# Patient Record
Sex: Male | Born: 1950 | Race: Black or African American | Hispanic: No | Marital: Married | Smoking: Current every day smoker
Health system: Southern US, Community
[De-identification: ages and names within clinical notes are randomized; demographics above are authoritative.]

## PROBLEM LIST (undated history)

## (undated) DIAGNOSIS — M109 Gout, unspecified: Secondary | ICD-10-CM

## (undated) DIAGNOSIS — I10 Essential (primary) hypertension: Secondary | ICD-10-CM

## (undated) DIAGNOSIS — E119 Type 2 diabetes mellitus without complications: Secondary | ICD-10-CM

## (undated) HISTORY — DX: Gout, unspecified: M10.9

## (undated) HISTORY — DX: Type 2 diabetes mellitus without complications: E11.9

## (undated) HISTORY — PX: OTHER SURGICAL HISTORY: SHX169

---

## 2008-04-05 HISTORY — PX: CAROTID STENT: SHX1301

## 2009-10-14 ENCOUNTER — Ambulatory Visit: Payer: Self-pay | Admitting: Urology

## 2009-10-27 ENCOUNTER — Ambulatory Visit: Payer: Self-pay | Admitting: Urology

## 2010-08-27 ENCOUNTER — Ambulatory Visit: Payer: Self-pay | Admitting: Gastroenterology

## 2010-09-02 ENCOUNTER — Ambulatory Visit: Payer: Self-pay | Admitting: Gastroenterology

## 2010-10-22 ENCOUNTER — Ambulatory Visit: Payer: Self-pay

## 2010-11-22 ENCOUNTER — Emergency Department: Payer: Self-pay | Admitting: *Deleted

## 2011-08-12 ENCOUNTER — Ambulatory Visit: Payer: Self-pay | Admitting: Internal Medicine

## 2011-10-12 ENCOUNTER — Ambulatory Visit: Payer: Self-pay | Admitting: Specialist

## 2012-03-07 ENCOUNTER — Ambulatory Visit: Payer: Self-pay | Admitting: Specialist

## 2012-03-15 ENCOUNTER — Ambulatory Visit: Payer: Self-pay | Admitting: Specialist

## 2014-02-04 ENCOUNTER — Ambulatory Visit: Payer: Self-pay

## 2014-03-27 DIAGNOSIS — M179 Osteoarthritis of knee, unspecified: Secondary | ICD-10-CM | POA: Insufficient documentation

## 2014-03-27 DIAGNOSIS — M1710 Unilateral primary osteoarthritis, unspecified knee: Secondary | ICD-10-CM | POA: Insufficient documentation

## 2014-04-30 ENCOUNTER — Ambulatory Visit: Payer: Self-pay | Admitting: Urgent Care

## 2014-04-30 LAB — CBC WITH DIFFERENTIAL/PLATELET
BASOS ABS: 0 10*3/uL (ref 0.0–0.1)
Basophil %: 0.4 %
EOS ABS: 0.1 10*3/uL (ref 0.0–0.7)
Eosinophil %: 1.4 %
HCT: 39.6 % — ABNORMAL LOW (ref 40.0–52.0)
HGB: 13 g/dL (ref 13.0–18.0)
LYMPHS PCT: 25.5 %
Lymphocyte #: 1.8 10*3/uL (ref 1.0–3.6)
MCH: 30.8 pg (ref 26.0–34.0)
MCHC: 32.8 g/dL (ref 32.0–36.0)
MCV: 94 fL (ref 80–100)
Monocyte #: 0.6 x10 3/mm (ref 0.2–1.0)
Monocyte %: 8 %
NEUTROS ABS: 4.5 10*3/uL (ref 1.4–6.5)
NEUTROS PCT: 64.7 %
PLATELETS: 127 10*3/uL — AB (ref 150–440)
RBC: 4.21 10*6/uL — ABNORMAL LOW (ref 4.40–5.90)
RDW: 13.1 % (ref 11.5–14.5)
WBC: 7 10*3/uL (ref 3.8–10.6)

## 2014-04-30 LAB — HEPATIC FUNCTION PANEL A (ARMC)
ALBUMIN: 3.6 g/dL (ref 3.4–5.0)
ALK PHOS: 76 U/L (ref 46–116)
Bilirubin, Direct: 0.1 mg/dL (ref 0.0–0.2)
Bilirubin,Total: 0.8 mg/dL (ref 0.2–1.0)
SGOT(AST): 31 U/L (ref 15–37)
SGPT (ALT): 41 U/L (ref 14–63)
TOTAL PROTEIN: 7.3 g/dL (ref 6.4–8.2)

## 2014-04-30 LAB — PROTIME-INR
INR: 1
PROTHROMBIN TIME: 13 s (ref 11.5–14.7)

## 2014-04-30 LAB — LIPASE, BLOOD: Lipase: 79 U/L (ref 73–393)

## 2014-05-01 LAB — CANCER ANTIGEN 19-9: CA 19-9: 31 U/mL (ref 0–35)

## 2014-05-07 ENCOUNTER — Ambulatory Visit: Payer: Self-pay | Admitting: Urgent Care

## 2014-06-11 DIAGNOSIS — K219 Gastro-esophageal reflux disease without esophagitis: Secondary | ICD-10-CM | POA: Diagnosis not present

## 2014-06-11 DIAGNOSIS — D126 Benign neoplasm of colon, unspecified: Secondary | ICD-10-CM | POA: Diagnosis not present

## 2014-06-11 DIAGNOSIS — K862 Cyst of pancreas: Secondary | ICD-10-CM | POA: Diagnosis not present

## 2014-06-17 DIAGNOSIS — M25531 Pain in right wrist: Secondary | ICD-10-CM | POA: Diagnosis not present

## 2014-07-23 ENCOUNTER — Ambulatory Visit
Admit: 2014-07-23 | Disposition: A | Discharge status: Home / Self Care | Payer: Self-pay | Attending: Gastroenterology | Admitting: Gastroenterology

## 2014-07-23 DIAGNOSIS — I10 Essential (primary) hypertension: Secondary | ICD-10-CM | POA: Diagnosis not present

## 2014-07-23 DIAGNOSIS — K635 Polyp of colon: Secondary | ICD-10-CM | POA: Diagnosis not present

## 2014-07-23 DIAGNOSIS — K641 Second degree hemorrhoids: Secondary | ICD-10-CM | POA: Diagnosis not present

## 2014-07-23 DIAGNOSIS — D123 Benign neoplasm of transverse colon: Secondary | ICD-10-CM | POA: Diagnosis not present

## 2014-07-23 DIAGNOSIS — F17210 Nicotine dependence, cigarettes, uncomplicated: Secondary | ICD-10-CM | POA: Diagnosis not present

## 2014-07-23 DIAGNOSIS — D125 Benign neoplasm of sigmoid colon: Secondary | ICD-10-CM | POA: Diagnosis not present

## 2014-07-23 DIAGNOSIS — K219 Gastro-esophageal reflux disease without esophagitis: Secondary | ICD-10-CM | POA: Diagnosis not present

## 2014-07-23 DIAGNOSIS — D120 Benign neoplasm of cecum: Secondary | ICD-10-CM | POA: Diagnosis not present

## 2014-07-23 DIAGNOSIS — E119 Type 2 diabetes mellitus without complications: Secondary | ICD-10-CM | POA: Diagnosis not present

## 2014-07-23 DIAGNOSIS — R12 Heartburn: Secondary | ICD-10-CM | POA: Diagnosis not present

## 2014-07-23 DIAGNOSIS — Z86010 Personal history of colonic polyps: Secondary | ICD-10-CM | POA: Diagnosis not present

## 2014-07-23 NOTE — Op Note (Signed)
PATIENT NAME:  Donald Scott, Donald Scott MR#:  [id number] DATE OF BIRTH:  05-Aug-1950  DATE OF PROCEDURE:  03/15/2012  PREOPERATIVE DIAGNOSES:  1. Severe impingement syndrome, right shoulder.  2. Severe right acromioclavicular arthritis. 3. Thinning of the rotator cuff. 4. Fraying of the glenoid labrum upper.   POSTOPERATIVE DIAGNOSES:  1. Severe impingement syndrome, right shoulder.  2. Severe right acromioclavicular arthritis. 3. Thinning of the rotator cuff. 4. Fraying of the glenoid labrum upper.   PROCEDURES: 1. Arthroscopic right distal clavicle excision.  2. Arthroscopic subacromial decompression and partial acromioplasty and extensive bursectomy.  3. Debridement of glenoid labrum   SURGEON: Earnestine Leys, M.D.   ANESTHESIA: General endotracheal.   COMPLICATIONS: None.   DRAINS: None.   ESTIMATED BLOOD LOSS: Minimal.   REPLACEMENTS: None.   OPERATIVE FINDINGS: The patient had very thick advanced bursitis in the subacromial bursa. He had very prominent anterior downsloping, anterior acromion, and very prominent spurs off the undersurface of the clavicle. There was thinning of the rotator cuff from the dorsal surface. From the volar surface, there was liftoff of about 2 to 3 mm of the cuff from the glenoid margin, but no fraying of the cuff at all, no retraction. The biceps tendon was intact. The glenohumeral joint was intact. The labrum was frayed in multiple places.   DESCRIPTION OF PROCEDURE: The patient was brought to the Operating Room where he underwent satisfactory general endotracheal anesthesia, in the supine position. The patient refused an interscalene block. He was turned in the left lateral decubitus position and padded appropriately on the beanbag. Arthroscopy was carried out from a posterior portal with accessory portals laterally and anteriorly. The above findings were encountered upon entering the subacromial joint, which was debrided through the lateral portal with a  motorized shaver. The cuff was seen to be thinned on the dorsal surface, but there was no obvious tear. The arthroscope was redirected into the glenohumeral joint and the above findings were noted. There was slight exposure of the tuberosity bone just lateral to the articular surface of 2 to 3 mm, but there did not appear to be any fraying or significant damage to the tendon otherwise.Long head of the biceps was intact. The spinal needle was passed through this area to visualize it and there was no obvious tear. The only area of weakness and thinning was way anterior of the biceps. I decided not to do any further treatment to the cuff itself as a result. With the arthroscope in the subacromial space, the soft tissue was removed from the undersurface of the acromion and the clavicle. Coracoacromial ligament was released. The bur was introduced from the posterior portal and anterior acromioplasty carried out along and from the anterior portal the bur was used to remove the distal 8 to 10 mm of the clavicle. Extensive bursectomy and soft tissue debridement was carried out. Once this was all complete, the space was widely opened and the cuff remained intact. Thorough wash-out was carried out. Instruments were removed and stab wounds were closed with 3-0 nylon suture. 0.5% Marcaine with epinephrine and morphine was placed in the joint, in the bursa. TENS pads and a dry sterile dressing were applied. A sling was applied. The patient was awakened and taken to recovery in good condition.  ____________________________ Park Breed, MD hem:slb D: 03/15/2012 14:06:58 ET T: 03/15/2012 14:36:52 ET JOB#: [id number]  cc: Park Breed, MD, <Dictator> Park Breed MD ELECTRONICALLY SIGNED 03/15/2012 16:39

## 2014-07-29 LAB — SURGICAL PATHOLOGY

## 2014-08-22 DIAGNOSIS — R531 Weakness: Secondary | ICD-10-CM | POA: Diagnosis not present

## 2014-08-22 DIAGNOSIS — M25511 Pain in right shoulder: Secondary | ICD-10-CM | POA: Diagnosis not present

## 2014-08-22 DIAGNOSIS — I10 Essential (primary) hypertension: Secondary | ICD-10-CM | POA: Diagnosis not present

## 2014-08-22 DIAGNOSIS — M79601 Pain in right arm: Secondary | ICD-10-CM | POA: Diagnosis not present

## 2014-08-22 DIAGNOSIS — E119 Type 2 diabetes mellitus without complications: Secondary | ICD-10-CM | POA: Diagnosis not present

## 2014-08-22 DIAGNOSIS — Z79891 Long term (current) use of opiate analgesic: Secondary | ICD-10-CM | POA: Diagnosis not present

## 2014-09-05 DIAGNOSIS — M79601 Pain in right arm: Secondary | ICD-10-CM | POA: Diagnosis not present

## 2014-09-08 ENCOUNTER — Encounter: Payer: Self-pay | Admitting: Urgent Care

## 2014-09-08 NOTE — Telephone Encounter (Signed)
A user error has taken place: encounter opened in error, closed for administrative reasons.

## 2014-09-12 DIAGNOSIS — I6529 Occlusion and stenosis of unspecified carotid artery: Secondary | ICD-10-CM | POA: Diagnosis not present

## 2014-09-23 DIAGNOSIS — Z6826 Body mass index (BMI) 26.0-26.9, adult: Secondary | ICD-10-CM | POA: Diagnosis not present

## 2014-09-23 DIAGNOSIS — F17200 Nicotine dependence, unspecified, uncomplicated: Secondary | ICD-10-CM | POA: Diagnosis not present

## 2014-09-23 DIAGNOSIS — Z820 Family history of epilepsy and other diseases of the nervous system: Secondary | ICD-10-CM | POA: Diagnosis not present

## 2014-09-23 DIAGNOSIS — K219 Gastro-esophageal reflux disease without esophagitis: Secondary | ICD-10-CM | POA: Diagnosis not present

## 2014-09-23 DIAGNOSIS — M25511 Pain in right shoulder: Secondary | ICD-10-CM | POA: Diagnosis not present

## 2014-09-23 DIAGNOSIS — E119 Type 2 diabetes mellitus without complications: Secondary | ICD-10-CM | POA: Diagnosis not present

## 2014-09-23 DIAGNOSIS — Z9889 Other specified postprocedural states: Secondary | ICD-10-CM | POA: Diagnosis not present

## 2014-09-23 DIAGNOSIS — M79601 Pain in right arm: Secondary | ICD-10-CM | POA: Diagnosis not present

## 2014-09-23 DIAGNOSIS — I10 Essential (primary) hypertension: Secondary | ICD-10-CM | POA: Diagnosis not present

## 2014-09-23 DIAGNOSIS — M25531 Pain in right wrist: Secondary | ICD-10-CM | POA: Diagnosis not present

## 2014-09-26 DIAGNOSIS — F17210 Nicotine dependence, cigarettes, uncomplicated: Secondary | ICD-10-CM | POA: Diagnosis not present

## 2014-09-26 DIAGNOSIS — M25511 Pain in right shoulder: Secondary | ICD-10-CM | POA: Diagnosis not present

## 2014-09-26 DIAGNOSIS — I6529 Occlusion and stenosis of unspecified carotid artery: Secondary | ICD-10-CM | POA: Diagnosis not present

## 2014-09-26 DIAGNOSIS — M79601 Pain in right arm: Secondary | ICD-10-CM | POA: Diagnosis not present

## 2014-09-26 DIAGNOSIS — I10 Essential (primary) hypertension: Secondary | ICD-10-CM | POA: Diagnosis not present

## 2014-10-21 DIAGNOSIS — M25511 Pain in right shoulder: Secondary | ICD-10-CM | POA: Diagnosis not present

## 2014-10-21 DIAGNOSIS — M5410 Radiculopathy, site unspecified: Secondary | ICD-10-CM | POA: Diagnosis not present

## 2014-10-21 DIAGNOSIS — G549 Nerve root and plexus disorder, unspecified: Secondary | ICD-10-CM | POA: Insufficient documentation

## 2014-12-30 DIAGNOSIS — M79601 Pain in right arm: Secondary | ICD-10-CM | POA: Diagnosis not present

## 2014-12-30 DIAGNOSIS — Z23 Encounter for immunization: Secondary | ICD-10-CM | POA: Diagnosis not present

## 2014-12-30 DIAGNOSIS — K861 Other chronic pancreatitis: Secondary | ICD-10-CM | POA: Diagnosis not present

## 2014-12-30 DIAGNOSIS — I10 Essential (primary) hypertension: Secondary | ICD-10-CM | POA: Diagnosis not present

## 2014-12-30 DIAGNOSIS — F17210 Nicotine dependence, cigarettes, uncomplicated: Secondary | ICD-10-CM | POA: Diagnosis not present

## 2014-12-30 DIAGNOSIS — E119 Type 2 diabetes mellitus without complications: Secondary | ICD-10-CM | POA: Diagnosis not present

## 2014-12-30 DIAGNOSIS — M25511 Pain in right shoulder: Secondary | ICD-10-CM | POA: Diagnosis not present

## 2015-02-13 DIAGNOSIS — E119 Type 2 diabetes mellitus without complications: Secondary | ICD-10-CM | POA: Diagnosis not present

## 2015-04-01 DIAGNOSIS — E119 Type 2 diabetes mellitus without complications: Secondary | ICD-10-CM | POA: Diagnosis not present

## 2015-04-01 DIAGNOSIS — I10 Essential (primary) hypertension: Secondary | ICD-10-CM | POA: Diagnosis not present

## 2015-04-01 DIAGNOSIS — K861 Other chronic pancreatitis: Secondary | ICD-10-CM | POA: Diagnosis not present

## 2015-04-01 DIAGNOSIS — Z0001 Encounter for general adult medical examination with abnormal findings: Secondary | ICD-10-CM | POA: Diagnosis not present

## 2015-04-01 DIAGNOSIS — J019 Acute sinusitis, unspecified: Secondary | ICD-10-CM | POA: Diagnosis not present

## 2015-04-01 DIAGNOSIS — F17210 Nicotine dependence, cigarettes, uncomplicated: Secondary | ICD-10-CM | POA: Diagnosis not present

## 2015-05-01 ENCOUNTER — Ambulatory Visit: Payer: Self-pay | Admitting: Gastroenterology

## 2015-05-21 ENCOUNTER — Emergency Department
Admission: EM | Admit: 2015-05-21 | Discharge: 2015-05-21 | Disposition: A | Discharge status: Home / Self Care | Payer: Commercial Managed Care - HMO | Attending: Emergency Medicine | Admitting: Emergency Medicine

## 2015-05-21 ENCOUNTER — Emergency Department: Payer: Commercial Managed Care - HMO

## 2015-05-21 ENCOUNTER — Encounter: Payer: Self-pay | Admitting: *Deleted

## 2015-05-21 DIAGNOSIS — E119 Type 2 diabetes mellitus without complications: Secondary | ICD-10-CM | POA: Diagnosis not present

## 2015-05-21 DIAGNOSIS — Z880 Allergy status to penicillin: Secondary | ICD-10-CM | POA: Insufficient documentation

## 2015-05-21 DIAGNOSIS — M79675 Pain in left toe(s): Secondary | ICD-10-CM | POA: Diagnosis not present

## 2015-05-21 DIAGNOSIS — M109 Gout, unspecified: Secondary | ICD-10-CM

## 2015-05-21 DIAGNOSIS — F17200 Nicotine dependence, unspecified, uncomplicated: Secondary | ICD-10-CM | POA: Insufficient documentation

## 2015-05-21 DIAGNOSIS — M10072 Idiopathic gout, left ankle and foot: Secondary | ICD-10-CM | POA: Insufficient documentation

## 2015-05-21 DIAGNOSIS — I10 Essential (primary) hypertension: Secondary | ICD-10-CM | POA: Diagnosis not present

## 2015-05-21 DIAGNOSIS — M79672 Pain in left foot: Secondary | ICD-10-CM | POA: Diagnosis present

## 2015-05-21 HISTORY — DX: Essential (primary) hypertension: I10

## 2015-05-21 MED ORDER — ETODOLAC 300 MG PO CAPS: 300.0000 mg | ORAL_CAPSULE | Freq: Three times a day (TID) | ORAL | Status: DC | PRN

## 2015-05-21 NOTE — ED Notes (Signed)
States left big toe pain, states he injured it several months ago and now feels like its dislocated, pt ambualtory to triage room

## 2015-05-21 NOTE — Discharge Instructions (Signed)

## 2015-05-21 NOTE — ED Provider Notes (Signed)
[facility] Emergency Department Provider Note  ____________________________________________  Time seen: Approximately 10:14 AM  I have reviewed the triage vital signs and the nursing notes.   HISTORY  Chief Complaint Foot Pain    HPI Donald Scott. is a 65 y.o. male, NAD, presents emergency Department with a few day history of pain in the left great toe. Notes some redness and swelling. Denies any recent injuries but is uncertain if he stubbed the toe some months ago. Has a history of gout but normally only presents in the right great toe. Denies any skin sores or open wounds. Has history of diabetes but states since he's lost a significant amount of weight he is no longer on medications. Denies numbness, weakness, tingling of the feet. Has not taken anything over-the-counter for his current symptoms.   Past Medical History  Diagnosis Date  . Hypertension     There are no active problems to display for this patient.   History reviewed. No pertinent past surgical history.  No current outpatient prescriptions on file.  Allergies Penicillins  History reviewed. No pertinent family history.  Social History Social History  Substance Use Topics  . Smoking status: Current Every Day Smoker  . Smokeless tobacco: None  . Alcohol Use: None     Review of Systems  Constitutional: No fever/chills, fatigue.  Eyes: No visual changes.  Cardiovascular: No chest pain. Respiratory: No cough. No shortness of breath. No wheezing.  Gastrointestinal: No abdominal pain.  No nausea, vomiting. Musculoskeletal: Positive for left great toe pain. Negative for back pain.  Skin: Positive for redness and swelling of left great toe. Negative for rash. Neurological: Negative for headaches, focal weakness or numbness. 10-point ROS otherwise negative.  ____________________________________________   PHYSICAL EXAM:  VITAL SIGNS: ED Triage Vitals  Enc Vitals Group     BP 05/21/15 0943 192/93 mmHg     Pulse Rate 05/21/15 0943 78     Resp 05/21/15 0943 18     Temp 05/21/15 0943 98 F (36.7 C)     Temp Source 05/21/15 0943 Oral     SpO2 05/21/15 0943 99 %     Weight 05/21/15 0943 160 lb (72.576 kg)     Height 05/21/15 0943 5\' 7"  (1.702 m)     Head Cir --      Peak Flow --      Pain Score 05/21/15 0944 8     Pain Loc --      Pain Edu? --      Excl. in [place]? --     Constitutional: Alert and oriented. Well appearing and in no acute distress. Eyes: Conjunctivae are normal.  Head: Atraumatic. Cardiovascular: Normal rate, regular rhythm. Normal S1 and S2.  Good peripheral circulation. Respiratory: Normal respiratory effort without tachypnea or retractions. Lungs CTAB. Musculoskeletal: Pain to palpation about the left right toe MTP. No lower extremity edema.  No joint effusions. Neurologic:  Normal speech and language. No gross focal neurologic deficits are appreciated.  Skin:  Skin about the left great toe MTP erythematous, swollen, warm. No skin lesions noted. No open wounds, oozing, weeping.  Psychiatric: Mood and affect are normal. Speech and behavior are normal. Patient exhibits appropriate insight and judgement.   ____________________________________________   LABS  None   ____________________________________________  EKG  None ____________________________________________  RADIOLOGY I have personally viewed and evaluated these images (plain radiographs) as part of my medical decision making, as well as reviewing the written report by the radiologist.  Dg Toe Great Left  05/21/2015  CLINICAL DATA:  Left great toe pain.  Stubbed toe 3-4 months ago. EXAM: LEFT GREAT TOE COMPARISON:  None. FINDINGS: There is no evidence of fracture or dislocation. There is mild osteoarthritis of the first MTP joint. Soft tissues are unremarkable. IMPRESSION: No acute osseous injury of the left great toe. Electronically Signed   By: Kathreen Devoid   On:  05/21/2015 10:41    ____________________________________________    PROCEDURES  Procedure(s) performed: None    Medications - No data to display   ____________________________________________   INITIAL IMPRESSION / ASSESSMENT AND PLAN / ED COURSE  Pertinent imaging results that were available during my care of the patient were reviewed by me and considered in my medical decision making (see chart for details).  Patient's diagnosis is consistent with acute gouty arthritis. Patient will be discharged home with prescriptions for etodolac 300 mg capsules take 1 capsule by mouth 3 times daily with meals as needed for inflammation and pain. Would've liked to have given colchicine and indomethacin but unfortunately those medications were not covered by his current insurance plan. Patient has lab orders to take to lab for draw for his PCP. Advise the patient to give a call to his primary care provider to add uric acid for recheck. Patient is to follow up with her memory care provider if symptoms persist past this treatment course. Patient is given ED precautions to return to the ED for any worsening or new symptoms.    ____________________________________________  FINAL CLINICAL IMPRESSION(S) / ED DIAGNOSES  Final diagnoses:  Acute gout of left foot, unspecified cause      NEW MEDICATIONS STARTED DURING THIS VISIT:  New Prescriptions   No medications on file         Braxton Feathers, PA-C 05/21/15 1132  Eula Listen, MD 05/21/15 1541

## 2015-05-22 ENCOUNTER — Encounter: Payer: Self-pay | Admitting: Gastroenterology

## 2015-05-22 ENCOUNTER — Ambulatory Visit (INDEPENDENT_AMBULATORY_CARE_PROVIDER_SITE_OTHER): Payer: Commercial Managed Care - HMO | Admitting: Gastroenterology

## 2015-05-22 ENCOUNTER — Other Ambulatory Visit: Payer: Self-pay

## 2015-05-22 VITALS — BP 162/95 | HR 66 | Temp 98.4°F | Ht 67.0 in | Wt 173.0 lb

## 2015-05-22 DIAGNOSIS — M109 Gout, unspecified: Secondary | ICD-10-CM

## 2015-05-22 DIAGNOSIS — I10 Essential (primary) hypertension: Secondary | ICD-10-CM | POA: Insufficient documentation

## 2015-05-22 DIAGNOSIS — E119 Type 2 diabetes mellitus without complications: Secondary | ICD-10-CM

## 2015-05-22 DIAGNOSIS — M1000 Idiopathic gout, unspecified site: Secondary | ICD-10-CM

## 2015-05-22 DIAGNOSIS — E1121 Type 2 diabetes mellitus with diabetic nephropathy: Secondary | ICD-10-CM | POA: Insufficient documentation

## 2015-05-22 DIAGNOSIS — K861 Other chronic pancreatitis: Secondary | ICD-10-CM

## 2015-05-22 HISTORY — DX: Type 2 diabetes mellitus without complications: E11.9

## 2015-05-22 HISTORY — DX: Gout, unspecified: M10.9

## 2015-05-22 NOTE — Progress Notes (Signed)
Primary Care Physician: Ronnell Freshwater, NP  Primary Gastroenterologist:  Dr. Lucilla Lame  Chief Complaint  Patient presents with  . Follow up pancreatitis    HPI: Donald Scott. is a 65 y.o. male here with a history of chronic pancreatitis. 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 The patient now reports that he drinks approximately a 12 pack of beer a week. He was taking Creon 3 times a day but states he was getting constipated so he went down to twice a day. Since then the patient states he has been having 4 formed stools a day without any diarrhea. The patient denies any abdominal pain.  Current Outpatient Prescriptions  Medication Sig Dispense Refill  . amLODipine (NORVASC) 5 MG tablet     . aspirin 81 MG tablet Take by mouth.    . bisoprolol-hydrochlorothiazide (ZIAC) 5-6.25 MG tablet TK 1 T PO QD  5  . carvedilol (COREG) 6.25 MG tablet Take by mouth.    . enalapril (VASOTEC) 20 MG tablet Take by mouth.    . etodolac (LODINE) 300 MG capsule Take 1 capsule (300 mg total) by mouth 3 (three) times daily with meals as needed for moderate pain. 21 capsule 0  . fluticasone (FLONASE) 50 MCG/ACT nasal spray SHAKE LQ AND U 1 TO 2 SPRAYS IEN D FOR RHINITIS  1  . gabapentin (NEURONTIN) 300 MG capsule Take by mouth.    . insulin NPH-regular Human (HUMULIN 70/30) (70-30) 100 UNIT/ML injection     . lipase/protease/amylase (CREON) 36000 UNITS CPEP capsule     . losartan (COZAAR) 25 MG tablet TK 1 T PO QHS FOR BP  4  . metFORMIN (GLUCOPHAGE) 500 MG tablet Take by mouth.    . nabumetone (RELAFEN) 500 MG tablet     . NIFEdipine (AFEDITAB CR) 30 MG 24 hr tablet Take by mouth.    Marland Kitchen NIFEdipine (PROCARDIA-XL/ADALAT-CC/NIFEDICAL-XL) 30 MG 24 hr tablet     . nortriptyline (PAMELOR) 25 MG capsule     . omeprazole (PRILOSEC) 40 MG capsule     . HYDROcodone-acetaminophen (VICODIN) 5-500 MG tablet Reported on 05/22/2015    .  oxyCODONE-acetaminophen (PERCOCET/ROXICET) 5-325 MG tablet Reported on 05/22/2015     No current facility-administered medications for this visit.    Allergies as of 05/22/2015 - Review Complete 05/22/2015  Allergen Reaction Noted  . Iodinated diagnostic agents Other (See Comments) 05/22/2015  . Other Hives, Other (See Comments), and Rash 05/22/2015  . Penicillin g Other (See Comments) 05/22/2015  . Penicillins  05/21/2015    ROS:  General: Negative for anorexia, weight loss, fever, chills, fatigue, weakness. ENT: Negative for hoarseness, difficulty swallowing , nasal congestion. CV: Negative for chest pain, angina, palpitations, dyspnea on exertion, peripheral edema.  Respiratory: Negative for dyspnea at rest, dyspnea on exertion, cough, sputum, wheezing.  GI: See history of present illness. GU:  Negative for dysuria, hematuria, urinary incontinence, urinary frequency, nocturnal urination.  Endo: Negative for unusual weight change.    Physical Examination:   BP 162/95 mmHg  Pulse 66  Temp(Src) 98.4 F (36.9 C) (Oral)  Ht 5\' 7"  (1.702 m)  Wt 173 lb (78.472 kg)  BMI 27.09 kg/m2  General: Well-nourished, well-developed in no acute distress.  Eyes: No icterus. Conjunctivae pink. Mouth: Oropharyngeal mucosa moist and pink , no lesions erythema or exudate. Lungs: Clear to auscultation bilaterally. Non-labored. Heart: Regular rate and rhythm, no murmurs rubs or gallops.  Abdomen: Bowel sounds  are normal, nontender, nondistended, no hepatosplenomegaly or masses, no abdominal bruits or hernia , no rebound or guarding.   Extremities: No lower extremity edema. No clubbing or deformities. Neuro: Alert and oriented x 3.  Grossly intact. Skin: Warm and dry, no jaundice.   Psych: Alert and cooperative, normal mood and affect.  Labs:    Imaging Studies: Dg Toe Great Left  05/21/2015  CLINICAL DATA:  Left great toe pain.  Stubbed toe 3-4 months ago. EXAM: LEFT GREAT TOE COMPARISON:   None. FINDINGS: There is no evidence of fracture or dislocation. There is mild osteoarthritis of the first MTP joint. Soft tissues are unremarkable. IMPRESSION: No acute osseous injury of the left great toe. Electronically Signed   By: Kathreen Devoid   On: 05/21/2015 10:41    Assessment and Plan:   Andres Kawczynski. is a 65 y.o. y/o male who has a history of chronic pancreatitis. The patient has been using his Creon as a laxatives so that when he gets constipated he takes a lower dose of his Creon. The patient has been given samples of Zepep at 40,000 units of lipase. He has been told to try this to see if this makes his frequent bowel movements less. He has also been told to decrease his alcohol intake. The patient has been told to take MiraLAX if he gets constipated instead of cutting down his pancreatic enzyme dose. The patient will contact us if he has any further problems or needs a refill of his medications.   Note: This dictation was prepared with Dragon dictation along with smaller phrase technology. Any transcriptional errors that result from this process are unintentional.

## 2015-07-29 DIAGNOSIS — Z1159 Encounter for screening for other viral diseases: Secondary | ICD-10-CM | POA: Diagnosis not present

## 2015-07-29 DIAGNOSIS — Z0001 Encounter for general adult medical examination with abnormal findings: Secondary | ICD-10-CM | POA: Diagnosis not present

## 2015-07-29 DIAGNOSIS — R7301 Impaired fasting glucose: Secondary | ICD-10-CM | POA: Diagnosis not present

## 2015-07-29 DIAGNOSIS — E782 Mixed hyperlipidemia: Secondary | ICD-10-CM | POA: Diagnosis not present

## 2015-07-29 DIAGNOSIS — I10 Essential (primary) hypertension: Secondary | ICD-10-CM | POA: Diagnosis not present

## 2015-07-31 DIAGNOSIS — E119 Type 2 diabetes mellitus without complications: Secondary | ICD-10-CM | POA: Diagnosis not present

## 2015-07-31 DIAGNOSIS — M79641 Pain in right hand: Secondary | ICD-10-CM | POA: Diagnosis not present

## 2015-07-31 DIAGNOSIS — F17210 Nicotine dependence, cigarettes, uncomplicated: Secondary | ICD-10-CM | POA: Diagnosis not present

## 2015-07-31 DIAGNOSIS — L03113 Cellulitis of right upper limb: Secondary | ICD-10-CM | POA: Diagnosis not present

## 2015-07-31 DIAGNOSIS — I10 Essential (primary) hypertension: Secondary | ICD-10-CM | POA: Diagnosis not present

## 2015-07-31 DIAGNOSIS — M12541 Traumatic arthropathy, right hand: Secondary | ICD-10-CM | POA: Diagnosis not present

## 2015-11-27 DIAGNOSIS — F17210 Nicotine dependence, cigarettes, uncomplicated: Secondary | ICD-10-CM | POA: Diagnosis not present

## 2015-11-27 DIAGNOSIS — E119 Type 2 diabetes mellitus without complications: Secondary | ICD-10-CM | POA: Diagnosis not present

## 2015-11-27 DIAGNOSIS — K861 Other chronic pancreatitis: Secondary | ICD-10-CM | POA: Diagnosis not present

## 2015-11-27 DIAGNOSIS — J393 Upper respiratory tract hypersensitivity reaction, site unspecified: Secondary | ICD-10-CM | POA: Diagnosis not present

## 2015-11-27 DIAGNOSIS — I10 Essential (primary) hypertension: Secondary | ICD-10-CM | POA: Diagnosis not present

## 2015-11-27 DIAGNOSIS — R079 Chest pain, unspecified: Secondary | ICD-10-CM | POA: Diagnosis not present

## 2016-01-31 IMAGING — MR MR ABDOMEN WO/W CM MRCP
11 of 19 series · 27 of 48 positions shown · IV contrast (multihance)
Comparison: 09/02/2010

CLINICAL DATA: Abdominal pain.  History of pancreatitis.

EXAM:
MRI ABDOMEN WITHOUT AND WITH CONTRAST (INCLUDING MRCP)
TECHNIQUE: Multiplanar multisequence MR imaging of the abdomen was performed
both before and after the administration of intravenous contrast.
Heavily T2-weighted images of the biliary and pancreatic ducts were
obtained, and three-dimensional MRCP images were rendered by post
processing.
CONTRAST:  15 cc of MultiHance.

[Series 6: cor thins · coronal · 4.0mm · 0.89mm/px · 1 of 11 slices shown]
[im 1/11]
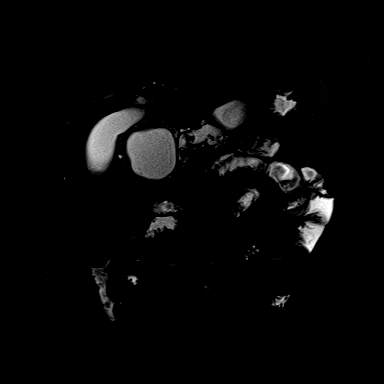

[Series 8: DWI · axial · 6.0mm · 1.98mm/px · z∈[-60,+149]mm · 3 of 90 slices shown]
[im 1/90]
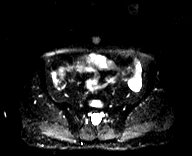
[im 45/90]
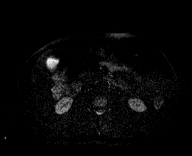
[im 90/90]
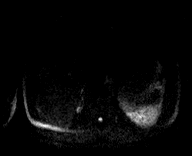

[Series 9: ax dwi_adc · axial · 6.0mm · 1.98mm/px · 1 of 30 slices shown]
[im 1/30]
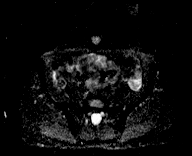

[Series 10: ax dual echo · axial · 8.0mm · 0.74mm/px · z∈[-42,+131]mm · 2 of 38 slices shown]
[im 1/38]
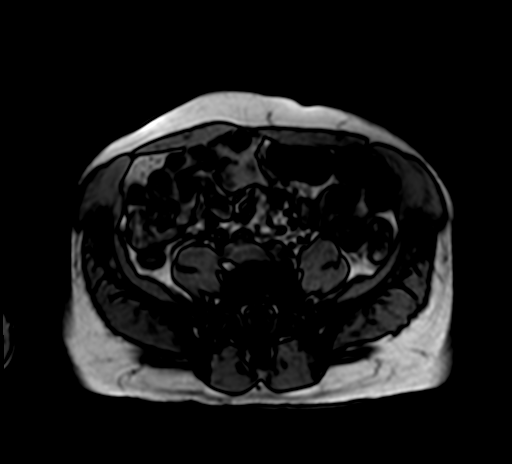
[im 38/38]
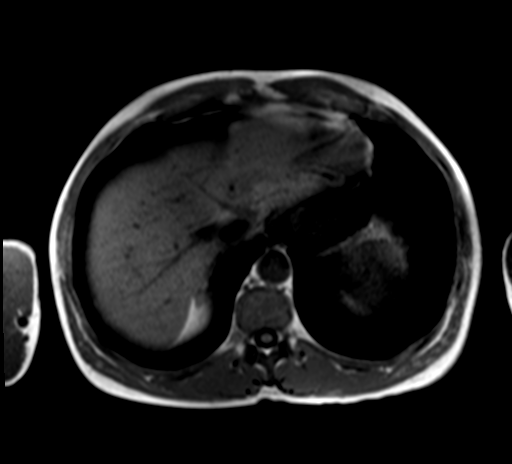

[Series 12: cor tru fisp · coronal · 4.0mm · 0.74mm/px · 2 of 40 slices shown]
[im 1/40]
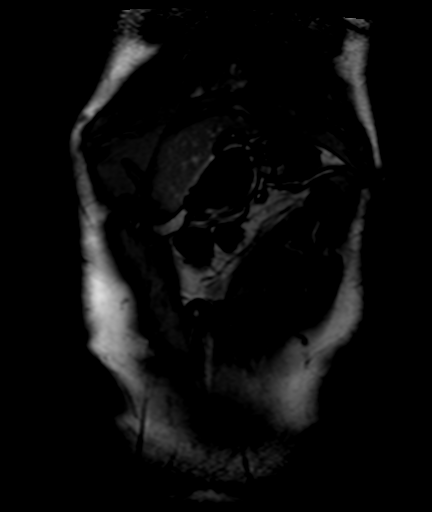
[im 40/40]
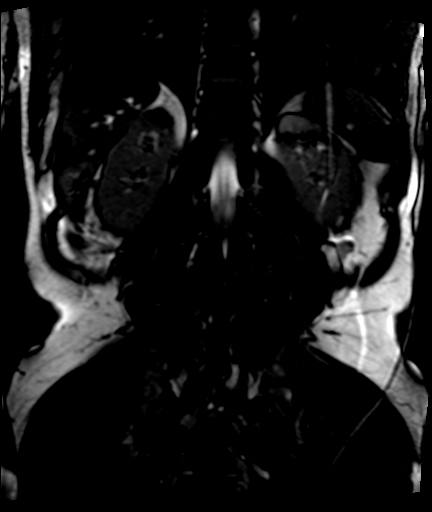

[Series 13: T2 · axial · 8.0mm · 0.74mm/px · 1 of 19 slices shown]
[im 1/19]
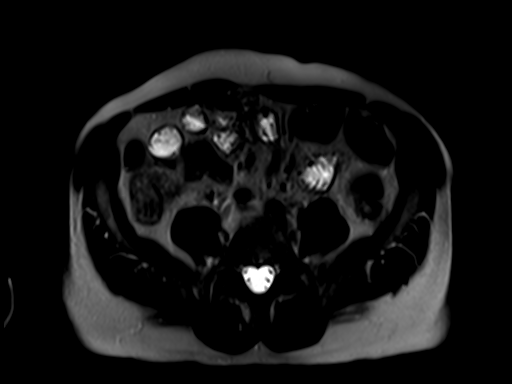

[Series 14: T1 dynamic fat-sat · axial · non-contrast · 2.5mm · 0.74mm/px · z∈[-34,+123]mm · 3 of 64 slices shown (1 of 2)]
[im 1/64]
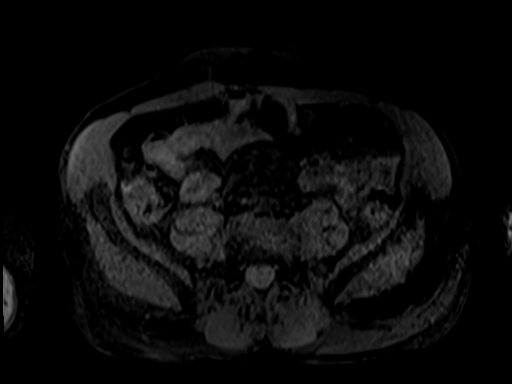
[im 32/64]
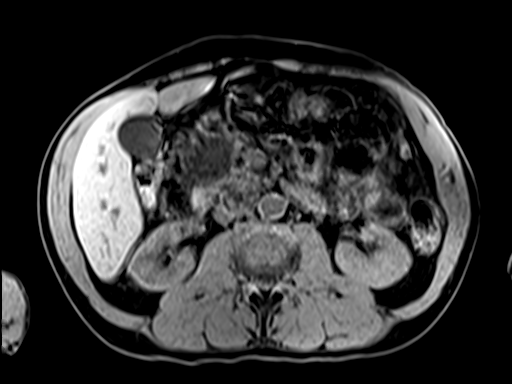
[im 64/64]
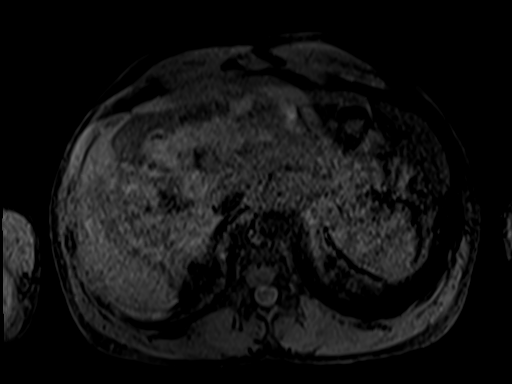

[Series 15: T1 dynamic fat-sat · axial · 2.5mm · 0.74mm/px · z∈[-44,+133]mm · 4 of 72 slices shown (2 of 2)]
[im 1/72]
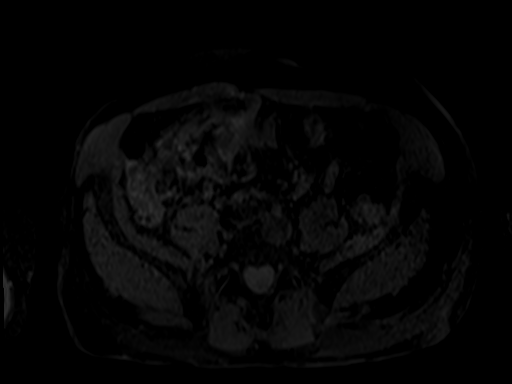
[im 24/72]
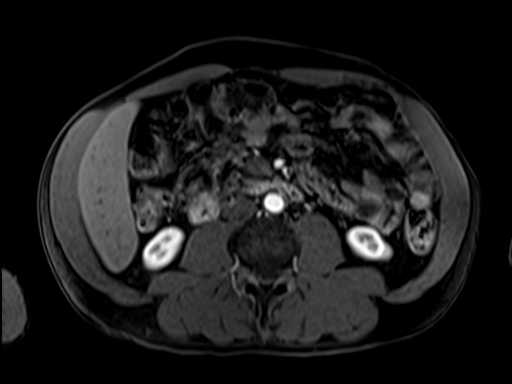
[im 48/72]
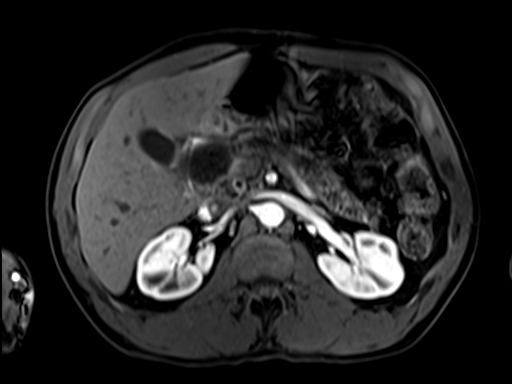
[im 72/72]
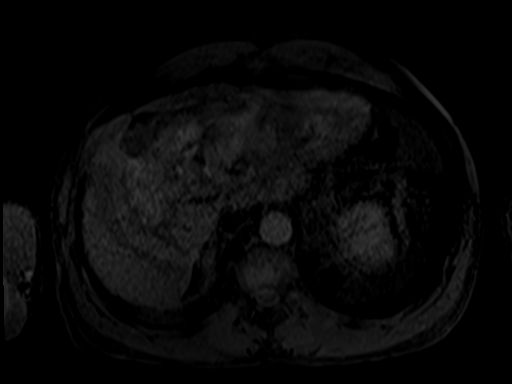

[Series 16: T1 dynamic fat-sat post-contrast · axial · 2.5mm · 0.74mm/px · z∈[-44,+133]mm · 4 of 72 slices shown (1 of 3)]
[im 1/72]
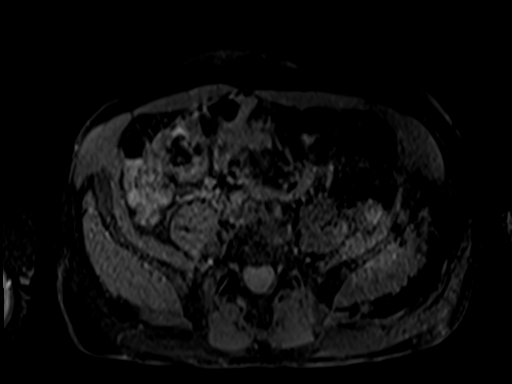
[im 24/72]
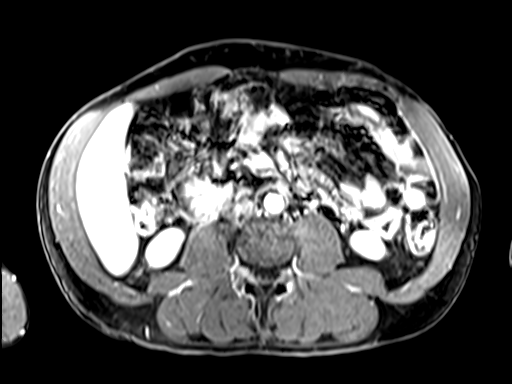
[im 48/72]
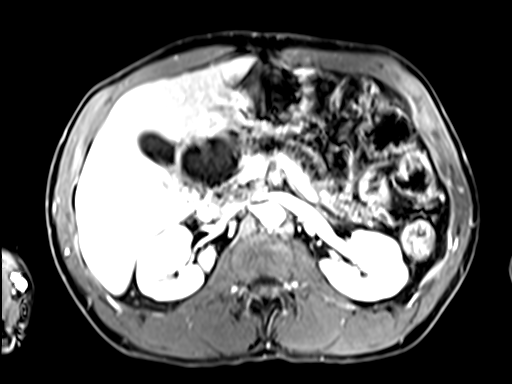
[im 72/72]
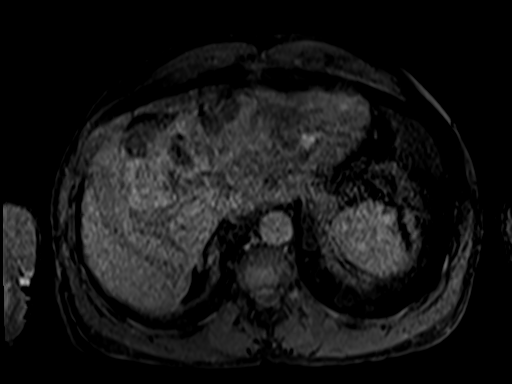

[Series 17: T1 dynamic fat-sat post-contrast · axial · 2.5mm · 0.74mm/px · z∈[-44,+133]mm · 4 of 72 slices shown (2 of 3)]
[im 1/72]
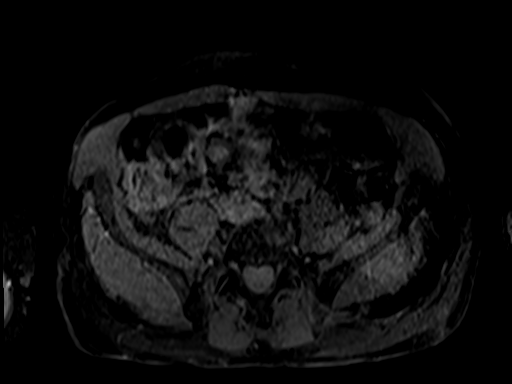
[im 24/72]
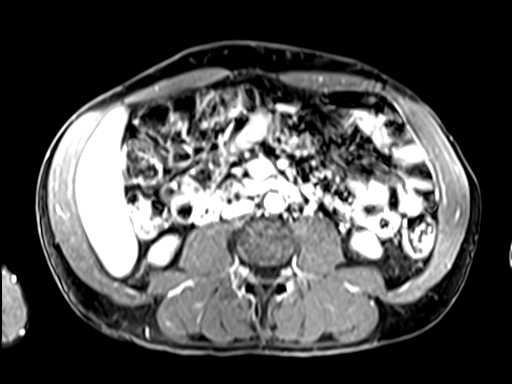
[im 48/72]
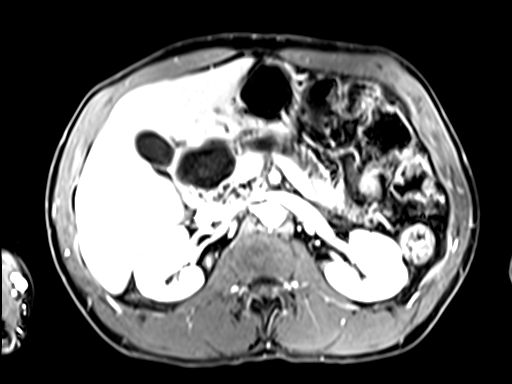
[im 72/72]
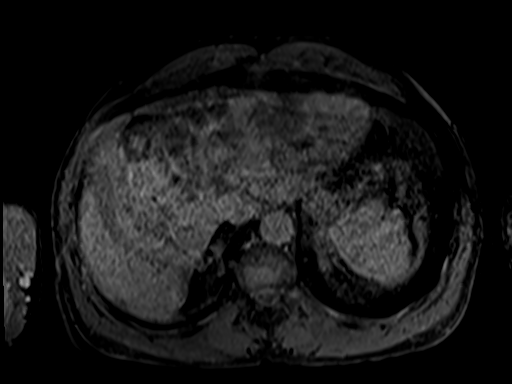

[Series 18: T1 dynamic fat-sat post-contrast · coronal · 2.5mm · 0.82mm/px · 2 of 72 slices shown (3 of 3)]
[im 1/72]
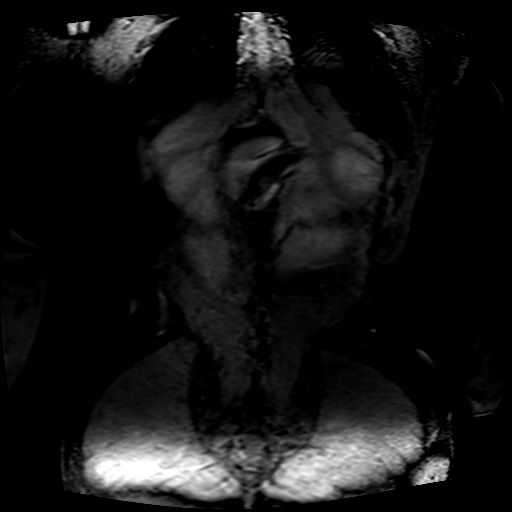
[im 24/72]
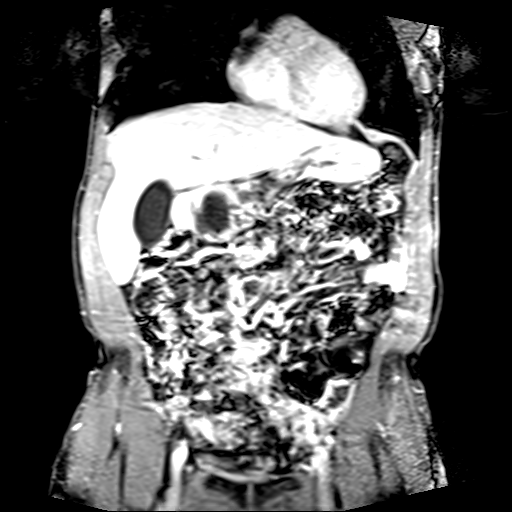

[27 of 48 positions shown; findings below may reference images not displayed]

FINDINGS: Lower chest: No pleural effusion identified. The lung bases are
clear.

Hepatobiliary: There are several T2 hyperintense structures within
both lobes of liver which likely represent benign hemangiomas. The
largest is in the left lobe measuring 6 mm. The gallbladder appears
normal. Mild intrahepatic biliary prominence. No common bile duct
dilatation identified.

Pancreas: Changes of chronic pancreatitis are identified. There is a
large pseudocyst within the head of pancreas measuring 4 x 4.8 cm.
Previously this measured 3 x 2 cm. The pancreatic duct is dilated
measuring up to 1.4 cm. Multiple areas of side branch duct ectasia
noted. Non enhancing filling defects within the pancreatic duct is
identified, image number 8/series 13. Likely calcifications.

Spleen: Normal appearance of the spleen.

Adrenals/Urinary Tract: The adrenal glands are normal. Several small
cysts noted in the left kidney.

Stomach/Bowel: The stomach and the visualized upper abdominal bowel
loops appear normal.

Vascular/Lymphatic: Normal caliber of the abdominal aorta. There is
no retroperitoneal adenopathy. No mesenteric adenopathy.

Other: No free fluid or fluid collections identified within the
abdomen or pelvis.

Musculoskeletal: No abnormal signal identified within the bone
marrow
IMPRESSION: 1. Advanced changes of chronic pancreatitis.
2. Large pseudocyst is identified within the head of pancreas. This
has increased in size when compared with exam from 09/02/2010.

## 2016-04-06 DIAGNOSIS — J393 Upper respiratory tract hypersensitivity reaction, site unspecified: Secondary | ICD-10-CM | POA: Diagnosis not present

## 2016-04-06 DIAGNOSIS — F17210 Nicotine dependence, cigarettes, uncomplicated: Secondary | ICD-10-CM | POA: Diagnosis not present

## 2016-04-06 DIAGNOSIS — E119 Type 2 diabetes mellitus without complications: Secondary | ICD-10-CM | POA: Diagnosis not present

## 2016-04-06 DIAGNOSIS — K861 Other chronic pancreatitis: Secondary | ICD-10-CM | POA: Diagnosis not present

## 2016-04-06 DIAGNOSIS — K219 Gastro-esophageal reflux disease without esophagitis: Secondary | ICD-10-CM | POA: Diagnosis not present

## 2016-04-06 DIAGNOSIS — I10 Essential (primary) hypertension: Secondary | ICD-10-CM | POA: Diagnosis not present

## 2016-04-06 DIAGNOSIS — Z0001 Encounter for general adult medical examination with abnormal findings: Secondary | ICD-10-CM | POA: Diagnosis not present

## 2016-05-06 ENCOUNTER — Other Ambulatory Visit: Payer: Self-pay | Admitting: Gastroenterology

## 2016-05-06 DIAGNOSIS — R197 Diarrhea, unspecified: Secondary | ICD-10-CM | POA: Diagnosis not present

## 2016-05-06 DIAGNOSIS — R1310 Dysphagia, unspecified: Secondary | ICD-10-CM | POA: Diagnosis not present

## 2016-05-06 DIAGNOSIS — K862 Cyst of pancreas: Secondary | ICD-10-CM | POA: Diagnosis not present

## 2016-05-06 DIAGNOSIS — K861 Other chronic pancreatitis: Secondary | ICD-10-CM | POA: Diagnosis not present

## 2016-05-06 DIAGNOSIS — K219 Gastro-esophageal reflux disease without esophagitis: Secondary | ICD-10-CM | POA: Diagnosis not present

## 2016-05-06 DIAGNOSIS — F1010 Alcohol abuse, uncomplicated: Secondary | ICD-10-CM | POA: Diagnosis not present

## 2016-05-07 ENCOUNTER — Other Ambulatory Visit: Payer: Self-pay | Admitting: Gastroenterology

## 2016-05-07 DIAGNOSIS — K862 Cyst of pancreas: Secondary | ICD-10-CM

## 2016-05-07 DIAGNOSIS — K861 Other chronic pancreatitis: Secondary | ICD-10-CM

## 2016-05-19 ENCOUNTER — Ambulatory Visit
Admission: RE | Admit: 2016-05-19 | Discharge: 2016-05-19 | Disposition: A | Discharge status: Home / Self Care | Payer: Medicare HMO | Source: Ambulatory Visit | Attending: Gastroenterology | Admitting: Gastroenterology

## 2016-05-19 ENCOUNTER — Other Ambulatory Visit: Payer: Self-pay | Admitting: Gastroenterology

## 2016-05-19 DIAGNOSIS — K862 Cyst of pancreas: Secondary | ICD-10-CM

## 2016-05-19 DIAGNOSIS — K831 Obstruction of bile duct: Secondary | ICD-10-CM | POA: Insufficient documentation

## 2016-05-19 DIAGNOSIS — K769 Liver disease, unspecified: Secondary | ICD-10-CM | POA: Insufficient documentation

## 2016-05-19 DIAGNOSIS — K861 Other chronic pancreatitis: Secondary | ICD-10-CM | POA: Diagnosis not present

## 2016-05-19 DIAGNOSIS — I2510 Atherosclerotic heart disease of native coronary artery without angina pectoris: Secondary | ICD-10-CM | POA: Insufficient documentation

## 2016-05-19 MED ORDER — GADOBENATE DIMEGLUMINE 529 MG/ML IV SOLN
16.0000 mL | Freq: Once | INTRAVENOUS | Status: AC | PRN
  Administered 2016-05-19: 16 mL via INTRAVENOUS

## 2016-06-14 DIAGNOSIS — K862 Cyst of pancreas: Secondary | ICD-10-CM | POA: Diagnosis not present

## 2016-06-14 DIAGNOSIS — K860 Alcohol-induced chronic pancreatitis: Secondary | ICD-10-CM | POA: Diagnosis not present

## 2016-06-14 DIAGNOSIS — E876 Hypokalemia: Secondary | ICD-10-CM | POA: Diagnosis not present

## 2016-06-14 DIAGNOSIS — R1310 Dysphagia, unspecified: Secondary | ICD-10-CM | POA: Diagnosis not present

## 2016-06-15 DIAGNOSIS — J069 Acute upper respiratory infection, unspecified: Secondary | ICD-10-CM | POA: Diagnosis not present

## 2016-06-15 DIAGNOSIS — F17210 Nicotine dependence, cigarettes, uncomplicated: Secondary | ICD-10-CM | POA: Diagnosis not present

## 2016-06-15 DIAGNOSIS — K861 Other chronic pancreatitis: Secondary | ICD-10-CM | POA: Diagnosis not present

## 2016-06-15 DIAGNOSIS — F1020 Alcohol dependence, uncomplicated: Secondary | ICD-10-CM | POA: Diagnosis not present

## 2016-06-15 DIAGNOSIS — R062 Wheezing: Secondary | ICD-10-CM | POA: Diagnosis not present

## 2016-06-15 DIAGNOSIS — I10 Essential (primary) hypertension: Secondary | ICD-10-CM | POA: Diagnosis not present

## 2016-08-09 DIAGNOSIS — M12562 Traumatic arthropathy, left knee: Secondary | ICD-10-CM | POA: Diagnosis not present

## 2016-08-09 DIAGNOSIS — I10 Essential (primary) hypertension: Secondary | ICD-10-CM | POA: Diagnosis not present

## 2016-08-09 DIAGNOSIS — E119 Type 2 diabetes mellitus without complications: Secondary | ICD-10-CM | POA: Diagnosis not present

## 2016-08-09 DIAGNOSIS — K861 Other chronic pancreatitis: Secondary | ICD-10-CM | POA: Diagnosis not present

## 2016-08-09 DIAGNOSIS — F17210 Nicotine dependence, cigarettes, uncomplicated: Secondary | ICD-10-CM | POA: Diagnosis not present

## 2016-08-09 DIAGNOSIS — I739 Peripheral vascular disease, unspecified: Secondary | ICD-10-CM | POA: Diagnosis not present

## 2016-08-18 DIAGNOSIS — I739 Peripheral vascular disease, unspecified: Secondary | ICD-10-CM | POA: Diagnosis not present

## 2016-12-09 DIAGNOSIS — E876 Hypokalemia: Secondary | ICD-10-CM | POA: Diagnosis not present

## 2016-12-09 DIAGNOSIS — I10 Essential (primary) hypertension: Secondary | ICD-10-CM | POA: Diagnosis not present

## 2016-12-09 DIAGNOSIS — K861 Other chronic pancreatitis: Secondary | ICD-10-CM | POA: Diagnosis not present

## 2016-12-09 DIAGNOSIS — K219 Gastro-esophageal reflux disease without esophagitis: Secondary | ICD-10-CM | POA: Diagnosis not present

## 2016-12-09 DIAGNOSIS — F17210 Nicotine dependence, cigarettes, uncomplicated: Secondary | ICD-10-CM | POA: Diagnosis not present

## 2016-12-09 DIAGNOSIS — E119 Type 2 diabetes mellitus without complications: Secondary | ICD-10-CM | POA: Diagnosis not present

## 2016-12-09 DIAGNOSIS — I739 Peripheral vascular disease, unspecified: Secondary | ICD-10-CM | POA: Diagnosis not present

## 2016-12-15 DIAGNOSIS — K860 Alcohol-induced chronic pancreatitis: Secondary | ICD-10-CM | POA: Diagnosis not present

## 2016-12-15 DIAGNOSIS — E876 Hypokalemia: Secondary | ICD-10-CM | POA: Diagnosis not present

## 2017-02-13 IMAGING — CR DG TOE GREAT 2+V*L*
3 series · 3 of 3 positions shown · non-contrast
Comparison: None.

CLINICAL DATA: Left great toe pain.  Stubbed toe 3-4 months ago.

EXAM:
LEFT GREAT TOE

[toe ap]
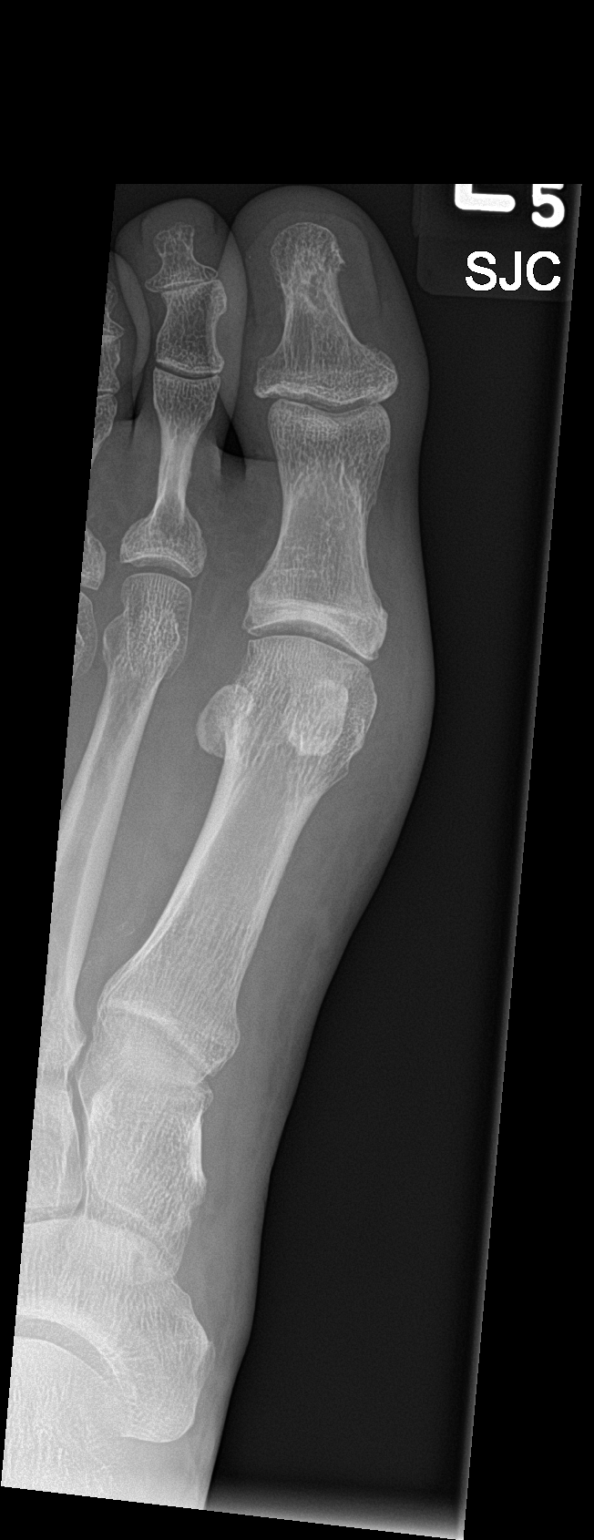

[toe obl]
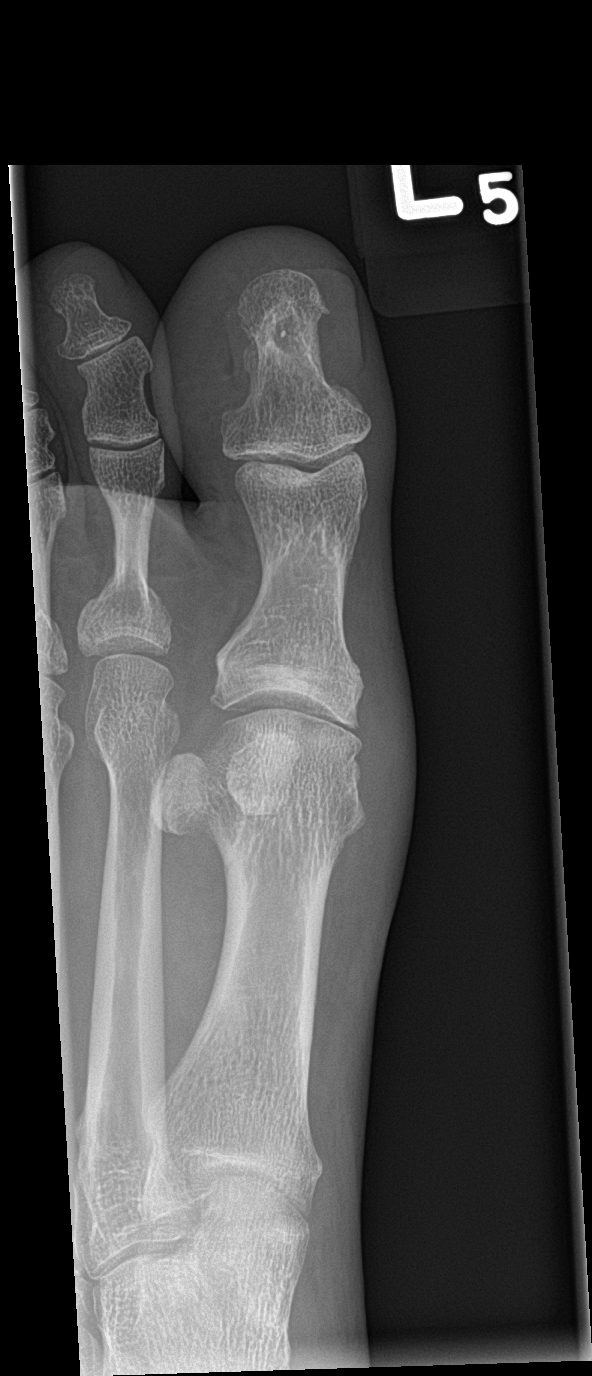

[toe lat]
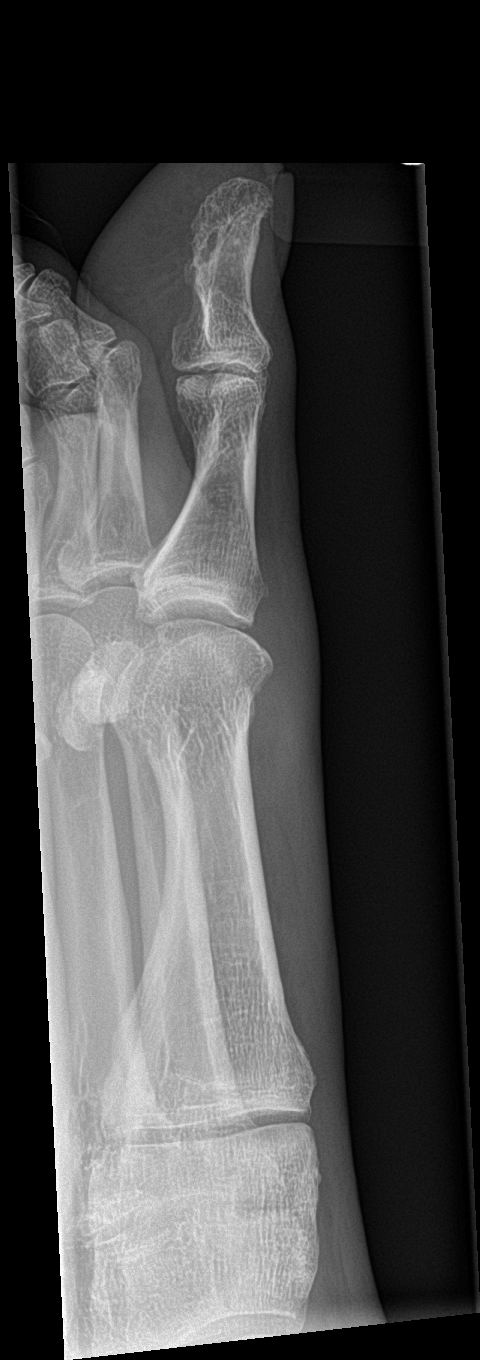

[3 of 3 positions shown; findings below may reference images not displayed]

FINDINGS: There is no evidence of fracture or dislocation. There is mild
osteoarthritis of the first MTP joint. Soft tissues are
unremarkable.
IMPRESSION: No acute osseous injury of the left great toe.

## 2017-04-20 DIAGNOSIS — F17210 Nicotine dependence, cigarettes, uncomplicated: Secondary | ICD-10-CM | POA: Insufficient documentation

## 2017-04-20 DIAGNOSIS — I739 Peripheral vascular disease, unspecified: Secondary | ICD-10-CM | POA: Insufficient documentation

## 2017-04-20 DIAGNOSIS — E876 Hypokalemia: Secondary | ICD-10-CM | POA: Insufficient documentation

## 2017-04-20 DIAGNOSIS — K861 Other chronic pancreatitis: Secondary | ICD-10-CM | POA: Insufficient documentation

## 2017-04-21 ENCOUNTER — Ambulatory Visit: Payer: 59 | Admitting: Nurse Practitioner

## 2017-04-21 ENCOUNTER — Encounter: Payer: Self-pay | Admitting: Nurse Practitioner

## 2017-04-21 VITALS — BP 175/98 | HR 66 | Resp 16 | Ht 67.0 in | Wt 151.0 lb

## 2017-04-21 DIAGNOSIS — M159 Polyosteoarthritis, unspecified: Secondary | ICD-10-CM

## 2017-04-21 DIAGNOSIS — Z23 Encounter for immunization: Secondary | ICD-10-CM

## 2017-04-21 DIAGNOSIS — M150 Primary generalized (osteo)arthritis: Secondary | ICD-10-CM

## 2017-04-21 DIAGNOSIS — R300 Dysuria: Secondary | ICD-10-CM | POA: Diagnosis not present

## 2017-04-21 DIAGNOSIS — F17210 Nicotine dependence, cigarettes, uncomplicated: Secondary | ICD-10-CM

## 2017-04-21 DIAGNOSIS — K861 Other chronic pancreatitis: Secondary | ICD-10-CM

## 2017-04-21 DIAGNOSIS — I10 Essential (primary) hypertension: Secondary | ICD-10-CM

## 2017-04-21 DIAGNOSIS — Z0001 Encounter for general adult medical examination with abnormal findings: Secondary | ICD-10-CM

## 2017-04-21 MED ORDER — OXYCODONE-ACETAMINOPHEN 5-325 MG PO TABS: 1.0000 | ORAL_TABLET | Freq: Three times a day (TID) | ORAL | 0 refills | Status: DC | PRN

## 2017-04-21 MED ORDER — NAPROXEN SODIUM ER 500 MG PO TB24: 500.0000 mg | ORAL_TABLET | Freq: Two times a day (BID) | ORAL | 3 refills | Status: DC

## 2017-04-21 NOTE — Addendum Note (Signed)
Addended by: Corlis Hove on: 04/21/2017 11:57 AM   Modules accepted: Orders

## 2017-04-21 NOTE — Addendum Note (Signed)
Addended by: Leretha Pol on: 04/21/2017 11:45 AM   Modules accepted: Orders

## 2017-04-21 NOTE — Progress Notes (Signed)
[facility] [place], [place] [address]  Internal MEDICINE  Office Visit Note  Patient Name: Donald Scott  [id number]  [id number]  Date of Service: 04/21/2017  Chief Complaint  Patient presents with  . Osteoarthritis    right foot. gout flare especially when traveling.     Other  This is a recurrent problem. The current episode started more than 1 month ago. The problem occurs intermittently. The problem has been waxing and waning. Associated symptoms include arthralgias and joint swelling. Pertinent negatives include no abdominal pain, chest pain, chills, congestion, coughing, fatigue, headaches, nausea, neck pain, numbness, rash, sore throat or vomiting. Exacerbated by: sitting for long period of time.  He has tried NSAIDs for the symptoms. The treatment provided mild relief.    Pt is here for health maintenance exam.    Current Medication: Outpatient Encounter Medications as of 04/21/2017  Medication Sig Note  . albuterol (PROAIR HFA) 108 (90 Base) MCG/ACT inhaler Inhale 1 puff into the lungs every 6 (six) hours as needed for wheezing or shortness of breath.   . bisoprolol-hydrochlorothiazide (ZIAC) 5-6.25 MG tablet TK 1 T PO QD 05/22/2015: Received from: External Pharmacy  . fluticasone (FLONASE) 50 MCG/ACT nasal spray SHAKE LQ AND U 1 TO 2 SPRAYS IEN D FOR RHINITIS 05/22/2015: Received from: External Pharmacy  . lipase/protease/amylase (CREON) 36000 UNITS CPEP capsule  05/22/2015: Received from: [facility]  . losartan (COZAAR) 25 MG tablet TK 1 T PO QHS FOR BP 05/22/2015: Received from: External Pharmacy  . naproxen (NAPRELAN) 500 MG 24 hr tablet Take 500 mg by mouth 2 (two) times daily.   Marland Kitchen NIFEdipine (AFEDITAB CR) 30 MG 24 hr tablet Take by mouth. 05/22/2015: Received from: [place]  . NIFEdipine (PROCARDIA-XL/ADALAT-CC/NIFEDICAL-XL) 30 MG 24 hr tablet  05/22/2015: Received from: [facility]  .  omeprazole (PRILOSEC) 40 MG capsule  05/22/2015: Received from: [facility]  . potassium chloride (KLOR-CON 10) 10 MEQ tablet Take 10 mEq by mouth daily.   Marland Kitchen amLODipine (NORVASC) 5 MG tablet  05/22/2015: Received from: [facility]  . aspirin 81 MG tablet Take by mouth. 05/22/2015: Received from: [place]  . carvedilol (COREG) 6.25 MG tablet Take by mouth. 05/22/2015: Received from: [place]  . enalapril (VASOTEC) 20 MG tablet Take by mouth. 05/22/2015: Received from: [place]  . etodolac (LODINE) 300 MG capsule Take 1 capsule (300 mg total) by mouth 3 (three) times daily with meals as needed for moderate pain. (Patient not taking: Reported on 04/21/2017)   . gabapentin (NEURONTIN) 300 MG capsule Take by mouth. 05/22/2015: Received from: [place]  . HYDROcodone-acetaminophen (VICODIN) 5-500 MG tablet Reported on 05/22/2015 05/22/2015: Received from: [facility]  . insulin NPH-regular Human (HUMULIN 70/30) (70-30) 100 UNIT/ML injection  05/22/2015: Received from: [facility]  . metFORMIN (GLUCOPHAGE) 500 MG tablet Take by mouth. 05/22/2015: Received from: [place]  . nabumetone (RELAFEN) 500 MG tablet  05/22/2015: Received from: [facility]  . nortriptyline (PAMELOR) 25 MG capsule  05/22/2015: Received from: [facility]  . oxyCODONE-acetaminophen (PERCOCET/ROXICET) 5-325 MG tablet Reported on 05/22/2015 05/22/2015: Received from: [place]   No facility-administered encounter medications on file as of 04/21/2017.     Surgical History: Past Surgical History:  Procedure Laterality Date  . CAROTID STENT  2010  . Vascular  bypass graft      Medical History: Past Medical History:  Diagnosis Date  . Diabetes mellitus ([place]) 05/22/2015  . Gout 05/22/2015  . Hypertension     Family  History: Family History  Problem Relation Age of Onset  . Arthritis Mother     Social History   Socioeconomic History  . Marital status: Married    Spouse name: Not on file  . Number of children: Not on file  . Years of education: Not on file  . Highest education level: Not on file  Social Needs  . Financial resource strain: Not on file  . Food insecurity - worry: Not on file  . Food insecurity - inability: Not on file  . Transportation needs - medical: Not on file  . Transportation needs - non-medical: Not on file  Occupational History  . Not on file  Tobacco Use  . Smoking status: Current Every Day Smoker  . Smokeless tobacco: Never Used  Substance and Sexual Activity  . Alcohol use: Yes    Alcohol/week: 0.0 oz  . Drug use: No  . Sexual activity: Not on file  Other Topics Concern  . Not on file  Social History Narrative  . Not on file      Review of Systems  Constitutional: Negative for chills, fatigue and unexpected weight change.  HENT: Negative for congestion, postnasal drip, rhinorrhea, sneezing and sore throat.   Eyes: Negative for discharge, redness and itching.  Respiratory: Negative for cough, chest tightness and shortness of breath.   Cardiovascular: Negative for chest pain and palpitations.       Elevated blood pressure.  Gastrointestinal: Positive for abdominal distention and diarrhea. Negative for abdominal pain, constipation, nausea and vomiting.       Continues to see GI for chronic pancreatitis.   Endocrine: Negative for cold intolerance, heat intolerance, polydipsia, polyphagia and polyuria.  Genitourinary: Negative for dysuria and frequency.  Musculoskeletal: Positive for arthralgias and joint swelling. Negative for back pain and neck pain.  Skin: Negative for rash.  Allergic/Immunologic: Positive for environmental allergies.  Neurological: Negative for tremors, numbness and headaches.  Psychiatric/Behavioral: Negative for behavioral problems  (Depression), sleep disturbance and suicidal ideas. The patient is not nervous/anxious.     Today's Vitals   04/21/17 1052  BP: (!) 175/98  Pulse: 66  Resp: 16  SpO2: 98%  Weight: 151 lb (68.5 kg)  Height: 5\' 7"  (1.702 m)    Physical Exam  Constitutional: He is oriented to person, place, and time. He appears well-developed and well-nourished. No distress.  HENT:  Head: Normocephalic and atraumatic.  Mouth/Throat: Oropharynx is clear and moist. No oropharyngeal exudate.  Eyes: Conjunctivae and EOM are normal. Pupils are equal, round, and reactive to light.  Neck: Normal range of motion. Neck supple. No JVD present. Carotid bruit is not present. No tracheal deviation present. No thyromegaly present.  Cardiovascular: Normal rate, regular rhythm and normal heart sounds. Exam reveals no gallop and no friction rub.  No murmur heard. Pulmonary/Chest: Effort normal and breath sounds normal. No respiratory distress. He has no wheezes. He has no rales. He exhibits no tenderness.  Abdominal: Soft. Bowel sounds are normal. There is tenderness.  Musculoskeletal: Normal range of motion.  Lymphadenopathy:    He has no cervical adenopathy.  Neurological: He is alert and oriented to person, place, and time. No cranial nerve deficit.  Skin: Skin is warm and dry. He is not diaphoretic.  Psychiatric: He has a normal mood and affect. His  behavior is normal. Judgment and thought content normal.   Assessment/Plan:  1. Encounter for general adult medical examination with abnormal findings Annual wellness visit today.   2. Primary osteoarthritis involving multiple joints - naproxen (NAPRELAN) 500 MG 24 hr tablet; Take 1 tablet (500 mg total) by mouth 2 (two) times daily.  Dispense: 60 tablet; Refill: 3 - oxyCODONE-acetaminophen (PERCOCET/ROXICET) 5-325 MG tablet; Take 1 tablet by mouth every 8 (eight) hours as needed for severe pain. Reported on 05/22/2015  Dispense: 15 tablet; Refill: 0  3. Flu  vaccine need - Flu Vaccine MDCK QUAD PF  4. Essential hypertension Generally stable. Continue to take bp medication as prescribed. Limit intake of salt in the diet increase water intake.   5. Other chronic pancreatitis ([place]) Encouraged patient to schedule follow up with GI to discuss medication changes.   6. Dysuria - Urinalysis, Routine w reflex microscopic  7. Nicotine dependence, cigarettes, uncomplicated Smoking cessation discussed.    He should return for follow up in 2 months and sooner if needed.   General Counseling: Thinh verbalizes understanding of the findings of todays visit and agrees with plan of treatment. I have discussed any further diagnostic evaluation that may be needed or ordered today. We also reviewed his medications today. he has been encouraged to call the office with any questions or concerns that should arise related to todays visit.  Hypertension Counseling:   The following hypertensive lifestyle modification were recommended and discussed:  1. Limiting alcohol intake to less than 1 oz/day of ethanol:(24 oz of beer or 8 oz of wine or 2 oz of 100-proof whiskey). 2. Take baby ASA 81 mg daily. 3. Importance of regular aerobic exercise and losing weight. 4. Reduce dietary saturated fat and cholesterol intake for overall cardiovascular health. 5. Maintaining adequate dietary potassium, calcium, and magnesium intake. 6. Regular monitoring of the blood pressure. 7. Reduce sodium intake to less than 100 mmol/day (less than 2.3 gm of sodium or less than 6 gm of sodium choride)   Reviewed risks and possible side effects associated with taking opiates and benzodiazepines. Combination of these could cause dizziness and drowsiness. Advised him not to drive or operate machinery when taking these medications, as he could put his life and the lives of others at risk. He voiced understanding.   This patient was seen by Leretha Pol, FNP- C in Collaboration with Dr Lavera Guise as a part of collaborative care agreement    Orders Placed This Encounter  Procedures  . Urinalysis, Routine w reflex microscopic    Time spent: 58 Minutes          Dr Lavera Guise Internal medicine

## 2017-04-22 LAB — URINALYSIS, ROUTINE W REFLEX MICROSCOPIC
Bilirubin, UA: NEGATIVE
Glucose, UA: NEGATIVE
Ketones, UA: NEGATIVE
LEUKOCYTES UA: NEGATIVE
Nitrite, UA: NEGATIVE
PH UA: 5 (ref 5.0–7.5)
Protein, UA: NEGATIVE
RBC, UA: NEGATIVE
Specific Gravity, UA: <=1.005 — AB (ref 1.005–1.030)
Urobilinogen, Ur: 0.2 mg/dL (ref 0.2–1.0)

## 2017-06-20 ENCOUNTER — Ambulatory Visit (INDEPENDENT_AMBULATORY_CARE_PROVIDER_SITE_OTHER): Payer: 59 | Admitting: Nurse Practitioner

## 2017-06-20 ENCOUNTER — Encounter: Payer: Self-pay | Admitting: Nurse Practitioner

## 2017-06-20 VITALS — BP 200/110 | Resp 16 | Ht 67.0 in | Wt 151.0 lb

## 2017-06-20 DIAGNOSIS — I10 Essential (primary) hypertension: Secondary | ICD-10-CM

## 2017-06-20 DIAGNOSIS — K219 Gastro-esophageal reflux disease without esophagitis: Secondary | ICD-10-CM | POA: Diagnosis not present

## 2017-06-20 DIAGNOSIS — M150 Primary generalized (osteo)arthritis: Secondary | ICD-10-CM | POA: Diagnosis not present

## 2017-06-20 DIAGNOSIS — M159 Polyosteoarthritis, unspecified: Secondary | ICD-10-CM

## 2017-06-20 MED ORDER — OMEPRAZOLE 40 MG PO CPDR: 40.0000 mg | DELAYED_RELEASE_CAPSULE | Freq: Every day | ORAL | 3 refills | Status: DC

## 2017-06-20 MED ORDER — TELMISARTAN 40 MG PO TABS: 40.0000 mg | ORAL_TABLET | Freq: Every day | ORAL | 3 refills | Status: DC

## 2017-06-20 MED ORDER — SULFAMETHOXAZOLE-TRIMETHOPRIM 800-160 MG PO TABS: 1.0000 | ORAL_TABLET | Freq: Two times a day (BID) | ORAL | 0 refills | Status: DC

## 2017-06-20 MED ORDER — NAPROXEN SODIUM ER 500 MG PO TB24
500.0000 mg | ORAL_TABLET | Freq: Two times a day (BID) | ORAL | 3 refills | Status: DC
Start: 2017-06-20 — End: 2017-12-19

## 2017-06-20 MED ORDER — AMLODIPINE BESYLATE 5 MG PO TABS: 5.0000 mg | ORAL_TABLET | Freq: Every day | ORAL | 3 refills | Status: DC

## 2017-06-20 NOTE — Progress Notes (Signed)
[facility] [place], [place] [address]  Internal MEDICINE  Office Visit Note  Patient Name: Donald Scott  [id number]  [id number]  Date of Service: 06/21/2017  Chief Complaint  Patient presents with  . Hypertension    The patient is here for routine follow up. Blood pressure very elevated today. Has not been taking losartan 50mg  daily. Since this was recalled, he was tol by pharmacy to stop taking until he saw his provider. Continues to take amlodipine and bisoprolol/HCTZ daily,. The patient is c/o congestion, wheezing,and cough over the past several days. Using flonase and albuterol inhaler as needed, but congestion is persistent.     Pt is here for routine follow up.    Current Medication: Outpatient Encounter Medications as of 06/20/2017  Medication Sig Note  . albuterol (PROAIR HFA) 108 (90 Base) MCG/ACT inhaler Inhale 1 puff into the lungs every 6 (six) hours as needed for wheezing or shortness of breath.   Marland Kitchen amLODipine (NORVASC) 5 MG tablet Take 1 tablet (5 mg total) by mouth daily.   Marland Kitchen aspirin 81 MG tablet Take by mouth. 05/22/2015: Received from: [place]  . bisoprolol-hydrochlorothiazide (ZIAC) 5-6.25 MG tablet TK 1 T PO QD 05/22/2015: Received from: External Pharmacy  . etodolac (LODINE) 300 MG capsule Take 1 capsule (300 mg total) by mouth 3 (three) times daily with meals as needed for moderate pain. (Patient not taking: Reported on 04/21/2017)   . fluticasone (FLONASE) 50 MCG/ACT nasal spray SHAKE LQ AND U 1 TO 2 SPRAYS IEN D FOR RHINITIS 05/22/2015: Received from: External Pharmacy  . lipase/protease/amylase (CREON) 36000 UNITS CPEP capsule  05/22/2015: Received from: [facility]  . naproxen (NAPRELAN) 500 MG 24 hr tablet Take 1 tablet (500 mg total) by mouth 2 (two) times daily.   Marland Kitchen NIFEdipine (AFEDITAB CR) 30 MG 24 hr tablet Take by mouth. 05/22/2015: Received from: [place]  .  NIFEdipine (PROCARDIA-XL/ADALAT-CC/NIFEDICAL-XL) 30 MG 24 hr tablet  05/22/2015: Received from: [facility]  . omeprazole (PRILOSEC) 40 MG capsule Take 1 capsule (40 mg total) by mouth daily.   Marland Kitchen oxyCODONE-acetaminophen (PERCOCET/ROXICET) 5-325 MG tablet Take 1 tablet by mouth every 8 (eight) hours as needed for severe pain. Reported on 05/22/2015   . potassium chloride (KLOR-CON 10) 10 MEQ tablet Take 10 mEq by mouth daily.   Marland Kitchen sulfamethoxazole-trimethoprim (BACTRIM DS,SEPTRA DS) 800-160 MG tablet Take 1 tablet by mouth 2 (two) times daily.   Marland Kitchen telmisartan (MICARDIS) 40 MG tablet Take 1 tablet (40 mg total) by mouth daily.   . [DISCONTINUED] amLODipine (NORVASC) 5 MG tablet  05/22/2015: Received from: [facility]  . [DISCONTINUED] losartan (COZAAR) 25 MG tablet TK 1 T PO QHS FOR BP 05/22/2015: Received from: External Pharmacy  . [DISCONTINUED] naproxen (NAPRELAN) 500 MG 24 hr tablet Take 1 tablet (500 mg total) by mouth 2 (two) times daily.   . [DISCONTINUED] omeprazole (PRILOSEC) 40 MG capsule  05/22/2015: Received from: [place]   No facility-administered encounter medications on file as of 06/20/2017.     Surgical History: Past Surgical History:  Procedure Laterality Date  . CAROTID STENT  2010  . Vascular bypass graft      Medical History: Past Medical History:  Diagnosis Date  . Diabetes mellitus ([place]) 05/22/2015  . Gout 05/22/2015  . Hypertension     Family History: Family History  Problem Relation Age of Onset  . Arthritis Mother  Social History   Socioeconomic History  . Marital status: Married    Spouse name: Not on file  . Number of children: Not on file  . Years of education: Not on file  . Highest education level: Not on file  Social Needs  . Financial resource strain: Not on file  . Food insecurity - worry: Not on file  . Food insecurity - inability: Not on file  . Transportation needs - medical: Not on  file  . Transportation needs - non-medical: Not on file  Occupational History  . Not on file  Tobacco Use  . Smoking status: Current Every Day Smoker  . Smokeless tobacco: Never Used  Substance and Sexual Activity  . Alcohol use: Yes    Alcohol/week: 0.0 oz  . Drug use: No  . Sexual activity: Not on file  Other Topics Concern  . Not on file  Social History Narrative  . Not on file      Review of Systems  Constitutional: Positive for fatigue. Negative for chills and unexpected weight change.  HENT: Positive for congestion, sinus pressure, sinus pain and sore throat. Negative for postnasal drip, rhinorrhea and sneezing.   Eyes: Negative.  Negative for discharge, redness and itching.  Respiratory: Positive for cough and wheezing. Negative for chest tightness and shortness of breath.   Cardiovascular: Negative for chest pain and palpitations.       Elevated blood pressure. Has not been taking losartan since national recall. After looking at med list together, also not taking norvasc 5mg  daily.  Gastrointestinal: Positive for abdominal distention and diarrhea. Negative for abdominal pain, constipation, nausea and vomiting.       Continues to see GI for chronic pancreatitis.   Endocrine: Negative for cold intolerance, heat intolerance, polydipsia, polyphagia and polyuria.  Genitourinary: Negative for dysuria and frequency.  Musculoskeletal: Positive for arthralgias and joint swelling. Negative for back pain and neck pain.  Skin: Negative for rash.  Allergic/Immunologic: Positive for environmental allergies.  Neurological: Negative for tremors, numbness and headaches.  Psychiatric/Behavioral: Negative for behavioral problems (Depression), sleep disturbance and suicidal ideas. The patient is not nervous/anxious.     Today's Vitals   06/20/17 1045  BP: (!) 200/110  Resp: 16  SpO2: 98%  Weight: 151 lb (68.5 kg)  Height: 5\' 7"  (1.702 m)  PF: 72 L/min    Physical Exam   Constitutional: He is oriented to person, place, and time. He appears well-developed and well-nourished. No distress.  HENT:  Head: Normocephalic and atraumatic.  Nose: Rhinorrhea present. Right sinus exhibits frontal sinus tenderness. Left sinus exhibits frontal sinus tenderness.  Mouth/Throat: Oropharynx is clear and moist. No oropharyngeal exudate.  Eyes: Conjunctivae and EOM are normal. Pupils are equal, round, and reactive to light.  Neck: Normal range of motion. Neck supple. No JVD present. Carotid bruit is not present. No tracheal deviation present. No thyromegaly present.  Cardiovascular: Normal rate, regular rhythm and normal heart sounds. Exam reveals no gallop and no friction rub.  No murmur heard. Pulmonary/Chest: Effort normal and breath sounds normal. No respiratory distress. He has no wheezes. He has no rales. He exhibits no tenderness.  Congested, non-productive cough noted.   Abdominal: Soft. Bowel sounds are normal. There is no tenderness.  Musculoskeletal: Normal range of motion.  Lymphadenopathy:    He has cervical adenopathy.  Neurological: He is alert and oriented to person, place, and time. No cranial nerve deficit.  Skin: Skin is warm and dry. He is not diaphoretic.  Psychiatric:  He has a normal mood and affect. His behavior is normal. Judgment and thought content normal.  Nursing note and vitals reviewed.  Assessment/Plan:  1. Essential hypertension Change losartan to Micardis 40mg  daily. Restart amlodipine 5mg  daily. Continue bisoprolol/HCTZ 5/3.25mg  daily. Avoid excess salt in the diet and increase intake of water. Reassess in 3 to 4 weeks. - amLODipine (NORVASC) 5 MG tablet; Take 1 tablet (5 mg total) by mouth daily.  Dispense: 30 tablet; Refill: 3 - telmisartan (MICARDIS) 40 MG tablet; Take 1 tablet (40 mg total) by mouth daily.  Dispense: 30 tablet; Refill: 3  2. Primary osteoarthritis involving multiple joints Take naproxen 500mg  bid prn pain/inflammation.   - naproxen (NAPRELAN) 500 MG 24 hr tablet; Take 1 tablet (500 mg total) by mouth 2 (two) times daily.  Dispense: 60 tablet; Refill: 3  3. Gastroesophageal reflux disease without esophagitis Continue omeprazole daily.  - omeprazole (PRILOSEC) 40 MG capsule; Take 1 capsule (40 mg total) by mouth daily.  Dispense: 30 capsule; Refill: 3  General Counseling: Drake verbalizes understanding of the findings of todays visit and agrees with plan of treatment. I have discussed any further diagnostic evaluation that may be needed or ordered today. We also reviewed his medications today. he has been encouraged to call the office with any questions or concerns that should arise related to todays visit.   Hypertension Counseling:   The following hypertensive lifestyle modification were recommended and discussed:  1. Limiting alcohol intake to less than 1 oz/day of ethanol:(24 oz of beer or 8 oz of wine or 2 oz of 100-proof whiskey). 2. Take baby ASA 81 mg daily. 3. Importance of regular aerobic exercise and losing weight. 4. Reduce dietary saturated fat and cholesterol intake for overall cardiovascular health. 5. Maintaining adequate dietary potassium, calcium, and magnesium intake. 6. Regular monitoring of the blood pressure. 7. Reduce sodium intake to less than 100 mmol/day (less than 2.3 gm of sodium or less than 6 gm of sodium choride)   This patient was seen by Leretha Pol, FNP- C in Collaboration with Dr Lavera Guise as a part of collaborative care agreement    Meds ordered this encounter  Medications  . amLODipine (NORVASC) 5 MG tablet    Sig: Take 1 tablet (5 mg total) by mouth daily.    Dispense:  30 tablet    Refill:  3    Order Specific Question:   Supervising Provider    Answer:   Lavera Guise [[id number]]  . telmisartan (MICARDIS) 40 MG tablet    Sig: Take 1 tablet (40 mg total) by mouth daily.    Dispense:  30 tablet    Refill:  3    D/c losartan due to national recall. Add  telmisartan 40mg  daily.    Order Specific Question:   Supervising Provider    Answer:   Lavera Guise [[id number]]  . sulfamethoxazole-trimethoprim (BACTRIM DS,SEPTRA DS) 800-160 MG tablet    Sig: Take 1 tablet by mouth 2 (two) times daily.    Dispense:  20 tablet    Refill:  0    Order Specific Question:   Supervising Provider    Answer:   Lavera Guise [[id number]]  . naproxen (NAPRELAN) 500 MG 24 hr tablet    Sig: Take 1 tablet (500 mg total) by mouth 2 (two) times daily.    Dispense:  60 tablet    Refill:  3    Order Specific Question:   Supervising Provider  Answer:   Lavera Guise [place]  . omeprazole (PRILOSEC) 40 MG capsule    Sig: Take 1 capsule (40 mg total) by mouth daily.    Dispense:  30 capsule    Refill:  3    Order Specific Question:   Supervising Provider    Answer:   Lavera Guise [[id number]]    Time spent: 60 Minutes     Dr Lavera Guise Internal medicine

## 2017-07-19 ENCOUNTER — Ambulatory Visit: Payer: Self-pay | Admitting: Nurse Practitioner

## 2017-11-21 ENCOUNTER — Other Ambulatory Visit: Payer: Self-pay

## 2017-11-21 DIAGNOSIS — I10 Essential (primary) hypertension: Secondary | ICD-10-CM

## 2017-11-21 MED ORDER — TELMISARTAN 40 MG PO TABS: 40.0000 mg | ORAL_TABLET | Freq: Every day | ORAL | 3 refills | Status: DC

## 2017-11-21 MED ORDER — AMLODIPINE BESYLATE 5 MG PO TABS: 5.0000 mg | ORAL_TABLET | Freq: Every day | ORAL | 3 refills | Status: DC

## 2017-12-06 ENCOUNTER — Other Ambulatory Visit: Payer: Self-pay

## 2017-12-06 MED ORDER — BISOPROLOL-HYDROCHLOROTHIAZIDE 5-6.25 MG PO TABS: ORAL_TABLET | ORAL | 0 refills | Status: DC

## 2017-12-14 ENCOUNTER — Ambulatory Visit: Payer: Self-pay | Admitting: Adult Health

## 2017-12-19 ENCOUNTER — Encounter: Payer: Self-pay | Admitting: Nurse Practitioner

## 2017-12-19 ENCOUNTER — Ambulatory Visit (INDEPENDENT_AMBULATORY_CARE_PROVIDER_SITE_OTHER): Payer: 59 | Admitting: Nurse Practitioner

## 2017-12-19 VITALS — BP 130/84 | HR 82 | Temp 98.3°F | Resp 16 | Ht 67.0 in | Wt 158.2 lb

## 2017-12-19 DIAGNOSIS — K861 Other chronic pancreatitis: Secondary | ICD-10-CM

## 2017-12-19 DIAGNOSIS — J0141 Acute recurrent pansinusitis: Secondary | ICD-10-CM | POA: Insufficient documentation

## 2017-12-19 DIAGNOSIS — K219 Gastro-esophageal reflux disease without esophagitis: Secondary | ICD-10-CM | POA: Diagnosis not present

## 2017-12-19 DIAGNOSIS — Z0001 Encounter for general adult medical examination with abnormal findings: Secondary | ICD-10-CM | POA: Insufficient documentation

## 2017-12-19 DIAGNOSIS — I10 Essential (primary) hypertension: Secondary | ICD-10-CM

## 2017-12-19 DIAGNOSIS — K029 Dental caries, unspecified: Secondary | ICD-10-CM | POA: Diagnosis not present

## 2017-12-19 DIAGNOSIS — M150 Primary generalized (osteo)arthritis: Secondary | ICD-10-CM | POA: Diagnosis not present

## 2017-12-19 DIAGNOSIS — M159 Polyosteoarthritis, unspecified: Secondary | ICD-10-CM

## 2017-12-19 MED ORDER — NAPROXEN SODIUM ER 500 MG PO TB24: 500.0000 mg | ORAL_TABLET | Freq: Two times a day (BID) | ORAL | 1 refills | Status: DC

## 2017-12-19 MED ORDER — SULFAMETHOXAZOLE-TRIMETHOPRIM 800-160 MG PO TABS: 1.0000 | ORAL_TABLET | Freq: Two times a day (BID) | ORAL | 0 refills | Status: DC

## 2017-12-19 NOTE — Progress Notes (Signed)
[facility] [place], [place] [address]  Internal MEDICINE  Office Visit Note  Patient Name: Donald Scott  [id number]  [id number]  Date of Service: 12/19/2017   Pt is here for routine health maintenance examination  Chief Complaint  Patient presents with  . Sinusitis  . Annual Exam  . Hypertension     Sinusitis  This is a new problem. The current episode started in the past 7 days. The problem is unchanged. There has been no fever. The fever has been present for less than 1 day. His pain is at a severity of 0/10. Associated symptoms include congestion, sinus pressure, sneezing and a sore throat. Pertinent negatives include no chills, coughing, headaches, neck pain or shortness of breath. Past treatments include spray decongestants.  Hypertension  This is a chronic problem. The current episode started more than 1 year ago. The problem has been gradually improving since onset. The problem is resistant. Pertinent negatives include no chest pain, headaches, neck pain, palpitations, peripheral edema or shortness of breath. Agents associated with hypertension include NSAIDs. Past treatments include angiotensin blockers, beta blockers and calcium channel blockers. The current treatment provides moderate improvement. Compliance problems include diet.      Current Medication: Outpatient Encounter Medications as of 12/19/2017  Medication Sig Note  . albuterol (PROAIR HFA) 108 (90 Base) MCG/ACT inhaler Inhale 1 puff into the lungs every 6 (six) hours as needed for wheezing or shortness of breath.   Marland Kitchen amLODipine (NORVASC) 5 MG tablet Take 1 tablet (5 mg total) by mouth daily.   . bisoprolol-hydrochlorothiazide (ZIAC) 5-6.25 MG tablet TK 1 T PO QD   . fluticasone (FLONASE) 50 MCG/ACT nasal spray SHAKE LQ AND U 1 TO 2 SPRAYS IEN D FOR RHINITIS 05/22/2015: Received from: External Pharmacy  . naproxen (NAPRELAN) 500 MG 24 hr tablet Take 1 tablet (500 mg total) by mouth 2  (two) times daily.   Marland Kitchen omeprazole (PRILOSEC) 40 MG capsule Take 1 capsule (40 mg total) by mouth daily.   . potassium chloride (KLOR-CON 10) 10 MEQ tablet Take 10 mEq by mouth daily.   Marland Kitchen telmisartan (MICARDIS) 40 MG tablet Take 1 tablet (40 mg total) by mouth daily.   . [DISCONTINUED] naproxen (NAPRELAN) 500 MG 24 hr tablet Take 1 tablet (500 mg total) by mouth 2 (two) times daily.   Marland Kitchen aspirin 81 MG tablet Take by mouth. 05/22/2015: Received from: [place]  . etodolac (LODINE) 300 MG capsule Take 1 capsule (300 mg total) by mouth 3 (three) times daily with meals as needed for moderate pain. (Patient not taking: Reported on 04/21/2017)   . lipase/protease/amylase (CREON) 36000 UNITS CPEP capsule  05/22/2015: Received from: [facility]  . NIFEdipine (AFEDITAB CR) 30 MG 24 hr tablet Take by mouth. 05/22/2015: Received from: [place]  . NIFEdipine (PROCARDIA-XL/ADALAT-CC/NIFEDICAL-XL) 30 MG 24 hr tablet  05/22/2015: Received from: [facility]  . oxyCODONE-acetaminophen (PERCOCET/ROXICET) 5-325 MG tablet Take 1 tablet by mouth every 8 (eight) hours as needed for severe pain. Reported on 05/22/2015   . sulfamethoxazole-trimethoprim (BACTRIM DS,SEPTRA DS) 800-160 MG tablet Take 1 tablet by mouth 2 (two) times daily.   . [DISCONTINUED] sulfamethoxazole-trimethoprim (BACTRIM DS,SEPTRA DS) 800-160 MG tablet Take 1 tablet by mouth 2 (two) times daily. (Patient not taking: Reported on 12/19/2017)    No facility-administered encounter medications on file as of 12/19/2017.     Surgical History: Past Surgical History:  Procedure Laterality Date  .  CAROTID STENT  2010  . Vascular bypass graft      Medical History: Past Medical History:  Diagnosis Date  . Diabetes mellitus ([place]) 05/22/2015  . Gout 05/22/2015  . Hypertension     Family History: Family History  Problem Relation Age of Onset  . Arthritis Mother       Review of  Systems  Constitutional: Negative for activity change, chills, fatigue and unexpected weight change.  HENT: Positive for congestion, sinus pressure, sinus pain, sneezing and sore throat. Negative for postnasal drip and rhinorrhea.   Eyes: Negative.  Negative for discharge, redness and itching.  Respiratory: Negative for cough, chest tightness, shortness of breath and wheezing.   Cardiovascular: Negative for chest pain and palpitations.       Blood pressure improved today.  Gastrointestinal: Negative for abdominal distention, abdominal pain, constipation, diarrhea, nausea and vomiting.       Frequent loose bowel movements, not diarrhea. Has not noted a difference with increased dose of creon.   Endocrine: Negative for cold intolerance, heat intolerance, polydipsia, polyphagia and polyuria.  Genitourinary: Negative for dysuria and frequency.  Musculoskeletal: Positive for arthralgias, back pain and joint swelling. Negative for neck pain.  Skin: Negative for rash.  Allergic/Immunologic: Positive for environmental allergies.  Neurological: Negative for dizziness, tremors, numbness and headaches.  Hematological: Negative for adenopathy.  Psychiatric/Behavioral: Negative for behavioral problems (Depression), decreased concentration, dysphoric mood, sleep disturbance and suicidal ideas. The patient is not nervous/anxious.      Today's Vitals   12/19/17 0833  BP: 130/84  Pulse: 82  Resp: 16  Temp: 98.3 F (36.8 C)  SpO2: 98%  Weight: 158 lb 3.2 oz (71.8 kg)  Height: 5\' 7"  (1.702 m)    Physical Exam  Constitutional: He is oriented to person, place, and time. He appears well-developed and well-nourished. No distress.  HENT:  Head: Normocephalic and atraumatic.  Nose: Rhinorrhea present. Right sinus exhibits no maxillary sinus tenderness and no frontal sinus tenderness. Left sinus exhibits no maxillary sinus tenderness and no frontal sinus tenderness.  Mouth/Throat: Oropharynx is clear and  moist. No oropharyngeal exudate.  Eyes: Pupils are equal, round, and reactive to light. Conjunctivae and EOM are normal.  Multiple dental caries present.   Neck: Normal range of motion. Neck supple. No JVD present. Carotid bruit is not present. No tracheal deviation present. No thyromegaly present.  Cardiovascular: Normal rate, regular rhythm, normal heart sounds and intact distal pulses. Exam reveals no gallop and no friction rub.  No murmur heard. Pulmonary/Chest: Effort normal and breath sounds normal. No respiratory distress. He has no wheezes. He has no rales. He exhibits no tenderness.  Congested, non-productive cough noted.   Abdominal: Soft. Bowel sounds are normal. There is no tenderness.  Musculoskeletal: Normal range of motion.  Arthritic changes present in multiple joints, especially the joints of hands and fingers.  Not point tenderness currently present.   Lymphadenopathy:    He has no cervical adenopathy.  Neurological: He is alert and oriented to person, place, and time. No cranial nerve deficit.  Skin: Skin is warm and dry. Capillary refill takes less than 2 seconds. He is not diaphoretic.  Psychiatric: He has a normal mood and affect. His behavior is normal. Judgment and thought content normal.  Nursing note and vitals reviewed.  MMSE - Mini Mental State Exam 12/19/2017  Orientation to time 5  Orientation to Place 5  Registration 3  Attention/ Calculation 5  Recall 3  Language- name 2 objects 2  Language- repeat 1  Language- follow 3 step command 3  Language- read & follow direction 1  Write a sentence 1  Copy design 1  Copy design-comments 1  Total score 30    Functional Status Survey: Is the patient deaf or have difficulty hearing?: No Does the patient have difficulty seeing, even when wearing glasses/contacts?: No Does the patient have difficulty concentrating, remembering, or making decisions?: No Does the patient have difficulty walking or climbing stairs?:  No Does the patient have difficulty dressing or bathing?: No Does the patient have difficulty doing errands alone such as visiting a doctor's office or shopping?: No   Depression screen [facility] 2/9 12/19/2017 04/21/2017  Decreased Interest 0 0  Down, Depressed, Hopeless 0 0  PHQ - 2 Score 0 0    Fall Risk  12/19/2017 04/21/2017  Falls in the past year? No No    Assessment/Plan:  1. Encounter for general adult medical examination with abnormal findings Annual health maintenance exam today.  - UA/M w/rflx Culture, Routine  2. Acute recurrent pansinusitis Will do round bactrim DS bid for 21 days due to chronicity of sinusitis. Use nasal spray and OTC medication to alleviate symptoms.  - sulfamethoxazole-trimethoprim (BACTRIM DS,SEPTRA DS) 800-160 MG tablet; Take 1 tablet by mouth 2 (two) times daily.  Dispense: 42 tablet; Refill: 0  3. Dental caries Bactrim BID for 21 days to help with dental infection. Patient strongly advised to make appointment with dentist for further evaluation and treatment.   4. Essential hypertension Doing well. Continue bp medication as prescribed.   5. Primary osteoarthritis involving multiple joints Add back naproxen 500mg  twice daily as needed.  - naproxen (NAPRELAN) 500 MG 24 hr tablet; Take 1 tablet (500 mg total) by mouth 2 (two) times daily.  Dispense: 180 tablet; Refill: 1  6. Other chronic pancreatitis ([place]) Continue creon as prescribed and GI appointments as scheduled.   7. Gastroesophageal reflux disease without esophagitis Continue omeprazole as prescribed.   General Counseling: Trevin verbalizes understanding of the findings of todays visit and agrees with plan of treatment. I have discussed any further diagnostic evaluation that may be needed or ordered today. We also reviewed his medications today. he has been encouraged to call the office with any questions or concerns that should arise related to todays visit.    Counseling:  This patient  was seen by Leretha Pol FNP Collaboration with Dr Lavera Guise as a part of collaborative care agreement  Orders Placed This Encounter  Procedures  . UA/M w/rflx Culture, Routine    Meds ordered this encounter  Medications  . sulfamethoxazole-trimethoprim (BACTRIM DS,SEPTRA DS) 800-160 MG tablet    Sig: Take 1 tablet by mouth 2 (two) times daily.    Dispense:  42 tablet    Refill:  0    Order Specific Question:   Supervising Provider    Answer:   Lavera Guise [[id number]]  . naproxen (NAPRELAN) 500 MG 24 hr tablet    Sig: Take 1 tablet (500 mg total) by mouth 2 (two) times daily.    Dispense:  180 tablet    Refill:  1    This is for 90 day prescription    Order Specific Question:   Supervising Provider    Answer:   Lavera Guise [[id number]]    Time spent: [place], MD  Internal Medicine

## 2017-12-20 LAB — UA/M W/RFLX CULTURE, ROUTINE
BILIRUBIN UA: NEGATIVE
GLUCOSE, UA: NEGATIVE
Ketones, UA: NEGATIVE
LEUKOCYTES UA: NEGATIVE
Nitrite, UA: NEGATIVE
PH UA: 5.5 (ref 5.0–7.5)
PROTEIN UA: NEGATIVE
RBC, UA: NEGATIVE
Specific Gravity, UA: 1.009 (ref 1.005–1.030)
Urobilinogen, Ur: 0.2 mg/dL (ref 0.2–1.0)

## 2017-12-20 LAB — MICROSCOPIC EXAMINATION
BACTERIA UA: NONE SEEN
CASTS: NONE SEEN /LPF
Epithelial Cells (non renal): NONE SEEN /hpf (ref 0–10)
RBC, UA: NONE SEEN /hpf (ref 0–2)

## 2018-01-02 ENCOUNTER — Ambulatory Visit: Payer: Self-pay

## 2018-01-02 DIAGNOSIS — E119 Type 2 diabetes mellitus without complications: Secondary | ICD-10-CM | POA: Diagnosis not present

## 2018-01-16 ENCOUNTER — Ambulatory Visit (INDEPENDENT_AMBULATORY_CARE_PROVIDER_SITE_OTHER): Payer: Medicare HMO

## 2018-01-16 DIAGNOSIS — Z23 Encounter for immunization: Secondary | ICD-10-CM | POA: Diagnosis not present

## 2018-02-20 ENCOUNTER — Other Ambulatory Visit: Payer: Self-pay

## 2018-02-20 DIAGNOSIS — I10 Essential (primary) hypertension: Secondary | ICD-10-CM

## 2018-02-20 MED ORDER — AMLODIPINE BESYLATE 5 MG PO TABS: 5.0000 mg | ORAL_TABLET | Freq: Every day | ORAL | 3 refills | Status: DC

## 2018-02-20 MED ORDER — POTASSIUM CHLORIDE ER 10 MEQ PO TBCR: 10.0000 meq | EXTENDED_RELEASE_TABLET | Freq: Every day | ORAL | 5 refills | Status: DC

## 2018-02-20 MED ORDER — TELMISARTAN 40 MG PO TABS: 40.0000 mg | ORAL_TABLET | Freq: Every day | ORAL | 3 refills | Status: DC

## 2018-03-06 ENCOUNTER — Other Ambulatory Visit: Payer: Self-pay | Admitting: Nurse Practitioner

## 2018-03-06 MED ORDER — BISOPROLOL-HYDROCHLOROTHIAZIDE 5-6.25 MG PO TABS: ORAL_TABLET | ORAL | 1 refills | Status: DC

## 2018-03-17 ENCOUNTER — Other Ambulatory Visit: Payer: Self-pay

## 2018-03-17 DIAGNOSIS — K219 Gastro-esophageal reflux disease without esophagitis: Secondary | ICD-10-CM

## 2018-03-17 MED ORDER — OMEPRAZOLE 40 MG PO CPDR: 40.0000 mg | DELAYED_RELEASE_CAPSULE | Freq: Every day | ORAL | 3 refills | Status: DC

## 2018-03-27 ENCOUNTER — Other Ambulatory Visit: Payer: Self-pay

## 2018-03-27 DIAGNOSIS — I10 Essential (primary) hypertension: Secondary | ICD-10-CM

## 2018-03-27 MED ORDER — AMLODIPINE BESYLATE 5 MG PO TABS: 5.0000 mg | ORAL_TABLET | Freq: Every day | ORAL | 3 refills | Status: DC

## 2018-03-27 MED ORDER — TELMISARTAN 40 MG PO TABS: 40.0000 mg | ORAL_TABLET | Freq: Every day | ORAL | 3 refills | Status: DC

## 2018-04-28 ENCOUNTER — Other Ambulatory Visit: Payer: Self-pay | Admitting: Nurse Practitioner

## 2018-04-28 DIAGNOSIS — M159 Polyosteoarthritis, unspecified: Secondary | ICD-10-CM

## 2018-04-28 DIAGNOSIS — M150 Primary generalized (osteo)arthritis: Principal | ICD-10-CM

## 2018-04-28 MED ORDER — NAPROXEN SODIUM ER 500 MG PO TB24
500.0000 mg | ORAL_TABLET | Freq: Two times a day (BID) | ORAL | 1 refills | Status: DC
Start: 2018-04-28 — End: 2018-05-02

## 2018-05-02 ENCOUNTER — Other Ambulatory Visit: Payer: Self-pay

## 2018-05-02 DIAGNOSIS — M150 Primary generalized (osteo)arthritis: Principal | ICD-10-CM

## 2018-05-02 DIAGNOSIS — M159 Polyosteoarthritis, unspecified: Secondary | ICD-10-CM

## 2018-05-02 MED ORDER — NAPROXEN SODIUM ER 500 MG PO TB24: 500.0000 mg | ORAL_TABLET | Freq: Two times a day (BID) | ORAL | 1 refills | Status: DC

## 2018-06-19 ENCOUNTER — Encounter: Payer: Self-pay | Admitting: Nurse Practitioner

## 2018-06-19 ENCOUNTER — Ambulatory Visit (INDEPENDENT_AMBULATORY_CARE_PROVIDER_SITE_OTHER): Payer: Medicare HMO | Admitting: Nurse Practitioner

## 2018-06-19 ENCOUNTER — Other Ambulatory Visit: Payer: Self-pay

## 2018-06-19 VITALS — BP 168/80 | HR 63 | Resp 16 | Ht 67.0 in | Wt 156.8 lb

## 2018-06-19 DIAGNOSIS — E119 Type 2 diabetes mellitus without complications: Secondary | ICD-10-CM | POA: Diagnosis not present

## 2018-06-19 DIAGNOSIS — K219 Gastro-esophageal reflux disease without esophagitis: Secondary | ICD-10-CM | POA: Diagnosis not present

## 2018-06-19 DIAGNOSIS — K861 Other chronic pancreatitis: Secondary | ICD-10-CM

## 2018-06-19 DIAGNOSIS — I10 Essential (primary) hypertension: Secondary | ICD-10-CM

## 2018-06-19 DIAGNOSIS — K029 Dental caries, unspecified: Secondary | ICD-10-CM

## 2018-06-19 MED ORDER — SULFAMETHOXAZOLE-TRIMETHOPRIM 800-160 MG PO TABS: 1.0000 | ORAL_TABLET | Freq: Two times a day (BID) | ORAL | 0 refills | Status: DC

## 2018-06-19 MED ORDER — CHLORHEXIDINE GLUCONATE 0.12 % MT SOLN: OROMUCOSAL | 0 refills | Status: DC

## 2018-06-19 NOTE — Progress Notes (Signed)
Pt blood pressure elevated, informed provider. Stated it is always elevated when he comes in and he was upset this morning.

## 2018-06-19 NOTE — Progress Notes (Addendum)
[facility] [place], [place] [address]  Internal MEDICINE  Office Visit Note  Patient Name: Donald Scott  [id number]  [id number]  Date of Service: 06/19/2018  Chief Complaint  Patient presents with  . Medical Management of Chronic Issues    82month follow up    The patient states that he really needs to find a dentist. Has several teeth in back, right side of his mouth which may need to be pulled. They have chronic infection and swelling. Cause right side of his face to be swollen and painful.  The patient's blood pressure is elevated today. States that he has not taken his blood pressure medications yet today. Generally, his blood pressure is well controlled.       Current Medication: Outpatient Encounter Medications as of 06/19/2018  Medication Sig Note  . amLODipine (NORVASC) 5 MG tablet Take 1 tablet (5 mg total) by mouth daily.   . bisoprolol-hydrochlorothiazide (ZIAC) 5-6.25 MG tablet TK 1 T PO QD   . omeprazole (PRILOSEC) 40 MG capsule Take 1 capsule (40 mg total) by mouth daily.   Marland Kitchen telmisartan (MICARDIS) 40 MG tablet Take 1 tablet (40 mg total) by mouth daily.   Marland Kitchen albuterol (PROAIR HFA) 108 (90 Base) MCG/ACT inhaler Inhale 1 puff into the lungs every 6 (six) hours as needed for wheezing or shortness of breath.   Marland Kitchen aspirin 81 MG tablet Take by mouth. 05/22/2015: Received from: [place]  . chlorhexidine (PERIDEX) 0.12 % solution Swish and spit 15 mls twice daily after brushing.   . etodolac (LODINE) 300 MG capsule Take 1 capsule (300 mg total) by mouth 3 (three) times daily with meals as needed for moderate pain. (Patient not taking: Reported on 04/21/2017)   . fluticasone (FLONASE) 50 MCG/ACT nasal spray SHAKE LQ AND U 1 TO 2 SPRAYS IEN D FOR RHINITIS 05/22/2015: Received from: External Pharmacy  . lipase/protease/amylase (CREON) 36000 UNITS CPEP capsule  05/22/2015: Received from: [facility]  . naproxen  (NAPRELAN) 500 MG 24 hr tablet Take 1 tablet (500 mg total) by mouth 2 (two) times daily.   Marland Kitchen NIFEdipine (AFEDITAB CR) 30 MG 24 hr tablet Take by mouth. 05/22/2015: Received from: [place]  . NIFEdipine (PROCARDIA-XL/ADALAT-CC/NIFEDICAL-XL) 30 MG 24 hr tablet  05/22/2015: Received from: [facility]  . oxyCODONE-acetaminophen (PERCOCET/ROXICET) 5-325 MG tablet Take 1 tablet by mouth every 8 (eight) hours as needed for severe pain. Reported on 05/22/2015   . potassium chloride (KLOR-CON 10) 10 MEQ tablet Take 1 tablet (10 mEq total) by mouth daily.   Marland Kitchen sulfamethoxazole-trimethoprim (BACTRIM DS,SEPTRA DS) 800-160 MG tablet Take 1 tablet by mouth 2 (two) times daily.   . [DISCONTINUED] sulfamethoxazole-trimethoprim (BACTRIM DS,SEPTRA DS) 800-160 MG tablet Take 1 tablet by mouth 2 (two) times daily. (Patient not taking: Reported on 06/19/2018)    No facility-administered encounter medications on file as of 06/19/2018.     Surgical History: Past Surgical History:  Procedure Laterality Date  . CAROTID STENT  2010  . Vascular bypass graft      Medical History: Past Medical History:  Diagnosis Date  . Diabetes mellitus ([place]) 05/22/2015  . Gout 05/22/2015  . Hypertension     Family History: Family History  Problem Relation Age of Onset  . Arthritis Mother     Social History   Socioeconomic History  . Marital status: Married    Spouse name: Not on file  . Number of children:  Not on file  . Years of education: Not on file  . Highest education level: Not on file  Occupational History  . Not on file  Social Needs  . Financial resource strain: Not on file  . Food insecurity:    Worry: Not on file    Inability: Not on file  . Transportation needs:    Medical: Not on file    Non-medical: Not on file  Tobacco Use  . Smoking status: Current Every Day Smoker    Packs/day: 1.00    Types: Cigarettes  . Smokeless tobacco: Never Used  Substance and  Sexual Activity  . Alcohol use: Yes    Alcohol/week: 0.0 standard drinks  . Drug use: No  . Sexual activity: Not on file  Lifestyle  . Physical activity:    Days per week: Not on file    Minutes per session: Not on file  . Stress: Not on file  Relationships  . Social connections:    Talks on phone: Not on file    Gets together: Not on file    Attends religious service: Not on file    Active member of club or organization: Not on file    Attends meetings of clubs or organizations: Not on file    Relationship status: Not on file  . Intimate partner violence:    Fear of current or ex partner: Not on file    Emotionally abused: Not on file    Physically abused: Not on file    Forced sexual activity: Not on file  Other Topics Concern  . Not on file  Social History Narrative  . Not on file      Review of Systems  Constitutional: Negative for activity change, chills, fatigue and unexpected weight change.  HENT: Positive for congestion, dental problem and postnasal drip. Negative for rhinorrhea, sinus pressure, sinus pain, sneezing and sore throat.   Respiratory: Negative for cough, chest tightness, shortness of breath and wheezing. Stridor: adine.   Cardiovascular: Negative for chest pain and palpitations.       Blood pressure elevated today.   Gastrointestinal: Negative for abdominal distention, abdominal pain, constipation, diarrhea, nausea and vomiting.       Frequent loose bowel movements, not diarrhea. Has not noted a difference with increased dose of creon.   Endocrine: Negative for cold intolerance, heat intolerance, polydipsia and polyuria.  Musculoskeletal: Negative for neck pain.  Skin: Negative for rash.  Allergic/Immunologic: Positive for environmental allergies.  Neurological: Negative for dizziness, tremors, numbness and headaches.  Hematological: Positive for adenopathy.  Psychiatric/Behavioral: Negative for behavioral problems (Depression), decreased  concentration, dysphoric mood, sleep disturbance and suicidal ideas. The patient is not nervous/anxious.    Today's Vitals   06/19/18 0854 06/19/18 0915  BP: (!) 179/92 (!) 168/80  Pulse: 63   Resp: 16   SpO2: 93%   Weight: 156 lb 12.8 oz (71.1 kg)   Height: 5\' 7"  (1.702 m)    Body mass index is 24.56 kg/m. Physical Exam Vitals signs and nursing note reviewed.  Constitutional:      General: He is not in acute distress.    Appearance: Normal appearance. He is well-developed. He is not diaphoretic.  HENT:     Head: Normocephalic and atraumatic.     Nose: Rhinorrhea present.     Right Sinus: No maxillary sinus tenderness or frontal sinus tenderness.     Left Sinus: No maxillary sinus tenderness or frontal sinus tenderness.     Mouth/Throat:  Dentition: Dental tenderness, gingival swelling, dental caries and gum lesions present.     Pharynx: No oropharyngeal exudate.  Eyes:     Conjunctiva/sclera: Conjunctivae normal.     Pupils: Pupils are equal, round, and reactive to light.     Comments: Multiple dental caries present.   Neck:     Musculoskeletal: Normal range of motion and neck supple.     Thyroid: No thyromegaly.     Vascular: No carotid bruit or JVD.     Trachea: No tracheal deviation.  Cardiovascular:     Rate and Rhythm: Normal rate and regular rhythm.     Heart sounds: Normal heart sounds. No murmur. No friction rub. No gallop.   Pulmonary:     Effort: Pulmonary effort is normal. No respiratory distress.     Breath sounds: Normal breath sounds. No wheezing or rales.  Chest:     Chest wall: No tenderness.  Abdominal:     General: Bowel sounds are normal.     Palpations: Abdomen is soft.     Tenderness: There is no abdominal tenderness.  Musculoskeletal: Normal range of motion.     Comments: Arthritic changes present in multiple joints, especially the joints of hands and fingers.  Not point tenderness currently present.   Lymphadenopathy:     Cervical: Cervical  adenopathy present.  Skin:    General: Skin is warm and dry.     Capillary Refill: Capillary refill takes less than 2 seconds.  Neurological:     Mental Status: He is alert and oriented to person, place, and time.     Cranial Nerves: No cranial nerve deficit.  Psychiatric:        Behavior: Behavior normal.        Thought Content: Thought content normal.        Judgment: Judgment normal.    Assessment/Plan: 1. Dental caries Add bactrim DS bid for 21 days. Chlorhexidine mouth wash shouldbe used twice daily after brushing. Recommendations for dental care to be provided  - Ambulatory referral to Dentistry - sulfamethoxazole-trimethoprim (BACTRIM DS,SEPTRA DS) 800-160 MG tablet; Take 1 tablet by mouth 2 (two) times daily.  Dispense: 42 tablet; Refill: 0 - chlorhexidine (PERIDEX) 0.12 % solution; Swish and spit 15 mls twice daily after brushing.  Dispense: 120 mL; Refill: 0  2. Essential hypertension Elevated today, states he has not taken blood pressure medication yet today.  Generally well controlled. Continue bp medication as prescribed  3. Gastroesophageal reflux disease without esophagitis Continue omeprazole as prescribed   4. Other chronic pancreatitis ([place]) Continue regular visits with GI as scheduled.   General Counseling: Tillmon verbalizes understanding of the findings of todays visit and agrees with plan of treatment. I have discussed any further diagnostic evaluation that may be needed or ordered today. We also reviewed his medications today. he has been encouraged to call the office with any questions or concerns that should arise related to todays visit.  Hypertension Counseling:   The following hypertensive lifestyle modification were recommended and discussed:  1. Limiting alcohol intake to less than 1 oz/day of ethanol:(24 oz of beer or 8 oz of wine or 2 oz of 100-proof whiskey). 2. Take baby ASA 81 mg daily. 3. Importance of regular aerobic exercise and losing  weight. 4. Reduce dietary saturated fat and cholesterol intake for overall cardiovascular health. 5. Maintaining adequate dietary potassium, calcium, and magnesium intake. 6. Regular monitoring of the blood pressure. 7. Reduce sodium intake to less than 100 mmol/day (less than  2.3 gm of sodium or less than 6 gm of sodium choride)   This patient was seen by [place] with Dr Lavera Guise as a part of collaborative care agreement  Orders Placed This Encounter  Procedures  . Ambulatory referral to Dentistry    Meds ordered this encounter  Medications  . sulfamethoxazole-trimethoprim (BACTRIM DS,SEPTRA DS) 800-160 MG tablet    Sig: Take 1 tablet by mouth 2 (two) times daily.    Dispense:  42 tablet    Refill:  0    Order Specific Question:   Supervising Provider    Answer:   Lavera Guise [[id number]]  . chlorhexidine (PERIDEX) 0.12 % solution    Sig: Swish and spit 15 mls twice daily after brushing.    Dispense:  120 mL    Refill:  0    Order Specific Question:   Supervising Provider    Answer:   Lavera Guise [[id number]]    Time spent: 54 Minutes      Dr Lavera Guise Internal medicine

## 2018-06-22 ENCOUNTER — Other Ambulatory Visit: Payer: Self-pay | Admitting: Nurse Practitioner

## 2018-06-22 DIAGNOSIS — K219 Gastro-esophageal reflux disease without esophagitis: Secondary | ICD-10-CM

## 2018-06-22 MED ORDER — OMEPRAZOLE 40 MG PO CPDR: 40.0000 mg | DELAYED_RELEASE_CAPSULE | Freq: Every day | ORAL | 3 refills | Status: DC

## 2018-07-26 ENCOUNTER — Encounter: Payer: Self-pay | Admitting: Nurse Practitioner

## 2018-12-26 ENCOUNTER — Ambulatory Visit: Payer: Medicare HMO | Admitting: Nurse Practitioner

## 2019-03-12 DIAGNOSIS — E119 Type 2 diabetes mellitus without complications: Secondary | ICD-10-CM | POA: Diagnosis not present

## 2019-05-04 ENCOUNTER — Telehealth: Payer: Self-pay

## 2019-05-04 NOTE — Telephone Encounter (Signed)
Patient did not have vm set up was unable to confirm appointment for 05-08-19 ov.

## 2019-05-08 ENCOUNTER — Other Ambulatory Visit: Payer: Self-pay

## 2019-05-08 ENCOUNTER — Ambulatory Visit (INDEPENDENT_AMBULATORY_CARE_PROVIDER_SITE_OTHER): Payer: Medicare HMO | Admitting: Nurse Practitioner

## 2019-05-08 ENCOUNTER — Encounter: Payer: Self-pay | Admitting: Nurse Practitioner

## 2019-05-08 VITALS — BP 150/90 | HR 68 | Temp 97.9°F | Resp 16 | Ht 67.0 in | Wt 152.0 lb

## 2019-05-08 DIAGNOSIS — K219 Gastro-esophageal reflux disease without esophagitis: Secondary | ICD-10-CM

## 2019-05-08 DIAGNOSIS — I10 Essential (primary) hypertension: Secondary | ICD-10-CM | POA: Diagnosis not present

## 2019-05-08 DIAGNOSIS — F411 Generalized anxiety disorder: Secondary | ICD-10-CM | POA: Diagnosis not present

## 2019-05-08 DIAGNOSIS — E119 Type 2 diabetes mellitus without complications: Secondary | ICD-10-CM | POA: Diagnosis not present

## 2019-05-08 DIAGNOSIS — F17210 Nicotine dependence, cigarettes, uncomplicated: Secondary | ICD-10-CM

## 2019-05-08 DIAGNOSIS — K861 Other chronic pancreatitis: Secondary | ICD-10-CM | POA: Diagnosis not present

## 2019-05-08 DIAGNOSIS — Z0001 Encounter for general adult medical examination with abnormal findings: Secondary | ICD-10-CM | POA: Diagnosis not present

## 2019-05-08 MED ORDER — OMEPRAZOLE 40 MG PO CPDR: 40.0000 mg | DELAYED_RELEASE_CAPSULE | Freq: Every day | ORAL | 3 refills | Status: DC

## 2019-05-08 MED ORDER — ALPRAZOLAM 0.5 MG PO TABS
0.5000 mg | ORAL_TABLET | Freq: Two times a day (BID) | ORAL | 1 refills | Status: DC | PRN
Start: 2019-05-08 — End: 2019-12-18

## 2019-05-08 NOTE — Progress Notes (Signed)
[facility] [place], [place] [address]  Internal MEDICINE  Office Visit Note  Patient Name: Donald Scott  [id number]  [id number]  Date of Service: 05/09/2019  Chief Complaint  Patient presents with  . Annual Exam  . Hypertension  . Diabetes     The patient is here for health maintenance exam. He is having increased family related stress. He and his wife recently took custody of two year old grandson. States that his wife is really not helpful with taking care of the young boy. States that she "hollers" often and makes things very frustrating. He states that he has started drinking again because of if. He states that every night he is starting to drink one more drink than the day before. He would like to try something that is better for him than alcohol, but something that will help him to relax so he does not get frustrated and lose his temper.   Pt is here for routine health maintenance examination  Current Medication: Outpatient Encounter Medications as of 05/08/2019  Medication Sig Note  . albuterol (PROAIR HFA) 108 (90 Base) MCG/ACT inhaler Inhale 1 puff into the lungs every 6 (six) hours as needed for wheezing or shortness of breath.   Marland Kitchen amLODipine (NORVASC) 5 MG tablet Take 1 tablet (5 mg total) by mouth daily.   Marland Kitchen aspirin 81 MG tablet Take by mouth. 05/22/2015: Received from: [facility]  . bisoprolol-hydrochlorothiazide (ZIAC) 5-6.25 MG tablet TK 1 T PO QD   . chlorhexidine (PERIDEX) 0.12 % solution Swish and spit 15 mls twice daily after brushing.   . fluticasone (FLONASE) 50 MCG/ACT nasal spray SHAKE LQ AND U 1 TO 2 SPRAYS IEN D FOR RHINITIS 05/22/2015: Received from: External Pharmacy  . lipase/protease/amylase (CREON) 36000 UNITS CPEP capsule  05/22/2015: Received from: [facility]  . naproxen (NAPRELAN) 500 MG 24 hr tablet Take 1 tablet (500 mg total) by mouth 2 (two) times daily.   Marland Kitchen omeprazole (PRILOSEC) 40  MG capsule Take 1 capsule (40 mg total) by mouth daily.   Marland Kitchen telmisartan (MICARDIS) 40 MG tablet Take 1 tablet (40 mg total) by mouth daily.   . [DISCONTINUED] etodolac (LODINE) 300 MG capsule Take 1 capsule (300 mg total) by mouth 3 (three) times daily with meals as needed for moderate pain.   . [DISCONTINUED] omeprazole (PRILOSEC) 40 MG capsule Take 1 capsule (40 mg total) by mouth daily.   Marland Kitchen ALPRAZolam (XANAX) 0.5 MG tablet Take 1 tablet (0.5 mg total) by mouth 2 (two) times daily as needed for anxiety.   . potassium chloride (KLOR-CON 10) 10 MEQ tablet Take 1 tablet (10 mEq total) by mouth daily. (Patient not taking: Reported on 05/08/2019)   . [DISCONTINUED] NIFEdipine (AFEDITAB CR) 30 MG 24 hr tablet Take by mouth. 05/22/2015: Received from: [facility]  . [DISCONTINUED] NIFEdipine (PROCARDIA-XL/ADALAT-CC/NIFEDICAL-XL) 30 MG 24 hr tablet  05/22/2015: Received from: [facility]  . [DISCONTINUED] oxyCODONE-acetaminophen (PERCOCET/ROXICET) 5-325 MG tablet Take 1 tablet by mouth every 8 (eight) hours as needed for severe pain. Reported on 05/22/2015 (Patient not taking: Reported on 05/08/2019)   . [DISCONTINUED] sulfamethoxazole-trimethoprim (BACTRIM DS,SEPTRA DS) 800-160 MG tablet Take 1 tablet by mouth 2 (two) times daily. (Patient not taking: Reported on 05/08/2019)    No facility-administered encounter medications on file as of 05/08/2019.    Surgical History: Past Surgical History:  Procedure Laterality Date  . CAROTID STENT  2010  .  Vascular bypass graft      Medical History: Past Medical History:  Diagnosis Date  . Diabetes mellitus ([place]) 05/22/2015  . Gout 05/22/2015  . Hypertension     Family History: Family History  Problem Relation Age of Onset  . Arthritis Mother       Review of Systems  Constitutional: Negative for activity change, chills, fatigue and unexpected weight change.  HENT: Negative for congestion, postnasal drip, rhinorrhea,  sneezing and sore throat.   Respiratory: Negative for cough, chest tightness, shortness of breath and wheezing.   Cardiovascular: Negative for chest pain and palpitations.  Gastrointestinal: Negative for abdominal pain, constipation, diarrhea, nausea and vomiting.  Endocrine: Negative for cold intolerance, heat intolerance, polydipsia and polyuria.  Genitourinary: Negative for dysuria, frequency, hematuria and urgency.  Musculoskeletal: Negative for arthralgias, back pain, joint swelling and neck pain.  Skin: Negative for rash.  Allergic/Immunologic: Negative for environmental allergies.  Neurological: Negative for dizziness, tremors, numbness and headaches.  Hematological: Negative for adenopathy. Does not bruise/bleed easily.  Psychiatric/Behavioral: Positive for dysphoric mood. Negative for behavioral problems (Depression), sleep disturbance and suicidal ideas. The patient is nervous/anxious.        Patient reports increased irritability and worry, especially in the evenings. He repots increased alcohol use in the evenings.      Today's Vitals   05/08/19 1406  BP: (!) 150/90  Pulse: 68  Resp: 16  Temp: 97.9 F (36.6 C)  SpO2: 94%  Weight: 152 lb (68.9 kg)  Height: 5\' 7"  (1.702 m)   Body mass index is 23.81 kg/m.   Physical Exam Vitals and nursing note reviewed.  Constitutional:      General: He is not in acute distress.    Appearance: Normal appearance. He is well-developed. He is not diaphoretic.  HENT:     Head: Normocephalic and atraumatic.     Nose: Nose normal.     Mouth/Throat:     Pharynx: No oropharyngeal exudate.  Eyes:     Pupils: Pupils are equal, round, and reactive to light.  Neck:     Thyroid: No thyromegaly.     Vascular: No JVD.     Trachea: No tracheal deviation.  Cardiovascular:     Rate and Rhythm: Normal rate and regular rhythm.     Pulses: Normal pulses.     Heart sounds: Normal heart sounds. No murmur. No friction rub. No gallop.    Pulmonary:     Effort: Pulmonary effort is normal. No respiratory distress.     Breath sounds: Normal breath sounds. No wheezing or rales.  Chest:     Chest wall: No tenderness.  Abdominal:     General: Bowel sounds are normal.     Palpations: Abdomen is soft.     Tenderness: There is no abdominal tenderness.  Musculoskeletal:        General: Normal range of motion.     Cervical back: Normal range of motion and neck supple.  Lymphadenopathy:     Cervical: No cervical adenopathy.  Skin:    General: Skin is warm and dry.  Neurological:     General: No focal deficit present.     Mental Status: He is alert and oriented to person, place, and time.     Cranial Nerves: No cranial nerve deficit.  Psychiatric:        Attention and Perception: Attention and perception normal.        Mood and Affect: Affect normal. Mood is anxious.  Speech: Speech normal.        Behavior: Behavior normal. Behavior is cooperative.        Thought Content: Thought content normal.        Cognition and Memory: Cognition and memory normal.        Judgment: Judgment normal.    Depression screen [facility] 2/9 05/08/2019 12/19/2017 04/21/2017  Decreased Interest 0 0 0  Down, Depressed, Hopeless 0 0 0  PHQ - 2 Score 0 0 0    Functional Status Survey: Is the patient deaf or have difficulty hearing?: No Does the patient have difficulty seeing, even when wearing glasses/contacts?: No Does the patient have difficulty concentrating, remembering, or making decisions?: No Does the patient have difficulty walking or climbing stairs?: No Does the patient have difficulty dressing or bathing?: No Does the patient have difficulty doing errands alone such as visiting a doctor's office or shopping?: No  MMSE - Black Exam 05/08/2019 12/19/2017  Orientation to time 5 5  Orientation to Place 5 5  Registration 3 3  Attention/ Calculation 5 5  Recall 3 3  Language- name 2 objects 2 2  Language- repeat 1 1   Language- follow 3 step command 3 3  Language- read & follow direction 1 1  Write a sentence 1 1  Copy design 1 1  Copy design-comments - 1  Total score 30 30    Fall Risk  05/08/2019 12/19/2017 04/21/2017  Falls in the past year? 0 No No    Assessment/Plan: 1. Encounter for general adult medical examination with abnormal findings Annual health maintenance exam today. Lab slip given to check routine fasting labs.   2. Gastroesophageal reflux disease without esophagitis Continue omeprazole 40mg  daily when needed  - omeprazole (PRILOSEC) 40 MG capsule; Take 1 capsule (40 mg total) by mouth daily.  Dispense: 30 capsule; Refill: 3  3. Essential hypertension Better today. Continue medication as prescribed   4. Type 2 diabetes mellitus without complication, without long-term current use of insulin (HCC) Check HgbA1c with routine labs.   5. Other chronic pancreatitis ([place]) Continue creon as prescribed. Visits with Gi as scheduled. Discussed risks to pancreas if continues regular alcohol use.   6. Generalized anxiety disorder Start alprazolam 0.5mg . may take up to twice daily if needed for anxiety and worry. Advised he not drink alcohol of any sort while taking this medication. Also advised him against driving or working while taking this medication. He voiced understanding and agreement.  - ALPRAZolam (XANAX) 0.5 MG tablet; Take 1 tablet (0.5 mg total) by mouth 2 (two) times daily as needed for anxiety.  Dispense: 60 tablet; Refill: 1  7. Nicotine dependence, cigarettes, uncomplicated Smoking cessation discussed. Patient is not ready to quit smoking at this time.   General Counseling: Johneric verbalizes understanding of the findings of todays visit and agrees with plan of treatment. I have discussed any further diagnostic evaluation that may be needed or ordered today. We also reviewed his medications today. he has been encouraged to call the office with any questions or concerns that  should arise related to todays visit.    Counseling:  Reviewed risks and possible side effects associated with taking opiates, benzodiazepines and other CNS depressants. Combination of these could cause dizziness and drowsiness. Advised patient not to drive or operate machinery when taking these medications, as patient's and other's life can be at risk and will have consequences. Patient verbalized understanding in this matter. Dependence and abuse for these drugs will  be monitored closely. A Controlled substance policy and procedure is on file which allows [facility] to order a urine drug screen test at any visit. Patient understands and agrees with the plan  This patient was seen by Leretha Pol FNP Collaboration with Dr Lavera Guise as a part of collaborative care agreement  Meds ordered this encounter  Medications  . omeprazole (PRILOSEC) 40 MG capsule    Sig: Take 1 capsule (40 mg total) by mouth daily.    Dispense:  30 capsule    Refill:  3    Order Specific Question:   Supervising Provider    Answer:   Lavera Guise [id number]  . ALPRAZolam (XANAX) 0.5 MG tablet    Sig: Take 1 tablet (0.5 mg total) by mouth 2 (two) times daily as needed for anxiety.    Dispense:  60 tablet    Refill:  1    Order Specific Question:   Supervising Provider    Answer:   Lavera Guise [id number]    Total time spent: 108 Minutes  Time spent includes review of chart, medications, test results, and follow up plan with the patient.     Lavera Guise, MD  Internal Medicine

## 2019-06-15 ENCOUNTER — Telehealth: Payer: Self-pay

## 2019-06-15 NOTE — Telephone Encounter (Signed)
NO VM UNABLE TO CONFIRM 06-19-19 OV.

## 2019-06-19 ENCOUNTER — Ambulatory Visit: Payer: Medicare HMO | Admitting: Nurse Practitioner

## 2019-07-05 ENCOUNTER — Telehealth: Payer: Self-pay

## 2019-07-05 NOTE — Telephone Encounter (Signed)
No vm set up unable to confirm and screen for 07-10-19 ov.

## 2019-07-10 ENCOUNTER — Ambulatory Visit: Payer: Medicare HMO | Admitting: Nurse Practitioner

## 2019-09-04 ENCOUNTER — Other Ambulatory Visit: Payer: Self-pay

## 2019-09-04 DIAGNOSIS — K219 Gastro-esophageal reflux disease without esophagitis: Secondary | ICD-10-CM

## 2019-09-04 MED ORDER — OMEPRAZOLE 40 MG PO CPDR: 40.0000 mg | DELAYED_RELEASE_CAPSULE | Freq: Every day | ORAL | 3 refills | Status: DC

## 2019-09-24 ENCOUNTER — Other Ambulatory Visit: Payer: Self-pay

## 2019-09-24 DIAGNOSIS — K219 Gastro-esophageal reflux disease without esophagitis: Secondary | ICD-10-CM

## 2019-09-24 MED ORDER — OMEPRAZOLE 40 MG PO CPDR: 40.0000 mg | DELAYED_RELEASE_CAPSULE | Freq: Every day | ORAL | 0 refills | Status: DC

## 2019-09-26 ENCOUNTER — Other Ambulatory Visit: Payer: Self-pay

## 2019-09-26 DIAGNOSIS — K219 Gastro-esophageal reflux disease without esophagitis: Secondary | ICD-10-CM

## 2019-09-26 DIAGNOSIS — I10 Essential (primary) hypertension: Secondary | ICD-10-CM

## 2019-09-26 MED ORDER — OMEPRAZOLE 40 MG PO CPDR: 40.0000 mg | DELAYED_RELEASE_CAPSULE | Freq: Every day | ORAL | 0 refills | Status: DC

## 2019-09-26 MED ORDER — AMLODIPINE BESYLATE 5 MG PO TABS: 5.0000 mg | ORAL_TABLET | Freq: Every day | ORAL | 3 refills | Status: DC

## 2019-10-02 ENCOUNTER — Other Ambulatory Visit: Payer: Self-pay

## 2019-10-02 DIAGNOSIS — I10 Essential (primary) hypertension: Secondary | ICD-10-CM

## 2019-10-02 MED ORDER — AMLODIPINE BESYLATE 5 MG PO TABS: 5.0000 mg | ORAL_TABLET | Freq: Every day | ORAL | 0 refills | Status: DC

## 2019-11-07 ENCOUNTER — Other Ambulatory Visit: Payer: Self-pay

## 2019-11-07 DIAGNOSIS — K219 Gastro-esophageal reflux disease without esophagitis: Secondary | ICD-10-CM

## 2019-11-07 DIAGNOSIS — I10 Essential (primary) hypertension: Secondary | ICD-10-CM

## 2019-11-07 MED ORDER — OMEPRAZOLE 40 MG PO CPDR: 40.0000 mg | DELAYED_RELEASE_CAPSULE | Freq: Every day | ORAL | 0 refills | Status: DC

## 2019-11-07 MED ORDER — AMLODIPINE BESYLATE 5 MG PO TABS: 5.0000 mg | ORAL_TABLET | Freq: Every day | ORAL | 0 refills | Status: DC

## 2019-11-27 ENCOUNTER — Encounter: Payer: Self-pay | Admitting: Nurse Practitioner

## 2019-11-27 ENCOUNTER — Other Ambulatory Visit: Payer: Self-pay

## 2019-11-27 ENCOUNTER — Ambulatory Visit (INDEPENDENT_AMBULATORY_CARE_PROVIDER_SITE_OTHER): Payer: Medicare HMO | Admitting: Nurse Practitioner

## 2019-11-27 VITALS — BP 190/88 | HR 66 | Temp 97.7°F | Resp 16 | Ht 67.0 in | Wt 154.6 lb

## 2019-11-27 DIAGNOSIS — F17210 Nicotine dependence, cigarettes, uncomplicated: Secondary | ICD-10-CM

## 2019-11-27 DIAGNOSIS — E119 Type 2 diabetes mellitus without complications: Secondary | ICD-10-CM | POA: Diagnosis not present

## 2019-11-27 DIAGNOSIS — K861 Other chronic pancreatitis: Secondary | ICD-10-CM | POA: Diagnosis not present

## 2019-11-27 DIAGNOSIS — I10 Essential (primary) hypertension: Secondary | ICD-10-CM | POA: Diagnosis not present

## 2019-11-27 LAB — POCT GLYCOSYLATED HEMOGLOBIN (HGB A1C): Hemoglobin A1C: 5.9 % — AB (ref 4.0–5.6)

## 2019-11-27 MED ORDER — BISOPROLOL-HYDROCHLOROTHIAZIDE 5-6.25 MG PO TABS: ORAL_TABLET | ORAL | 1 refills | Status: DC

## 2019-11-27 MED ORDER — TRUE METRIX LEVEL 1 LOW VI SOLN: 3 refills | Status: DC

## 2019-11-27 MED ORDER — AMLODIPINE BESYLATE 5 MG PO TABS: 5.0000 mg | ORAL_TABLET | Freq: Every day | ORAL | 1 refills | Status: DC

## 2019-11-27 MED ORDER — ALCOHOL SWABS PADS: MEDICATED_PAD | 3 refills | Status: DC

## 2019-11-27 MED ORDER — TRUEPLUS LANCETS 30G MISC: 3 refills | Status: AC

## 2019-11-27 MED ORDER — TRUEPLUS LANCETS 30G MISC: 3 refills | Status: DC

## 2019-11-27 MED ORDER — AMLODIPINE BESYLATE 5 MG PO TABS: 10.0000 mg | ORAL_TABLET | Freq: Every day | ORAL | 1 refills | Status: DC

## 2019-11-27 NOTE — Progress Notes (Signed)
[facility] [place], [place] [address]  Internal MEDICINE  Office Visit Note  Patient Name: Donald Scott  [id number]  [id number]  Date of Service: 12/12/2019  Chief Complaint  Patient presents with  . Follow-up    refill request bisoprolol  . Diabetes  . Hypertension  . Quality Metric Gaps    HepC, PNA, colonoscopy    The patient is here for routine follow up visit. His blood pressure is very elevated today as he has been off medications. States that he ran out and was unable to get new prescriptions. States that he is feeling well. Continues to have increased family related stress. He does have history of diabetes, but this has been controlled with diet alone for some time. He is overdue to have routine, fasting labs done. He is also due for screening colonoscopy but does not wish to have this done right now.       Current Medication: Outpatient Encounter Medications as of 11/27/2019  Medication Sig Note  . albuterol (PROAIR HFA) 108 (90 Base) MCG/ACT inhaler Inhale 1 puff into the lungs every 6 (six) hours as needed for wheezing or shortness of breath.   . Alcohol Swabs PADS Use with cleaning area to test blood sugar twice a day   . amLODipine (NORVASC) 5 MG tablet Take 2 tablets (10 mg total) by mouth daily.   . Blood Glucose Calibration (TRUE METRIX LEVEL 1) Low SOLN Use as directed to check blood sugar twice a day TRUE METRIX LEVEL 1 CTRL SOLN   . fluticasone (FLONASE) 50 MCG/ACT nasal spray SHAKE LQ AND U 1 TO 2 SPRAYS IEN D FOR RHINITIS 05/22/2015: Received from: External Pharmacy  . lipase/protease/amylase (CREON) 36000 UNITS CPEP capsule  05/22/2015: Received from: [facility]  . potassium chloride (KLOR-CON 10) 10 MEQ tablet Take 1 tablet (10 mEq total) by mouth daily.   . TRUEplus Lancets 30G MISC Use to test blood sugar twice a day   . [DISCONTINUED] amLODipine (NORVASC) 5 MG tablet Take 1 tablet (5 mg total) by mouth daily.    . [DISCONTINUED] amLODipine (NORVASC) 5 MG tablet Take 1 tablet (5 mg total) by mouth daily.   . [DISCONTINUED] omeprazole (PRILOSEC) 40 MG capsule Take 1 capsule (40 mg total) by mouth daily.   Marland Kitchen ALPRAZolam (XANAX) 0.5 MG tablet Take 1 tablet (0.5 mg total) by mouth 2 (two) times daily as needed for anxiety. (Patient not taking: Reported on 11/27/2019)   . aspirin 81 MG tablet Take by mouth. (Patient not taking: Reported on 11/27/2019) 05/22/2015: Received from: [facility]  . bisoprolol-hydrochlorothiazide (ZIAC) 5-6.25 MG tablet TK 1 T PO QD   . chlorhexidine (PERIDEX) 0.12 % solution Swish and spit 15 mls twice daily after brushing. (Patient not taking: Reported on 11/27/2019)   . naproxen (NAPRELAN) 500 MG 24 hr tablet Take 1 tablet (500 mg total) by mouth 2 (two) times daily. (Patient not taking: Reported on 11/27/2019)   . telmisartan (MICARDIS) 40 MG tablet Take 1 tablet (40 mg total) by mouth daily. (Patient not taking: Reported on 11/27/2019)   . [DISCONTINUED] bisoprolol-hydrochlorothiazide (ZIAC) 5-6.25 MG tablet TK 1 T PO QD (Patient not taking: Reported on 11/27/2019)   . [DISCONTINUED] bisoprolol-hydrochlorothiazide (ZIAC) 5-6.25 MG tablet TK 1 T PO QD    No facility-administered encounter medications on file as of 11/27/2019.    Surgical History: Past Surgical History:  Procedure Laterality Date  . CAROTID STENT  2010  .  Vascular bypass graft      Medical History: Past Medical History:  Diagnosis Date  . Diabetes mellitus ([place]) 05/22/2015  . Gout 05/22/2015  . Hypertension     Family History: Family History  Problem Relation Age of Onset  . Arthritis Mother     Social History   Socioeconomic History  . Marital status: Married    Spouse name: Not on file  . Number of children: Not on file  . Years of education: Not on file  . Highest education level: Not on file  Occupational History  . Not on file  Tobacco Use  . Smoking status: Current Every  Day Smoker    Packs/day: 1.00    Types: Cigarettes  . Smokeless tobacco: Never Used  Substance and Sexual Activity  . Alcohol use: Yes    Alcohol/week: 18.0 standard drinks    Types: 18 Cans of beer per week  . Drug use: No  . Sexual activity: Not on file  Other Topics Concern  . Not on file  Social History Narrative  . Not on file   Social Determinants of Health   Financial Resource Strain:   . Difficulty of Paying Living Expenses: Not on file  Food Insecurity:   . Worried About Charity fundraiser in the Last Year: Not on file  . Ran Out of Food in the Last Year: Not on file  Transportation Needs:   . Lack of Transportation (Medical): Not on file  . Lack of Transportation (Non-Medical): Not on file  Physical Activity:   . Days of Exercise per Week: Not on file  . Minutes of Exercise per Session: Not on file  Stress:   . Feeling of Stress : Not on file  Social Connections:   . Frequency of Communication with Friends and Family: Not on file  . Frequency of Social Gatherings with Friends and Family: Not on file  . Attends Religious Services: Not on file  . Active Member of Clubs or Organizations: Not on file  . Attends Archivist Meetings: Not on file  . Marital Status: Not on file  Intimate Partner Violence:   . Fear of Current or Ex-Partner: Not on file  . Emotionally Abused: Not on file  . Physically Abused: Not on file  . Sexually Abused: Not on file      Review of Systems  Constitutional: Negative for activity change, chills, fatigue and unexpected weight change.  HENT: Negative for congestion, postnasal drip, rhinorrhea, sneezing and sore throat.   Respiratory: Negative for cough, chest tightness, shortness of breath and wheezing.   Cardiovascular: Negative for chest pain and palpitations.       Blood pressure very elevated today.   Gastrointestinal: Negative for abdominal pain, constipation, diarrhea, nausea and vomiting.  Endocrine: Negative for  cold intolerance, heat intolerance, polydipsia and polyuria.       Blood sugar well managed without medications.   Musculoskeletal: Negative for arthralgias, back pain, joint swelling and neck pain.  Skin: Negative for rash.  Allergic/Immunologic: Negative for environmental allergies.  Neurological: Negative for dizziness, tremors, numbness and headaches.  Hematological: Negative for adenopathy. Does not bruise/bleed easily.  Psychiatric/Behavioral: Positive for dysphoric mood. Negative for behavioral problems (Depression), sleep disturbance and suicidal ideas. The patient is nervous/anxious.        Patient reports increased irritability and worry, especially in the evenings. He repots increased alcohol use in the evenings.     Today's Vitals   11/27/19 1554  BP: Marland Kitchen)  190/88  Pulse: 66  Resp: 16  Temp: 97.7 F (36.5 C)  SpO2: 98%  Weight: 154 lb 9.6 oz (70.1 kg)  Height: 5\' 7"  (1.702 m)   Body mass index is 24.21 kg/m.  Physical Exam Vitals and nursing note reviewed.  Constitutional:      General: He is not in acute distress.    Appearance: Normal appearance. He is well-developed. He is not diaphoretic.  HENT:     Head: Normocephalic and atraumatic.     Nose: Nose normal.     Mouth/Throat:     Pharynx: No oropharyngeal exudate.  Eyes:     Pupils: Pupils are equal, round, and reactive to light.  Neck:     Thyroid: No thyromegaly.     Vascular: No carotid bruit or JVD.     Trachea: No tracheal deviation.  Cardiovascular:     Rate and Rhythm: Normal rate and regular rhythm.     Pulses: Normal pulses.     Heart sounds: Normal heart sounds. No murmur heard.  No friction rub. No gallop.   Pulmonary:     Effort: Pulmonary effort is normal. No respiratory distress.     Breath sounds: Normal breath sounds. No wheezing or rales.  Chest:     Chest wall: No tenderness.  Abdominal:     General: Bowel sounds are normal.     Palpations: Abdomen is soft.     Tenderness: There is  no abdominal tenderness.  Musculoskeletal:        General: Normal range of motion.     Cervical back: Normal range of motion and neck supple.  Lymphadenopathy:     Cervical: No cervical adenopathy.  Skin:    General: Skin is warm and dry.  Neurological:     General: No focal deficit present.     Mental Status: He is alert and oriented to person, place, and time.     Cranial Nerves: No cranial nerve deficit.  Psychiatric:        Attention and Perception: Attention and perception normal.        Mood and Affect: Affect normal. Mood is anxious.        Speech: Speech normal.        Behavior: Behavior normal. Behavior is cooperative.        Thought Content: Thought content normal.        Cognition and Memory: Cognition and memory normal.        Judgment: Judgment normal.     Comments: Mood improved from most recent visit .    Assessment/Plan: 1. Diet-controlled type 2 diabetes mellitus (HCC) - POCT HgB A1C 5.9 today. coitnue to control with diet and exercise. Will monitor closely.   2. Essential hypertension bp very elevted as he has been out of medications for some time. Restart amlodipine 10mg  and Ziac 5/3.25mg  tablets dally. Limit salt and increase water in the diet. Discussed smoking cessation as a way to help lower blood pressure as well.  - bisoprolol-hydrochlorothiazide (ZIAC) 5-6.25 MG tablet; TK 1 T PO QD  Dispense: 90 tablet; Refill: 1 - amLODipine (NORVASC) 5 MG tablet; Take 2 tablets (10 mg total) by mouth daily.  Dispense: 180 tablet; Refill: 1  3. Other chronic pancreatitis ([place]) conitnue creon as previously prescribed   4. Nicotine dependence, cigarettes, uncomplicated S,pking cessation discussed. Patient not currently ready to quit.   General Counseling: Mad[place] verbalizes understanding of the findings of todays visit and agrees with plan of treatment. I have  discussed any further diagnostic evaluation that may be needed or ordered today. We also reviewed his  medications today. he has been encouraged to call the office with any questions or concerns that should arise related to todays visit.  Hypertension Counseling:   The following hypertensive lifestyle modification were recommended and discussed:  1. Limiting alcohol intake to less than 1 oz/day of ethanol:(24 oz of beer or 8 oz of wine or 2 oz of 100-proof whiskey). 2. Take baby ASA 81 mg daily. 3. Importance of regular aerobic exercise and losing weight. 4. Reduce dietary saturated fat and cholesterol intake for overall cardiovascular health. 5. Maintaining adequate dietary potassium, calcium, and magnesium intake. 6. Regular monitoring of the blood pressure. 7. Reduce sodium intake to less than 100 mmol/day (less than 2.3 gm of sodium or less than 6 gm of sodium choride)   This patient was seen by [place] with Dr Lavera Guise as a part of collaborative care agreement  Orders Placed This Encounter  Procedures  . POCT HgB A1C    Meds ordered this encounter  Medications  . DISCONTD: bisoprolol-hydrochlorothiazide (ZIAC) 5-6.25 MG tablet    Sig: TK 1 T PO QD    Dispense:  30 tablet    Refill:  1    Order Specific Question:   Supervising Provider    Answer:   Lavera Guise [[id number]]  . DISCONTD: amLODipine (NORVASC) 5 MG tablet    Sig: Take 1 tablet (5 mg total) by mouth daily.    Dispense:  90 tablet    Refill:  1    Patient needs to be seen for further refills.    Order Specific Question:   Supervising Provider    Answer:   Lavera Guise [[id number]]  . bisoprolol-hydrochlorothiazide (ZIAC) 5-6.25 MG tablet    Sig: TK 1 T PO QD    Dispense:  90 tablet    Refill:  1    Order Specific Question:   Supervising Provider    Answer:   Lavera Guise [[id number]]  . amLODipine (NORVASC) 5 MG tablet    Sig: Take 2 tablets (10 mg total) by mouth daily.    Dispense:  180 tablet    Refill:  1    Please increase to two tablets daily    Order Specific Question:   Supervising  Provider    Answer:   Lavera Guise [[id number]]    Total time spent: 30 Minutes Time spent includes review of chart, medications, test results, and follow up plan with the patient.      Dr Lavera Guise Internal medicine

## 2019-11-28 ENCOUNTER — Other Ambulatory Visit: Payer: Self-pay

## 2019-11-28 DIAGNOSIS — I10 Essential (primary) hypertension: Secondary | ICD-10-CM

## 2019-12-03 ENCOUNTER — Other Ambulatory Visit: Payer: Self-pay

## 2019-12-03 DIAGNOSIS — K219 Gastro-esophageal reflux disease without esophagitis: Secondary | ICD-10-CM

## 2019-12-03 MED ORDER — OMEPRAZOLE 40 MG PO CPDR: 40.0000 mg | DELAYED_RELEASE_CAPSULE | Freq: Every day | ORAL | 0 refills | Status: DC

## 2019-12-13 ENCOUNTER — Telehealth: Payer: Self-pay

## 2019-12-13 NOTE — Telephone Encounter (Signed)
Confirmed and screened for 12-18-19 ov. 

## 2019-12-17 ENCOUNTER — Other Ambulatory Visit: Payer: Self-pay | Admitting: Nurse Practitioner

## 2019-12-17 DIAGNOSIS — Z125 Encounter for screening for malignant neoplasm of prostate: Secondary | ICD-10-CM | POA: Diagnosis not present

## 2019-12-17 DIAGNOSIS — I10 Essential (primary) hypertension: Secondary | ICD-10-CM | POA: Diagnosis not present

## 2019-12-17 DIAGNOSIS — Z0001 Encounter for general adult medical examination with abnormal findings: Secondary | ICD-10-CM | POA: Diagnosis not present

## 2019-12-17 DIAGNOSIS — E782 Mixed hyperlipidemia: Secondary | ICD-10-CM | POA: Diagnosis not present

## 2019-12-18 ENCOUNTER — Encounter: Payer: Self-pay | Admitting: Hospice and Palliative Medicine

## 2019-12-18 ENCOUNTER — Other Ambulatory Visit: Payer: Self-pay

## 2019-12-18 ENCOUNTER — Ambulatory Visit (INDEPENDENT_AMBULATORY_CARE_PROVIDER_SITE_OTHER): Payer: Medicare HMO | Admitting: Hospice and Palliative Medicine

## 2019-12-18 VITALS — BP 172/84 | HR 58 | Temp 98.4°F | Resp 16 | Ht 67.0 in | Wt 156.8 lb

## 2019-12-18 DIAGNOSIS — F17210 Nicotine dependence, cigarettes, uncomplicated: Secondary | ICD-10-CM

## 2019-12-18 DIAGNOSIS — K625 Hemorrhage of anus and rectum: Secondary | ICD-10-CM

## 2019-12-18 DIAGNOSIS — I10 Essential (primary) hypertension: Secondary | ICD-10-CM

## 2019-12-18 DIAGNOSIS — Z91199 Patient's noncompliance with other medical treatment and regimen due to unspecified reason: Secondary | ICD-10-CM

## 2019-12-18 DIAGNOSIS — Z9119 Patient's noncompliance with other medical treatment and regimen: Secondary | ICD-10-CM

## 2019-12-18 DIAGNOSIS — Z23 Encounter for immunization: Secondary | ICD-10-CM

## 2019-12-18 LAB — COMPREHENSIVE METABOLIC PANEL
ALT: 18 IU/L (ref 0–44)
AST: 18 IU/L (ref 0–40)
Albumin/Globulin Ratio: 1.4 (ref 1.2–2.2)
Albumin: 4.4 g/dL (ref 3.7–4.7)
Alkaline Phosphatase: 94 IU/L (ref 44–121)
BUN/Creatinine Ratio: 15 (ref 10–24)
BUN: 16 mg/dL (ref 8–27)
Bilirubin Total: 0.6 mg/dL (ref 0.0–1.2)
CO2: 25 mmol/L (ref 20–29)
Calcium: 9 mg/dL (ref 8.6–10.2)
Chloride: 100 mmol/L (ref 96–106)
Creatinine, Ser: 1.1 mg/dL (ref 0.76–1.27)
GFR calc Af Amer: 78 mL/min/{1.73_m2} (ref >59)
GFR calc non Af Amer: 67 mL/min/{1.73_m2} (ref >59)
Globulin, Total: 3.2 g/dL (ref 1.5–4.5)
Glucose: 143 mg/dL — ABNORMAL HIGH (ref 65–99)
Potassium: 3.9 mmol/L (ref 3.5–5.2)
Sodium: 136 mmol/L (ref 134–144)
Total Protein: 7.6 g/dL (ref 6.0–8.5)

## 2019-12-18 LAB — LIPID PANEL WITH LDL/HDL RATIO
Cholesterol, Total: 152 mg/dL (ref 100–199)
HDL: 63 mg/dL (ref >39)
LDL Chol Calc (NIH): 72 mg/dL (ref 0–99)
LDL/HDL Ratio: 1.1 ratio (ref 0.0–3.6)
Triglycerides: 89 mg/dL (ref 0–149)
VLDL Cholesterol Cal: 17 mg/dL (ref 5–40)

## 2019-12-18 LAB — CBC
Hematocrit: 43.7 % (ref 37.5–51.0)
Hemoglobin: 13.8 g/dL (ref 13.0–17.7)
MCH: 29.6 pg (ref 26.6–33.0)
MCHC: 31.6 g/dL (ref 31.5–35.7)
MCV: 94 fL (ref 79–97)
Platelets: 159 10*3/uL (ref 150–450)
RBC: 4.66 x10E6/uL (ref 4.14–5.80)
RDW: 11.4 % — ABNORMAL LOW (ref 11.6–15.4)
WBC: 7.9 10*3/uL (ref 3.4–10.8)

## 2019-12-18 LAB — T4, FREE: Free T4: 1.01 ng/dL (ref 0.82–1.77)

## 2019-12-18 LAB — PSA: Prostate Specific Ag, Serum: 1 ng/mL (ref 0.0–4.0)

## 2019-12-18 LAB — TSH: TSH: 1.9 u[IU]/mL (ref 0.450–4.500)

## 2019-12-18 LAB — HEPATITIS C ANTIBODY: Hep C Virus Ab: <0.1 s/co ratio (ref 0.0–0.9)

## 2019-12-18 MED ORDER — LOSARTAN POTASSIUM-HCTZ 50-12.5 MG PO TABS: 1.0000 | ORAL_TABLET | Freq: Every day | ORAL | 3 refills | Status: DC

## 2019-12-18 NOTE — Progress Notes (Signed)
[facility] [place], [place] [address]  Internal MEDICINE  Office Visit Note  Patient Name: Donald Scott  [id number]  [id number]  Date of Service: 12/26/2019  Chief Complaint  Patient presents with  . Follow-up    checking on bp meds  . Diabetes  . Hypertension    HPI Patient is here for routine follow-up We have been working on lowering his blood pressure as he is planning to have his remaining teeth pulled and to be fit for dentures, his dentist told him his blood pressure will need to be better controlled before undergoing procedure He mentions to me that he had some rectal bleeding last month that lasted about 4-5 days, he did not seek emergency care, bleeding resolved spontaneously Continues to smokes about a pack a day Quit drinking about two months ago, was drinking about a shot of liquor a night previously He says he is reluctant to have procedures done or going to see many different doctors  Current Medication: Outpatient Encounter Medications as of 12/18/2019  Medication Sig Note  . albuterol (PROAIR HFA) 108 (90 Base) MCG/ACT inhaler Inhale 1 puff into the lungs every 6 (six) hours as needed for wheezing or shortness of breath.   . Alcohol Swabs PADS Use with cleaning area to test blood sugar twice a day   . amLODipine (NORVASC) 5 MG tablet Take 2 tablets (10 mg total) by mouth daily.   . Blood Glucose Calibration (TRUE METRIX LEVEL 1) Low SOLN Use as directed to check blood sugar twice a day TRUE METRIX LEVEL 1 CTRL SOLN   . fluticasone (FLONASE) 50 MCG/ACT nasal spray SHAKE LQ AND U 1 TO 2 SPRAYS IEN D FOR RHINITIS 05/22/2015: Received from: External Pharmacy  . omeprazole (PRILOSEC) 40 MG capsule Take 1 capsule (40 mg total) by mouth daily.   . TRUEplus Lancets 30G MISC Use to test blood sugar twice a day   . [DISCONTINUED] ALPRAZolam (XANAX) 0.5 MG tablet Take 1 tablet (0.5 mg total) by mouth 2 (two) times daily as needed for anxiety.    . [DISCONTINUED] aspirin 81 MG tablet Take by mouth. 05/22/2015: Received from: [facility]  . [DISCONTINUED] bisoprolol-hydrochlorothiazide (ZIAC) 5-6.25 MG tablet TK 1 T PO QD   . [DISCONTINUED] chlorhexidine (PERIDEX) 0.12 % solution Swish and spit 15 mls twice daily after brushing.   . [DISCONTINUED] lipase/protease/amylase (CREON) 36000 UNITS CPEP capsule  05/22/2015: Received from: [facility]  . [DISCONTINUED] naproxen (NAPRELAN) 500 MG 24 hr tablet Take 1 tablet (500 mg total) by mouth 2 (two) times daily.   . [DISCONTINUED] potassium chloride (KLOR-CON 10) 10 MEQ tablet Take 1 tablet (10 mEq total) by mouth daily.   . [DISCONTINUED] telmisartan (MICARDIS) 40 MG tablet Take 1 tablet (40 mg total) by mouth daily.   Marland Kitchen losartan-hydrochlorothiazide (HYZAAR) 50-12.5 MG tablet Take 1 tablet by mouth daily.    No facility-administered encounter medications on file as of 12/18/2019.    Surgical History: Past Surgical History:  Procedure Laterality Date  . CAROTID STENT  2010  . Vascular bypass graft      Medical History: Past Medical History:  Diagnosis Date  . Diabetes mellitus ([place]) 05/22/2015  . Gout 05/22/2015  . Hypertension     Family History: Family History  Problem Relation Age of Onset  . Arthritis Mother     Social History   Socioeconomic History  . Marital status: Married    Spouse name: Not on  file  . Number of children: Not on file  . Years of education: Not on file  . Highest education level: Not on file  Occupational History  . Not on file  Tobacco Use  . Smoking status: Current Every Day Smoker    Packs/day: 1.00    Types: Cigarettes  . Smokeless tobacco: Never Used  Substance and Sexual Activity  . Alcohol use: Yes    Alcohol/week: 18.0 standard drinks    Types: 18 Cans of beer per week  . Drug use: No  . Sexual activity: Not on file  Other Topics Concern  . Not on file  Social History Narrative  . Not on file    Social Determinants of Health   Financial Resource Strain:   . Difficulty of Paying Living Expenses: Not on file  Food Insecurity:   . Worried About Charity fundraiser in the Last Year: Not on file  . Ran Out of Food in the Last Year: Not on file  Transportation Needs:   . Lack of Transportation (Medical): Not on file  . Lack of Transportation (Non-Medical): Not on file  Physical Activity:   . Days of Exercise per Week: Not on file  . Minutes of Exercise per Session: Not on file  Stress:   . Feeling of Stress : Not on file  Social Connections:   . Frequency of Communication with Friends and Family: Not on file  . Frequency of Social Gatherings with Friends and Family: Not on file  . Attends Religious Services: Not on file  . Active Member of Clubs or Organizations: Not on file  . Attends Archivist Meetings: Not on file  . Marital Status: Not on file  Intimate Partner Violence:   . Fear of Current or Ex-Partner: Not on file  . Emotionally Abused: Not on file  . Physically Abused: Not on file  . Sexually Abused: Not on file   Review of Systems  Constitutional: Negative for chills, fatigue and unexpected weight change.  HENT: Negative for congestion, postnasal drip, rhinorrhea, sneezing and sore throat.   Eyes: Negative for photophobia and visual disturbance.  Respiratory: Negative for cough, chest tightness and shortness of breath.   Cardiovascular: Negative for chest pain, palpitations and leg swelling.  Gastrointestinal: Negative for abdominal pain, constipation, diarrhea, nausea and vomiting.  Genitourinary: Negative for dysuria and frequency.  Musculoskeletal: Negative for arthralgias, back pain, joint swelling and neck pain.  Skin: Negative for rash.  Neurological: Negative for dizziness, tremors, facial asymmetry, weakness, light-headedness, numbness and headaches.  Hematological: Negative for adenopathy. Does not bruise/bleed easily.   Psychiatric/Behavioral: Negative for behavioral problems (Depression), sleep disturbance and suicidal ideas. The patient is not nervous/anxious.    Vital Signs: BP (!) 172/84   Pulse (!) 58   Temp 98.4 F (36.9 C)   Resp 16   Ht 5\' 7"  (1.702 m)   Wt 156 lb 12.8 oz (71.1 kg)   SpO2 100%   BMI 24.56 kg/m   Physical Exam Vitals reviewed.  Constitutional:      Appearance: Normal appearance. He is normal weight.  HENT:     Mouth/Throat:     Mouth: Mucous membranes are moist.     Pharynx: Oropharynx is clear.  Cardiovascular:     Rate and Rhythm: Normal rate and regular rhythm.     Pulses: Normal pulses.     Heart sounds: Normal heart sounds.  Pulmonary:     Effort: Pulmonary effort is normal.  Breath sounds: Normal breath sounds.  Abdominal:     General: Abdomen is flat.     Palpations: Abdomen is soft.  Musculoskeletal:        General: Normal range of motion.     Cervical back: Normal range of motion.  Skin:    General: Skin is warm.  Neurological:     General: No focal deficit present.     Mental Status: He is alert and oriented to person, place, and time. Mental status is at baseline.  Psychiatric:        Mood and Affect: Mood normal.        Behavior: Behavior normal.        Thought Content: Thought content normal.    Assessment/Plan: 1. Uncontrolled hypertension Discontinue bisoprolol-HCTZ due to low HR today. Will start losartan-HCTZ to further control BP management. Advised that we will need to follow-up in 1 month to assess response to therapy and that further medication may need to be added to reach BP control in order to have his dental procedure. Will also review echo results to assess for underlying cardiac disease. - losartan-hydrochlorothiazide (HYZAAR) 50-12.5 MG tablet; Take 1 tablet by mouth daily.  Dispense: 90 tablet; Refill: 3 - ECHOCARDIOGRAM COMPLETE; Future  2. Rectal bleeding Strongly advised to seek emergency care if he experiences a repeat  occurrence of rectal bleeding. Will refer to gastroenterology for evaluation due to his report of 1 week of rectal bleeding about 1 month prior. Reviewed his recent lab work, blood counts stable. - Ambulatory referral to Gastroenterology  3. Cigarette nicotine dependence without complication Will obtain baseline PFT, will need to consider CXR or low dose CT scan at next follow-up. - Pulmonary function test; Future Smoking cessation counseling: 1. Pt acknowledges the risks of long term smoking, she will try to quite smoking. 2. Options for different medications including nicotine products, chewing gum, patch etc, Wellbutrin and Chantix is discussed 3. Goal and date of compete cessation is discussed 4. Total time spent in smoking cessation is 15 min.  4. Non-adherence to medical therapy Due to his history of non-adherence to past therapy and cancellation of visits, discussed in depth the importance of him to adhere to all medications and advised strongly not to cancel his referral appointment to GI.  5. Flu vaccine need - Flu Vaccine MDCK QUAD PF  General Counseling: Tycen verbalizes understanding of the findings of todays visit and agrees with plan of treatment. I have discussed any further diagnostic evaluation that may be needed or ordered today. We also reviewed his medications today. he has been encouraged to call the office with any questions or concerns that should arise related to todays visit.    Orders Placed This Encounter  Procedures  . Flu Vaccine MDCK QUAD PF  . Basic metabolic panel  . Ambulatory referral to Gastroenterology  . ECHOCARDIOGRAM COMPLETE  . Pulmonary function test    Meds ordered this encounter  Medications  . losartan-hydrochlorothiazide (HYZAAR) 50-12.5 MG tablet    Sig: Take 1 tablet by mouth daily.    Dispense:  90 tablet    Refill:  3    Time spent:35 Minutes Time spent includes review of chart, medications, test results and follow-up plan with  the patient.  This patient was seen by Theodoro Grist AGNP-C in Collaboration with Dr Lavera Guise as a part of collaborative care agreement     Tanna Furry. Myia Bergh AGNP-C Internal medicine

## 2019-12-31 ENCOUNTER — Telehealth: Payer: Self-pay

## 2019-12-31 NOTE — Telephone Encounter (Signed)
Confirmed and screened for ov on 9/29 

## 2020-01-02 ENCOUNTER — Other Ambulatory Visit: Payer: Self-pay

## 2020-01-02 ENCOUNTER — Ambulatory Visit (INDEPENDENT_AMBULATORY_CARE_PROVIDER_SITE_OTHER): Payer: Medicare HMO | Admitting: Internal Medicine

## 2020-01-02 DIAGNOSIS — R0602 Shortness of breath: Secondary | ICD-10-CM

## 2020-01-02 LAB — PULMONARY FUNCTION TEST

## 2020-01-10 NOTE — Procedures (Signed)
[id number] [facility] [place], [address]  DATE OF SERVICE: January 02, 2020  Complete Pulmonary Function Testing Interpretation:  FINDINGS:  The forced vital capacity is mildly reduced.  FEV1-normal.  Postbronchodilator no significant improvement in FEV1.  FEV1 FVC ratio is normal.  Total lung capacity is moderately decreased.  Residual volume is decreased.  Residual until lung capacity ratio is decreased.  FRC is decreased.  DLCO is mildly decreased.  IMPRESSION:  This pulmonary function study would be consistent with moderate restrictive lung disease.  There was a mild reduction in DLCO which normalized corrected for alveolar volume  Allyne Gee, MD [facility] Pulmonary Critical Care Medicine Sleep Medicine

## 2020-01-22 DIAGNOSIS — E1136 Type 2 diabetes mellitus with diabetic cataract: Secondary | ICD-10-CM | POA: Diagnosis not present

## 2020-01-22 DIAGNOSIS — E119 Type 2 diabetes mellitus without complications: Secondary | ICD-10-CM | POA: Diagnosis not present

## 2020-01-25 ENCOUNTER — Other Ambulatory Visit: Payer: Self-pay

## 2020-01-25 ENCOUNTER — Ambulatory Visit: Payer: Medicare HMO

## 2020-01-25 DIAGNOSIS — I10 Essential (primary) hypertension: Secondary | ICD-10-CM

## 2020-01-25 DIAGNOSIS — R0602 Shortness of breath: Secondary | ICD-10-CM | POA: Diagnosis not present

## 2020-01-29 ENCOUNTER — Other Ambulatory Visit: Payer: Self-pay

## 2020-01-29 ENCOUNTER — Encounter: Payer: Self-pay | Admitting: Hospice and Palliative Medicine

## 2020-01-29 ENCOUNTER — Ambulatory Visit (INDEPENDENT_AMBULATORY_CARE_PROVIDER_SITE_OTHER): Payer: Medicare HMO | Admitting: Hospice and Palliative Medicine

## 2020-01-29 DIAGNOSIS — R300 Dysuria: Secondary | ICD-10-CM

## 2020-01-29 DIAGNOSIS — F17210 Nicotine dependence, cigarettes, uncomplicated: Secondary | ICD-10-CM

## 2020-01-29 DIAGNOSIS — Z0001 Encounter for general adult medical examination with abnormal findings: Secondary | ICD-10-CM | POA: Diagnosis not present

## 2020-01-29 DIAGNOSIS — K861 Other chronic pancreatitis: Secondary | ICD-10-CM | POA: Diagnosis not present

## 2020-01-29 DIAGNOSIS — I10 Essential (primary) hypertension: Secondary | ICD-10-CM | POA: Diagnosis not present

## 2020-01-29 DIAGNOSIS — E119 Type 2 diabetes mellitus without complications: Secondary | ICD-10-CM

## 2020-01-29 NOTE — Progress Notes (Signed)
[facility] [place], [place] [address]  Internal MEDICINE  Office Visit Note  Patient Name: Donald Scott  [id number]  [id number]  Date of Service: 01/29/2020  Chief Complaint  Patient presents with  . Medicare Wellness  . Hypertension  . Diabetes  . controlled substance policy    reviewed     HPI Pt is here for routine health maintenance examination Had eye exam two weeks ago for diabetic exam--changed glasses prescription and was told he has the beginning stages of cataracts Reviewed his PFT and echo results PFT-moderate restrictive lung disease--denies coughing, shortness of breath or wheezing, continues to "smoke" about 5-6 cigarettes per day, says he has never inhaled a cigarette Echo-normal LVEF, mild LVH, diastolic dysfunction, trace TR and MR--denies lower extremity edema, chest pain or palpitations  Routinely checks his glucose levels at home--averages 130-150's Continues to eat well, no issues with elimination No issues with sleeping--has had to adjust his sleep schedule him and his wife have recently become foster 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, he is woken up a few times during the night to care for the child  History of chronic pancreatitis--has significantly cut back on his alcohol intake, drinks 1-2 beers only a few nights per week, has been off all previously prescribed medications from his GI for may years, has not had a follow-up with GI since 2017--he is not interested in having any further follow-up Denies any further rectal bleeding since last visit He is also not interested in having a colonoscopy again  Reviewed recent labs--normal, slightly elevated glucose  Current Medication: Outpatient Encounter Medications as of 01/29/2020  Medication Sig Note  . albuterol (PROAIR HFA) 108 (90 Base) MCG/ACT inhaler Inhale 1 puff into the lungs every 6 (six) hours as needed for wheezing or shortness of breath.   . Alcohol Swabs PADS Use with  cleaning area to test blood sugar twice a day   . amLODipine (NORVASC) 5 MG tablet Take 2 tablets (10 mg total) by mouth daily.   . Blood Glucose Calibration (TRUE METRIX LEVEL 1) Low SOLN Use as directed to check blood sugar twice a day TRUE METRIX LEVEL 1 CTRL SOLN   . fluticasone (FLONASE) 50 MCG/ACT nasal spray SHAKE LQ AND U 1 TO 2 SPRAYS IEN D FOR RHINITIS 05/22/2015: Received from: External Pharmacy  . losartan-hydrochlorothiazide (HYZAAR) 50-12.5 MG tablet Take 1 tablet by mouth daily.   Marland Kitchen omeprazole (PRILOSEC) 40 MG capsule Take 1 capsule (40 mg total) by mouth daily.   . TRUEplus Lancets 30G MISC Use to test blood sugar twice a day    No facility-administered encounter medications on file as of 01/29/2020.    Surgical History: Past Surgical History:  Procedure Laterality Date  . CAROTID STENT  2010  . Vascular bypass graft      Medical History: Past Medical History:  Diagnosis Date  . Diabetes mellitus ([place]) 05/22/2015  . Gout 05/22/2015  . Hypertension     Family History: Family History  Problem Relation Age of Onset  . Arthritis Mother       Review of Systems  Constitutional: Negative for chills, fatigue and unexpected weight change.  HENT: Negative for congestion, postnasal drip, rhinorrhea, sneezing and sore throat.   Eyes: Negative for photophobia, redness and visual disturbance.  Respiratory: Negative for cough, chest tightness and shortness of breath.   Cardiovascular: Negative for chest pain and palpitations.  Gastrointestinal: Negative for abdominal pain, constipation, diarrhea, nausea and vomiting.  Genitourinary:  Negative for dysuria and frequency.  Musculoskeletal: Negative for arthralgias, back pain, joint swelling and neck pain.  Skin: Negative for rash.  Neurological: Negative for dizziness, tremors, light-headedness, numbness and headaches.  Hematological: Negative for adenopathy. Does not bruise/bleed easily.  Psychiatric/Behavioral: Negative for  behavioral problems (Depression), sleep disturbance and suicidal ideas. The patient is not nervous/anxious.      Vital Signs: BP 130/82   Pulse 82   Temp 97.9 F (36.6 C)   Ht 5' 7" (1.702 m)   Wt 157 lb (71.2 kg)   SpO2 97%   BMI 24.59 kg/m    Physical Exam Vitals reviewed.  Constitutional:      Appearance: Normal appearance. He is normal weight.  Cardiovascular:     Rate and Rhythm: Normal rate and regular rhythm.     Pulses: Normal pulses.     Heart sounds: Normal heart sounds.  Pulmonary:     Effort: Pulmonary effort is normal.     Breath sounds: Normal breath sounds.  Musculoskeletal:        General: Normal range of motion.  Skin:    General: Skin is warm.  Neurological:     General: No focal deficit present.     Mental Status: He is alert and oriented to person, place, and time. Mental status is at baseline.  Psychiatric:        Mood and Affect: Mood normal.        Behavior: Behavior normal.        Thought Content: Thought content normal.      LABS: Recent Results (from the past 2160 hour(s))  POCT HgB A1C     Status: Abnormal   Collection Time: 11/27/19  4:48 PM  Result Value Ref Range   Hemoglobin A1C 5.9 (A) 4.0 - 5.6 %   HbA1c POC (<> result, manual entry)     HbA1c, POC (prediabetic range)     HbA1c, POC (controlled diabetic range)    Comprehensive metabolic panel     Status: Abnormal   Collection Time: 12/17/19  9:10 AM  Result Value Ref Range   Glucose 143 (H) 65 - 99 mg/dL   BUN 16 8 - 27 mg/dL   Creatinine, Ser 1.10 0.76 - 1.27 mg/dL   GFR calc non Af Amer 67 >59 mL/min/1.73   GFR calc Af Amer 78 >59 mL/min/1.73    Comment: **[facility] currently reports eGFR in compliance with the current**   recommendations of the [employer]. [facility] will   update reporting as new guidelines are published from the NKF-ASN   Task force.    BUN/Creatinine Ratio 15 10 - 24   Sodium 136 134 - 144 mmol/L   Potassium 3.9 3.5 - 5.2 mmol/L    Chloride 100 96 - 106 mmol/L   CO2 25 20 - 29 mmol/L   Calcium 9.0 8.6 - 10.2 mg/dL   Total Protein 7.6 6.0 - 8.5 g/dL   Albumin 4.4 3.7 - 4.7 g/dL   Globulin, Total 3.2 1.5 - 4.5 g/dL   Albumin/Globulin Ratio 1.4 1.2 - 2.2   Bilirubin Total 0.6 0.0 - 1.2 mg/dL   Alkaline Phosphatase 94 44 - 121 IU/L    Comment:               **Please note reference interval change**   AST 18 0 - 40 IU/L   ALT 18 0 - 44 IU/L  CBC     Status: Abnormal   Collection Time: 12/17/19  9:10  AM  Result Value Ref Range   WBC 7.9 3.4 - 10.8 x10E3/uL   RBC 4.66 4.14 - 5.80 x10E6/uL   Hemoglobin 13.8 13.0 - 17.7 g/dL   Hematocrit 43.7 37.5 - 51.0 %   MCV 94 79 - 97 fL   MCH 29.6 26.6 - 33.0 pg   MCHC 31.6 31 - 35 g/dL   RDW 11.4 (L) 11.6 - 15.4 %   Platelets 159 150 - 450 x10E3/uL  Lipid Panel With LDL/HDL Ratio     Status: None   Collection Time: 12/17/19  9:10 AM  Result Value Ref Range   Cholesterol, Total 152 100 - 199 mg/dL   Triglycerides 89 0 - 149 mg/dL   HDL 63 >39 mg/dL   VLDL Cholesterol Cal 17 5 - 40 mg/dL   LDL Chol Calc (NIH) 72 0 - 99 mg/dL   LDL/HDL Ratio 1.1 0.0 - 3.6 ratio    Comment:                                     LDL/HDL Ratio                                             Men  Women                               1/2 Avg.Risk  1.0    1.5                                   Avg.Risk  3.6    3.2                                2X Avg.Risk  6.2    5.0                                3X Avg.Risk  8.0    6.1   T4, free     Status: None   Collection Time: 12/17/19  9:10 AM  Result Value Ref Range   Free T4 1.01 0.82 - 1.77 ng/dL  TSH     Status: None   Collection Time: 12/17/19  9:10 AM  Result Value Ref Range   TSH 1.900 0.450 - 4.500 uIU/mL  PSA     Status: None   Collection Time: 12/17/19  9:10 AM  Result Value Ref Range   Prostate Specific Ag, Serum 1.0 0.0 - 4.0 ng/mL    Comment: Roche ECLIA methodology. According to the American Urological Association, Serum PSA  should decrease and remain at undetectable levels after radical prostatectomy. The AUA defines biochemical recurrence as an initial PSA value 0.2 ng/mL or greater followed by a subsequent confirmatory PSA value 0.2 ng/mL or greater. Values obtained with different assay methods or kits cannot be used interchangeably. Results cannot be interpreted as absolute evidence of the presence or absence of malignant disease.   Hepatitis C antibody     Status: None   Collection Time: 12/17/19  9:10 AM  Result Value Ref Range  Hep C Virus Ab <0.1 0.0 - 0.9 s/co ratio    Comment:                                   Negative:     < 0.8                              Indeterminate: 0.8 - 0.9                                   Positive:     > 0.9  The CDC recommends that a positive HCV antibody result  be followed up with a HCV Nucleic Acid Amplification  test (283662).   Pulmonary function test     Status: None   Collection Time: 01/02/20  2:00 PM  Result Value Ref Range   FEV1     FVC     FEV1/FVC     TLC     DLCO      Assessment/Plan: 1. Encounter for routine adult health examination with abnormal findings Well appearing 69 year old [place] male Reviewed annual labs, normal No longer interested in having colonoscopy  2. Cigarette nicotine dependence without complication Again, continued to encourage to work on smoking cessation-he continues to not be interested and feels that it is not harmful as he continues to not inhale while smoking  3. Diet-controlled type 2 diabetes mellitus ([place]) Home glucose levels remain stable--will repeat A1C at next follow-up Discussed the need for vascular studies such as renal and carotid US--not interested at this time, mentions he may be willing to do further testing in the new year   4. Other chronic pancreatitis ([facility]) No symptoms or complications at this time, not interested in follow-up with GI  Not currently on any medication therapy-unable to tolerate Creon  in the past  5. Essential hypertension BP and HR well controlled today on current therapy, continue with routine monitoring  6. Dysuria - UA/M w/rflx Culture, Routine  General Counseling: Tiara verbalizes understanding of the findings of todays visit and agrees with plan of treatment. I have discussed any further diagnostic evaluation that may be needed or ordered today. We also reviewed his medications today. he has been encouraged to call the office with any questions or concerns that should arise related to todays visit.    Counseling: Smoking cessation counseling: 1. Pt acknowledges the risks of long term smoking, she will try to quite smoking. 2. Options for different medications including nicotine products, chewing gum, patch etc, Wellbutrin and Chantix is discussed 3. Goal and date of compete cessation is discussed 4. Total time spent in smoking cessation is 15 min.   Orders Placed This Encounter  Procedures  . UA/M w/rflx Culture, Routine      Total time spent:35 Minutes  Time spent includes review of chart, medications, test results, and follow up plan with the patient.   This patient was seen by Casey Burkitt AGNP-C Collaboration with Dr Lavera Guise as a part of collaborative care agreement   Tanna Furry. [facility] Internal Medicine

## 2020-01-30 LAB — UA/M W/RFLX CULTURE, ROUTINE
Bilirubin, UA: NEGATIVE
Ketones, UA: NEGATIVE
Leukocytes,UA: NEGATIVE
Nitrite, UA: NEGATIVE
Protein,UA: NEGATIVE
RBC, UA: NEGATIVE
Specific Gravity, UA: 1.011 (ref 1.005–1.030)
Urobilinogen, Ur: 0.2 mg/dL (ref 0.2–1.0)
pH, UA: 5 (ref 5.0–7.5)

## 2020-01-30 LAB — MICROSCOPIC EXAMINATION
Bacteria, UA: NONE SEEN
Casts: NONE SEEN /lpf
Epithelial Cells (non renal): NONE SEEN /hpf (ref 0–10)
RBC, Urine: NONE SEEN /hpf (ref 0–2)
WBC, UA: NONE SEEN /hpf (ref 0–5)

## 2020-03-05 ENCOUNTER — Ambulatory Visit (INDEPENDENT_AMBULATORY_CARE_PROVIDER_SITE_OTHER): Payer: Medicare HMO | Admitting: Gastroenterology

## 2020-03-05 VITALS — BP 188/113 | HR 86 | Temp 98.3°F | Ht 67.0 in | Wt 161.0 lb

## 2020-03-05 DIAGNOSIS — K625 Hemorrhage of anus and rectum: Secondary | ICD-10-CM

## 2020-03-05 NOTE — Progress Notes (Signed)
Jonathon Bellows MD, MRCP(U.K) [address]  [place]  [place], [place] [address]  Main: [phone]  Fax: [phone]   Gastroenterology Consultation  Referring Provider:     Ronnell Freshwater, NP Primary Care Physician:  Ronnell Freshwater, NP Primary Gastroenterologist:  Dr. Jonathon Bellows  Reason for Consultation:   Rectal bleeding        HPI:   Donald Scott is a 69 y.o. y/o male referred for consultation & management  by Ronnell Freshwater, NP.    He was previously seen and evaluated back in 2018 by Dr. Vira Agar.  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  Has been treated for extrapancreatic insufficiency.  MRI back in 2018 demonstrated severe chronic calcific pancreatitis with extensive duct dilation and calcification within the dorsal pancreatic duct.   mild narrowing in the CBD in the vicinity of the pancreatic head but no biliary dilation. 12/17/2019: Hemoglobin 13.8 g.  Platelet count 159.  CMP demonstrated glucose of 143 creatinine of 1.1 and normal transaminases and total bilirubin.  TSH normal at 1.9.  Hepatitis C virus antibody negative.  Past Medical History:  Diagnosis Date  . Diabetes mellitus ([place]) 05/22/2015  . Gout 05/22/2015  . Hypertension     Past Surgical History:  Procedure Laterality Date  . CAROTID STENT  2010  . Vascular bypass graft      Prior to Admission medications   Medication Sig Start Date End Date Taking? Authorizing Provider  albuterol (PROAIR HFA) 108 (90 Base) MCG/ACT inhaler Inhale 1 puff into the lungs every 6 (six) hours as needed for wheezing or shortness of breath.    [provider]  Alcohol Swabs PADS Use with cleaning area to test blood sugar twice a day 11/27/19   Ronnell Freshwater, NP  amLODipine (NORVASC) 5 MG tablet Take 2 tablets (10 mg total) by mouth daily. 11/27/19   Ronnell Freshwater, NP  Blood Glucose Calibration (TRUE METRIX LEVEL 1) Low SOLN Use as directed to check blood sugar twice a day TRUE  METRIX LEVEL 1 CTRL SOLN 11/27/19   Boscia, Heather E, NP  fluticasone (FLONASE) 50 MCG/ACT nasal spray SHAKE LQ AND U 1 TO 2 SPRAYS IEN D FOR RHINITIS 04/01/15   [provider]  losartan-hydrochlorothiazide (HYZAAR) 50-12.5 MG tablet Take 1 tablet by mouth daily. 12/18/19   Luiz Ochoa, NP  omeprazole (PRILOSEC) 40 MG capsule Take 1 capsule (40 mg total) by mouth daily. 12/03/19   Ronnell Freshwater, NP  TRUEplus Lancets 30G MISC Use to test blood sugar twice a day 11/27/19   Ronnell Freshwater, NP    Family History  Problem Relation Age of Onset  . Arthritis Mother      Social History   Tobacco Use  . Smoking status: Current Every Day Smoker    Packs/day: 1.00    Types: Cigarettes  . Smokeless tobacco: Never Used  Substance Use Topics  . Alcohol use: Yes    Alcohol/week: 18.0 standard drinks    Types: 18 Cans of beer per week  . Drug use: No    Allergies as of 03/05/2020 - Review Complete 01/29/2020  Allergen Reaction Noted  . Iodinated diagnostic agents Other (See Comments) 05/22/2015  . Other Hives, Other (See Comments), and Rash 05/22/2015  . Penicillin g Other (See Comments) 05/22/2015  . Penicillins  05/21/2015    Review of Systems:    All systems reviewed and negative except where noted in HPI.  Physical Exam:  There were no vitals taken for this visit. No LMP for male patient. Psych:  Alert and cooperative. Normal mood and affect. General:   Alert,  Well-developed, well-nourished, pleasant and cooperative in NAD Head:  Normocephalic and atraumatic. Eyes:  Sclera clear, no icterus.   Conjunctiva pink. Ears:  Normal auditory acuity. Lungs:  Respirations even and unlabored.  Clear throughout to auscultation.   No wheezes, crackles, or rhonchi. No acute distress. Heart:  Regular rate and rhythm; no murmurs, clicks, rubs, or gallops. Abdomen:  Normal bowel sounds.  No bruits.  Soft, non-tender and non-distended without masses, hepatosplenomegaly or hernias  noted.  No guarding or rebound tenderness.    Neurologic:  Alert and oriented x3;  grossly normal neurologically. Psych:  Alert and cooperative. Normal mood and affect.  Imaging Studies: No results found.  Assessment and Plan:   Donald Scott is a 69 y.o. y/o male has been referred for rectal bleeding.  Plan 1.  Colonoscopy  I have discussed alternative options, risks & benefits,  which include, but are not limited to, bleeding, infection, perforation,respiratory complication & drug reaction.  The patient agrees with this plan & written consent will be obtained.     Follow up inas needed  Dr Jonathon Bellows MD,MRCP(U.K)

## 2020-04-15 ENCOUNTER — Other Ambulatory Visit: Payer: Self-pay

## 2020-04-15 ENCOUNTER — Ambulatory Visit (INDEPENDENT_AMBULATORY_CARE_PROVIDER_SITE_OTHER): Payer: Medicare HMO | Admitting: Hospice and Palliative Medicine

## 2020-04-15 ENCOUNTER — Encounter: Payer: Self-pay | Admitting: Hospice and Palliative Medicine

## 2020-04-15 VITALS — BP 170/90 | HR 88 | Temp 97.8°F | Resp 16 | Ht 67.0 in | Wt 162.0 lb

## 2020-04-15 DIAGNOSIS — J069 Acute upper respiratory infection, unspecified: Secondary | ICD-10-CM

## 2020-04-15 DIAGNOSIS — E119 Type 2 diabetes mellitus without complications: Secondary | ICD-10-CM | POA: Diagnosis not present

## 2020-04-15 DIAGNOSIS — M109 Gout, unspecified: Secondary | ICD-10-CM | POA: Diagnosis not present

## 2020-04-15 DIAGNOSIS — I10 Essential (primary) hypertension: Secondary | ICD-10-CM

## 2020-04-15 LAB — POCT GLYCOSYLATED HEMOGLOBIN (HGB A1C): Hemoglobin A1C: 5.9 % — AB (ref 4.0–5.6)

## 2020-04-15 MED ORDER — BENZONATATE 100 MG PO CAPS: 100.0000 mg | ORAL_CAPSULE | Freq: Two times a day (BID) | ORAL | 0 refills | Status: DC | PRN

## 2020-04-15 MED ORDER — AZITHROMYCIN 250 MG PO TABS: ORAL_TABLET | ORAL | 0 refills | Status: DC

## 2020-04-15 MED ORDER — ALLOPURINOL 100 MG PO TABS: 100.0000 mg | ORAL_TABLET | Freq: Every day | ORAL | 1 refills | Status: DC

## 2020-04-15 NOTE — Progress Notes (Signed)
[facility] [place], [place] [address]  Internal MEDICINE  Office Visit Note  Patient Name: Donald Scott  [id number]  [id number]  Date of Service: 04/15/2020  Chief Complaint  Patient presents with  . Diabetes  . Hypertension    HPI Patient is here for routine follow-up He has been doing well Few complaints today: Complaining of "gout" pain in his right foot--pain started about a week ago and has gotten better, he has been diagnosed with gout in the past and was on allopurinol but his refills ran out and he never pursued this further, this was a few years ago and he has not had a flare until now C/o chest congestion, coughing and nasal congestion--symptoms have been ongoing for over a week, has been taking OTC medications without much relief Elevated BP--He has been taking Mucinex DM twice a day for the last week, he has noticed since taking this he has been having a headache he describes as a blood pressure headache DM-continues to monitor glucose levels at home, readings have been 90-150  Continues to smoke about a pack of cigarettes per day and drinks a few drinks almost nightly  Current Medication: Outpatient Encounter Medications as of 04/15/2020  Medication Sig Note  . albuterol (VENTOLIN HFA) 108 (90 Base) MCG/ACT inhaler Inhale 1 puff into the lungs every 6 (six) hours as needed for wheezing or shortness of breath.   . Alcohol Swabs PADS Use with cleaning area to test blood sugar twice a day   . allopurinol (ZYLOPRIM) 100 MG tablet Take 1 tablet (100 mg total) by mouth daily.   Marland Kitchen amLODipine (NORVASC) 5 MG tablet Take 2 tablets (10 mg total) by mouth daily.   Marland Kitchen azithromycin (ZITHROMAX) 250 MG tablet Take one tablet by mouth daily for 14 days.   . benzonatate (TESSALON) 100 MG capsule Take 1 capsule (100 mg total) by mouth 2 (two) times daily as needed for cough.   . Blood Glucose Calibration (TRUE METRIX LEVEL 1) Low SOLN Use as directed to check  blood sugar twice a day TRUE METRIX LEVEL 1 CTRL SOLN   . fluticasone (FLONASE) 50 MCG/ACT nasal spray SHAKE LQ AND U 1 TO 2 SPRAYS IEN D FOR RHINITIS 05/22/2015: Received from: External Pharmacy  . losartan-hydrochlorothiazide (HYZAAR) 50-12.5 MG tablet Take 1 tablet by mouth daily.   Marland Kitchen omeprazole (PRILOSEC) 40 MG capsule Take 1 capsule (40 mg total) by mouth daily.   . TRUEplus Lancets 30G MISC Use to test blood sugar twice a day    No facility-administered encounter medications on file as of 04/15/2020.    Surgical History: Past Surgical History:  Procedure Laterality Date  . CAROTID STENT  2010  . Vascular bypass graft      Medical History: Past Medical History:  Diagnosis Date  . Diabetes mellitus ([place]) 05/22/2015  . Gout 05/22/2015  . Hypertension     Family History: Family History  Problem Relation Age of Onset  . Arthritis Mother     Social History   Socioeconomic History  . Marital status: Married    Spouse name: Not on file  . Number of children: Not on file  . Years of education: Not on file  . Highest education level: Not on file  Occupational History  . Not on file  Tobacco Use  . Smoking status: Current Every Day Smoker    Packs/day: 1.00    Types: Cigarettes  . Smokeless tobacco: Never Used  Substance and  Sexual Activity  . Alcohol use: Yes    Alcohol/week: 18.0 standard drinks    Types: 18 Cans of beer per week  . Drug use: No  . Sexual activity: Not on file  Other Topics Concern  . Not on file  Social History Narrative  . Not on file   Social Determinants of Health   Financial Resource Strain: Not on file  Food Insecurity: Not on file  Transportation Needs: Not on file  Physical Activity: Not on file  Stress: Not on file  Social Connections: Not on file  Intimate Partner Violence: Not on file   Review of Systems  Constitutional: Negative for chills, fatigue and unexpected weight change.  HENT: Positive for congestion. Negative for  postnasal drip, rhinorrhea, sneezing and sore throat.   Eyes: Negative for redness.  Respiratory: Positive for cough and wheezing. Negative for chest tightness and shortness of breath.   Cardiovascular: Negative for chest pain and palpitations.  Gastrointestinal: Negative for abdominal pain, constipation, diarrhea, nausea and vomiting.  Genitourinary: Negative for dysuria and frequency.  Musculoskeletal: Negative for arthralgias, back pain, joint swelling and neck pain.       Pain to right top of foot  Skin: Negative for rash.  Neurological: Negative for dizziness, tremors, light-headedness, numbness and headaches.  Hematological: Negative for adenopathy. Does not bruise/bleed easily.  Psychiatric/Behavioral: Negative for behavioral problems (Depression), sleep disturbance and suicidal ideas. The patient is not nervous/anxious.     Vital Signs: BP (!) 170/90   Pulse 88   Temp 97.8 F (36.6 C)   Resp 16   Ht 5\' 7"  (1.702 m)   Wt 162 lb (73.5 kg)   SpO2 98%   BMI 25.37 kg/m    Physical Exam Vitals reviewed.  Constitutional:      Appearance: Normal appearance. He is obese.  Cardiovascular:     Rate and Rhythm: Normal rate and regular rhythm.     Pulses: Normal pulses.     Heart sounds: Normal heart sounds.  Pulmonary:     Effort: Pulmonary effort is normal.     Breath sounds: Examination of the right-lower field reveals wheezing. Examination of the left-lower field reveals wheezing. Decreased breath sounds and wheezing present.  Abdominal:     General: Abdomen is flat.     Palpations: Abdomen is soft.  Musculoskeletal:        General: Normal range of motion.     Cervical back: Normal range of motion.  Skin:    General: Skin is warm.  Neurological:     General: No focal deficit present.     Mental Status: He is alert and oriented to person, place, and time. Mental status is at baseline.  Psychiatric:        Mood and Affect: Mood normal.        Behavior: Behavior normal.         Thought Content: Thought content normal.        Judgment: Judgment normal.    Assessment/Plan: 1. Diet-controlled type 2 diabetes mellitus (HCC) A1C 5.9 today, continues to be well controlled with diet and lifestyle Advised to work on cutting back his alcohol intake as this will also help keep his A1C levels stable - POCT HgB A1C  2. Essential hypertension BP elevated today--advised to stop Mucinex DM as this could elevating his BP Sample of regular Mucinex given in office today Follow-up in 2 weeks to monitor BP//will adjust therapy if remains elevated  3. Acute gout of right  foot, unspecified cause Pain significantly improved since initial attack last week, restart allopurinol to prevent further acute flares Explained alcohol intake also increases his risk of acute gout flares - allopurinol (ZYLOPRIM) 100 MG tablet; Take 1 tablet (100 mg total) by mouth daily.  Dispense: 90 tablet; Refill: 1  4. Upper respiratory infection with cough and congestion Extended course of azithromycin and Tessalon for symptoms relief Consider chest CT for follow-up//PFT in September--moderate restrictive lung disease - azithromycin (ZITHROMAX) 250 MG tablet; Take one tablet by mouth daily for 14 days.  Dispense: 14 tablet; Refill: 0 - benzonatate (TESSALON) 100 MG capsule; Take 1 capsule (100 mg total) by mouth 2 (two) times daily as needed for cough.  Dispense: 30 capsule; Refill: 0  General Counseling: Jemari verbalizes understanding of the findings of todays visit and agrees with plan of treatment. I have discussed any further diagnostic evaluation that may be needed or ordered today. We also reviewed his medications today. he has been encouraged to call the office with any questions or concerns that should arise related to todays visit.    Orders Placed This Encounter  Procedures  . POCT HgB A1C    Meds ordered this encounter  Medications  . allopurinol (ZYLOPRIM) 100 MG tablet    Sig:  Take 1 tablet (100 mg total) by mouth daily.    Dispense:  90 tablet    Refill:  1  . azithromycin (ZITHROMAX) 250 MG tablet    Sig: Take one tablet by mouth daily for 14 days.    Dispense:  14 tablet    Refill:  0  . benzonatate (TESSALON) 100 MG capsule    Sig: Take 1 capsule (100 mg total) by mouth 2 (two) times daily as needed for cough.    Dispense:  30 capsule    Refill:  0    Time spent: 30 Minutes Time spent includes review of chart, medications, test results and follow-up plan with the patient.  This patient was seen by Theodoro Grist AGNP-C in Collaboration with Dr Lavera Guise as a part of collaborative care agreement     Tanna Furry. Hagen Tidd AGNP-C Internal medicine

## 2020-04-25 ENCOUNTER — Telehealth: Payer: Self-pay

## 2020-04-25 NOTE — Telephone Encounter (Signed)
Faxed Cologuard 

## 2020-04-30 ENCOUNTER — Other Ambulatory Visit: Payer: Self-pay

## 2020-05-02 ENCOUNTER — Encounter: Payer: Self-pay | Admitting: Hospice and Palliative Medicine

## 2020-05-02 ENCOUNTER — Ambulatory Visit (INDEPENDENT_AMBULATORY_CARE_PROVIDER_SITE_OTHER): Payer: Medicare HMO | Admitting: Hospice and Palliative Medicine

## 2020-05-02 VITALS — BP 197/97 | HR 77 | Temp 97.6°F | Resp 16 | Ht 67.0 in | Wt 163.0 lb

## 2020-05-02 DIAGNOSIS — F17210 Nicotine dependence, cigarettes, uncomplicated: Secondary | ICD-10-CM

## 2020-05-02 DIAGNOSIS — M1A00X0 Idiopathic chronic gout, unspecified site, without tophus (tophi): Secondary | ICD-10-CM

## 2020-05-02 DIAGNOSIS — I10 Essential (primary) hypertension: Secondary | ICD-10-CM | POA: Diagnosis not present

## 2020-05-02 MED ORDER — LOSARTAN POTASSIUM-HCTZ 100-25 MG PO TABS: 1.0000 | ORAL_TABLET | Freq: Every day | ORAL | 3 refills | Status: DC

## 2020-05-02 MED ORDER — AMLODIPINE BESYLATE 10 MG PO TABS: 10.0000 mg | ORAL_TABLET | Freq: Every day | ORAL | 3 refills | Status: DC

## 2020-05-02 NOTE — Progress Notes (Signed)
[facility] [place], [place] [address]  Internal MEDICINE  Office Visit Note  Patient Name: Donald Scott  [id number]  [id number]  Date of Service: 05/04/2020  Chief Complaint  Patient presents with  . Follow-up  . Diabetes  . Hypertension    HPI Patient is here for routine follow-up Gout pain has resolved Chest congestion/sinusitis symptoms resolved  BP remains elevated--he has been taking 10 mg amlodipine Denies headaches or visual disturbances Wanting to get his BP better controlled in order to be able to have dental procedures and get dentures  He has not been sleeping well--he and his wife have been fostering a toddler boy that is up and down throughout the night  Current Medication: Outpatient Encounter Medications as of 05/02/2020  Medication Sig Note  . amLODipine (NORVASC) 10 MG tablet Take 1 tablet (10 mg total) by mouth daily.   Marland Kitchen losartan-hydrochlorothiazide (HYZAAR) 100-25 MG tablet Take 1 tablet by mouth daily.   Marland Kitchen albuterol (VENTOLIN HFA) 108 (90 Base) MCG/ACT inhaler Inhale 1 puff into the lungs every 6 (six) hours as needed for wheezing or shortness of breath.   . Alcohol Swabs PADS Use with cleaning area to test blood sugar twice a day   . allopurinol (ZYLOPRIM) 100 MG tablet Take 1 tablet (100 mg total) by mouth daily.   . Blood Glucose Calibration (TRUE METRIX LEVEL 1) Low SOLN Use as directed to check blood sugar twice a day TRUE METRIX LEVEL 1 CTRL SOLN   . fluticasone (FLONASE) 50 MCG/ACT nasal spray SHAKE LQ AND U 1 TO 2 SPRAYS IEN D FOR RHINITIS 05/22/2015: Received from: External Pharmacy  . omeprazole (PRILOSEC) 40 MG capsule Take 1 capsule (40 mg total) by mouth daily.   . TRUEplus Lancets 30G MISC Use to test blood sugar twice a day   . [DISCONTINUED] amLODipine (NORVASC) 5 MG tablet Take 2 tablets (10 mg total) by mouth daily.   . [DISCONTINUED] azithromycin (ZITHROMAX) 250 MG tablet Take one tablet by mouth daily for  14 days.   . [DISCONTINUED] benzonatate (TESSALON) 100 MG capsule Take 1 capsule (100 mg total) by mouth 2 (two) times daily as needed for cough.   . [DISCONTINUED] losartan-hydrochlorothiazide (HYZAAR) 50-12.5 MG tablet Take 1 tablet by mouth daily.    No facility-administered encounter medications on file as of 05/02/2020.    Surgical History: Past Surgical History:  Procedure Laterality Date  . CAROTID STENT  2010  . Vascular bypass graft      Medical History: Past Medical History:  Diagnosis Date  . Diabetes mellitus ([place]) 05/22/2015  . Gout 05/22/2015  . Hypertension     Family History: Family History  Problem Relation Age of Onset  . Arthritis Mother     Social History   Socioeconomic History  . Marital status: Married    Spouse name: Not on file  . Number of children: Not on file  . Years of education: Not on file  . Highest education level: Not on file  Occupational History  . Not on file  Tobacco Use  . Smoking status: Current Every Day Smoker    Packs/day: 1.00    Types: Cigarettes  . Smokeless tobacco: Never Used  Substance and Sexual Activity  . Alcohol use: Yes    Alcohol/week: 18.0 standard drinks    Types: 18 Cans of beer per week  . Drug use: No  . Sexual activity: Not on file  Other Topics Concern  . Not on  file  Social History Narrative  . Not on file   Social Determinants of Health   Financial Resource Strain: Not on file  Food Insecurity: Not on file  Transportation Needs: Not on file  Physical Activity: Not on file  Stress: Not on file  Social Connections: Not on file  Intimate Partner Violence: Not on file      Review of Systems  Constitutional: Negative for chills, fatigue and unexpected weight change.  HENT: Negative for congestion, postnasal drip, rhinorrhea, sneezing and sore throat.   Eyes: Negative for redness.  Respiratory: Negative for cough, chest tightness and shortness of breath.   Cardiovascular: Negative for chest  pain and palpitations.  Gastrointestinal: Negative for abdominal pain, constipation, diarrhea, nausea and vomiting.  Genitourinary: Negative for dysuria and frequency.  Musculoskeletal: Negative for arthralgias, back pain, joint swelling and neck pain.  Skin: Negative for rash.  Neurological: Negative for tremors and numbness.  Hematological: Negative for adenopathy. Does not bruise/bleed easily.  Psychiatric/Behavioral: Positive for sleep disturbance. Negative for behavioral problems (Depression) and suicidal ideas. The patient is not nervous/anxious.     Vital Signs: BP (!) 197/97   Pulse 77   Temp 97.6 F (36.4 C)   Resp 16   Ht 5\' 7"  (1.702 m)   Wt 163 lb (73.9 kg)   SpO2 100%   BMI 25.53 kg/m    Physical Exam Vitals reviewed.  Constitutional:      Appearance: Normal appearance. He is normal weight.  Cardiovascular:     Rate and Rhythm: Normal rate and regular rhythm.     Pulses: Normal pulses.     Heart sounds: Normal heart sounds.  Pulmonary:     Effort: Pulmonary effort is normal.     Breath sounds: Normal breath sounds.  Abdominal:     General: Abdomen is flat.  Musculoskeletal:        General: Normal range of motion.     Cervical back: Normal range of motion.  Skin:    General: Skin is warm.  Neurological:     General: No focal deficit present.     Mental Status: He is alert and oriented to person, place, and time. Mental status is at baseline.  Psychiatric:        Mood and Affect: Mood normal.        Behavior: Behavior normal.        Thought Content: Thought content normal.        Judgment: Judgment normal.    Assessment/Plan: 1. Uncontrolled hypertension Increase dose of Hyzaar, continue with 10 mg amlodipine Follow-up in 2 weeks--adjust therapy as needed, may benefit from hydralazine - losartan-hydrochlorothiazide (HYZAAR) 100-25 MG tablet; Take 1 tablet by mouth daily.  Dispense: 90 tablet; Refill: 3 - amLODipine (NORVASC) 10 MG tablet; Take 1  tablet (10 mg total) by mouth daily.  Dispense: 90 tablet; Refill: 3  2. Cigarette nicotine dependence without complication Smoking cessation counseling: 1. Pt acknowledges the risks of long term smoking, she will try to quite smoking. 2. Options for different medications including nicotine products, chewing gum, patch etc, Wellbutrin and Chantix is discussed 3. Goal and date of compete cessation is discussed 4. Total time spent in smoking cessation is 15 min.   3. Idiopathic chronic gout without tophus, unspecified site Acute flare resolved, continue with allopurinol  General Counseling: Anguel verbalizes understanding of the findings of todays visit and agrees with plan of treatment. I have discussed any further diagnostic evaluation that may be needed or  ordered today. We also reviewed his medications today. he has been encouraged to call the office with any questions or concerns that should arise related to todays visit.  Meds ordered this encounter  Medications  . losartan-hydrochlorothiazide (HYZAAR) 100-25 MG tablet    Sig: Take 1 tablet by mouth daily.    Dispense:  90 tablet    Refill:  3  . amLODipine (NORVASC) 10 MG tablet    Sig: Take 1 tablet (10 mg total) by mouth daily.    Dispense:  90 tablet    Refill:  3    Time spent: 30 Minutes Time spent includes review of chart, medications, test results and follow-up plan with the patient.  This patient was seen by Theodoro Grist AGNP-C in Collaboration with Dr Lavera Guise as a part of collaborative care agreement     Tanna Furry. Neenah Canter AGNP-C Internal medicine

## 2020-05-04 ENCOUNTER — Encounter: Payer: Self-pay | Admitting: Hospice and Palliative Medicine

## 2020-05-08 DIAGNOSIS — Z1212 Encounter for screening for malignant neoplasm of rectum: Secondary | ICD-10-CM | POA: Diagnosis not present

## 2020-05-08 DIAGNOSIS — Z1211 Encounter for screening for malignant neoplasm of colon: Secondary | ICD-10-CM | POA: Diagnosis not present

## 2020-05-16 ENCOUNTER — Encounter: Payer: Self-pay | Admitting: Hospice and Palliative Medicine

## 2020-05-16 ENCOUNTER — Other Ambulatory Visit: Payer: Self-pay

## 2020-05-16 ENCOUNTER — Ambulatory Visit (INDEPENDENT_AMBULATORY_CARE_PROVIDER_SITE_OTHER): Payer: Medicare HMO | Admitting: Hospice and Palliative Medicine

## 2020-05-16 VITALS — BP 138/86 | HR 60 | Temp 97.5°F | Resp 16 | Ht 67.0 in | Wt 159.0 lb

## 2020-05-16 DIAGNOSIS — E119 Type 2 diabetes mellitus without complications: Secondary | ICD-10-CM | POA: Diagnosis not present

## 2020-05-16 DIAGNOSIS — F17210 Nicotine dependence, cigarettes, uncomplicated: Secondary | ICD-10-CM | POA: Diagnosis not present

## 2020-05-16 DIAGNOSIS — I10 Essential (primary) hypertension: Secondary | ICD-10-CM

## 2020-05-16 LAB — COLOGUARD: COLOGUARD: NEGATIVE

## 2020-05-16 LAB — EXTERNAL GENERIC LAB PROCEDURE: COLOGUARD: NEGATIVE

## 2020-05-16 NOTE — Progress Notes (Signed)
[facility] [place], [place] [address]  Internal MEDICINE  Office Visit Note  Patient Name: Donald Scott  [id number]  [id number]  Date of Service: 05/24/2020  Chief Complaint  Patient presents with  . Follow-up  . Diabetes  . Hypertension    HPI Patient is here for routine follow-up Close follow-up for blood pressure monitoring and management BP reading today and home readings have been better controlled--currently taking amlodipine as well as Hyzaar Reports he has been feeling better Sleep has somewhat improved, able to fall asleep easier after waking up in the night with toddler  Current Medication: Outpatient Encounter Medications as of 05/16/2020  Medication Sig Note  . albuterol (VENTOLIN HFA) 108 (90 Base) MCG/ACT inhaler Inhale 1 puff into the lungs every 6 (six) hours as needed for wheezing or shortness of breath.   . Alcohol Swabs PADS Use with cleaning area to test blood sugar twice a day   . allopurinol (ZYLOPRIM) 100 MG tablet Take 1 tablet (100 mg total) by mouth daily.   Marland Kitchen amLODipine (NORVASC) 10 MG tablet Take 1 tablet (10 mg total) by mouth daily.   . Blood Glucose Calibration (TRUE METRIX LEVEL 1) Low SOLN Use as directed to check blood sugar twice a day TRUE METRIX LEVEL 1 CTRL SOLN   . fluticasone (FLONASE) 50 MCG/ACT nasal spray SHAKE LQ AND U 1 TO 2 SPRAYS IEN D FOR RHINITIS 05/22/2015: Received from: External Pharmacy  . losartan-hydrochlorothiazide (HYZAAR) 100-25 MG tablet Take 1 tablet by mouth daily.   Marland Kitchen omeprazole (PRILOSEC) 40 MG capsule Take 1 capsule (40 mg total) by mouth daily.   . TRUEplus Lancets 30G MISC Use to test blood sugar twice a day    No facility-administered encounter medications on file as of 05/16/2020.    Surgical History: Past Surgical History:  Procedure Laterality Date  . CAROTID STENT  2010  . Vascular bypass graft      Medical History: Past Medical History:  Diagnosis Date  . Diabetes  mellitus ([place]) 05/22/2015  . Gout 05/22/2015  . Hypertension     Family History: Family History  Problem Relation Age of Onset  . Arthritis Mother     Social History   Socioeconomic History  . Marital status: Married    Spouse name: Not on file  . Number of children: Not on file  . Years of education: Not on file  . Highest education level: Not on file  Occupational History  . Not on file  Tobacco Use  . Smoking status: Current Every Day Smoker    Packs/day: 1.00    Types: Cigarettes  . Smokeless tobacco: Never Used  Substance and Sexual Activity  . Alcohol use: Yes    Alcohol/week: 18.0 standard drinks    Types: 18 Cans of beer per week  . Drug use: No  . Sexual activity: Not on file  Other Topics Concern  . Not on file  Social History Narrative  . Not on file   Social Determinants of Health   Financial Resource Strain: Not on file  Food Insecurity: Not on file  Transportation Needs: Not on file  Physical Activity: Not on file  Stress: Not on file  Social Connections: Not on file  Intimate Partner Violence: Not on file      Review of Systems  Constitutional: Negative for chills, fatigue and unexpected weight change.  HENT: Negative for congestion, postnasal drip, rhinorrhea, sneezing and sore throat.   Eyes: Negative for  redness.  Respiratory: Negative for cough, chest tightness and shortness of breath.   Cardiovascular: Negative for chest pain and palpitations.  Gastrointestinal: Negative for abdominal pain, constipation, diarrhea, nausea and vomiting.  Genitourinary: Negative for dysuria and frequency.  Musculoskeletal: Negative for arthralgias, back pain, joint swelling and neck pain.  Skin: Negative for rash.  Neurological: Negative for tremors and numbness.  Hematological: Negative for adenopathy. Does not bruise/bleed easily.  Psychiatric/Behavioral: Negative for behavioral problems (Depression), sleep disturbance and suicidal ideas. The patient is  not nervous/anxious.     Vital Signs: BP 138/86   Pulse 60   Temp (!) 97.5 F (36.4 C)   Resp 16   Ht 5\' 7"  (1.702 m)   Wt 159 lb (72.1 kg)   SpO2 99%   BMI 24.90 kg/m    Physical Exam Vitals reviewed.  Constitutional:      Appearance: Normal appearance. He is normal weight.  Cardiovascular:     Rate and Rhythm: Normal rate and regular rhythm.     Pulses: Normal pulses.     Heart sounds: Normal heart sounds.  Pulmonary:     Effort: Pulmonary effort is normal.     Breath sounds: Normal breath sounds.  Abdominal:     General: Abdomen is flat.     Palpations: Abdomen is soft.  Musculoskeletal:        General: Normal range of motion.     Cervical back: Normal range of motion.  Skin:    General: Skin is warm.  Neurological:     General: No focal deficit present.     Mental Status: He is alert and oriented to person, place, and time. Mental status is at baseline.  Psychiatric:        Mood and Affect: Mood normal.        Behavior: Behavior normal.        Thought Content: Thought content normal.        Judgment: Judgment normal.    Assessment/Plan: 1. Essential hypertension BP and HR well controlled today--continue with current therapy and close montioring  2. Cigarette nicotine dependence without complication Discussed low dose CT screening for lung cancer--he would like to hold off at this time Smoking cessation counseling: 1. Pt acknowledges the risks of long term smoking, she will try to quite smoking. 2. Options for different medications including nicotine products, chewing gum, patch etc, Wellbutrin and Chantix is discussed 3. Goal and date of compete cessation is discussed 4. Total time spent in smoking cessation is 15 min.   3. Diet-controlled type 2 diabetes mellitus ([place]) Will recheck A1C at next visit--continue with healthy eating habits and limiting alcohol consumption  General Counseling: Jaris verbalizes understanding of the findings of todays visit  and agrees with plan of treatment. I have discussed any further diagnostic evaluation that may be needed or ordered today. We also reviewed his medications today. he has been encouraged to call the office with any questions or concerns that should arise related to todays visit.   Time spent: 30 Minutes Time spent includes review of chart, medications, test results and follow-up plan with the patient.  This patient was seen by Theodoro Grist AGNP-C in Collaboration with Dr Lavera Guise as a part of collaborative care agreement     Tanna Furry. Darien Kading AGNP-C Internal medicine

## 2020-05-19 ENCOUNTER — Telehealth: Payer: Self-pay

## 2020-05-19 NOTE — Telephone Encounter (Signed)
PT advised cologaurd is negative

## 2020-05-24 ENCOUNTER — Encounter: Payer: Self-pay | Admitting: Hospice and Palliative Medicine

## 2020-06-26 ENCOUNTER — Other Ambulatory Visit: Payer: Self-pay

## 2020-06-26 ENCOUNTER — Telehealth: Payer: Self-pay

## 2020-06-26 ENCOUNTER — Other Ambulatory Visit: Payer: Self-pay | Admitting: Hospice and Palliative Medicine

## 2020-06-26 DIAGNOSIS — M109 Gout, unspecified: Secondary | ICD-10-CM

## 2020-06-26 NOTE — Telephone Encounter (Signed)
Pt called he refill on Zenpep refills as per taylor advised to call his GI Dr  For refill due to they pres him that med

## 2020-06-27 ENCOUNTER — Telehealth: Payer: Self-pay | Admitting: Hospice and Palliative Medicine

## 2020-06-27 NOTE — Progress Notes (Signed)
  Chronic Care Management   Outreach Note  06/27/2020 Name: JJ ENYEART MRN: [id number] DOB: Apr 11, 1950  Referred by: Luiz Ochoa, NP Reason for referral : No chief complaint on file.   An unsuccessful telephone outreach was attempted today. The patient was referred to the pharmacist for assistance with care management and care coordination.   Follow Up Plan:   Carley Perdue [employer] Scheduler

## 2020-07-02 ENCOUNTER — Telehealth: Payer: Self-pay | Admitting: Hospice and Palliative Medicine

## 2020-07-02 NOTE — Progress Notes (Signed)
  Chronic Care Management   Outreach Note  07/02/2020 Name: LYONEL MOREJON MRN: [id number] DOB: 1950/07/05  Referred by: Luiz Ochoa, NP Reason for referral : No chief complaint on file.   A second unsuccessful telephone outreach was attempted today. The patient was referred to pharmacist for assistance with care management and care coordination.  Follow Up Plan:   Carley Perdue [employer] Scheduler

## 2020-07-14 ENCOUNTER — Other Ambulatory Visit: Payer: Self-pay

## 2020-07-14 ENCOUNTER — Ambulatory Visit (INDEPENDENT_AMBULATORY_CARE_PROVIDER_SITE_OTHER): Payer: Medicare HMO | Admitting: Physician Assistant

## 2020-07-14 ENCOUNTER — Encounter: Payer: Self-pay | Admitting: Physician Assistant

## 2020-07-14 DIAGNOSIS — I10 Essential (primary) hypertension: Secondary | ICD-10-CM

## 2020-07-14 DIAGNOSIS — E119 Type 2 diabetes mellitus without complications: Secondary | ICD-10-CM | POA: Diagnosis not present

## 2020-07-14 DIAGNOSIS — E1159 Type 2 diabetes mellitus with other circulatory complications: Secondary | ICD-10-CM

## 2020-07-14 DIAGNOSIS — F17210 Nicotine dependence, cigarettes, uncomplicated: Secondary | ICD-10-CM

## 2020-07-14 LAB — POCT GLYCOSYLATED HEMOGLOBIN (HGB A1C): Hemoglobin A1C: 5.7 % — AB (ref 4.0–5.6)

## 2020-07-14 MED ORDER — HYDRALAZINE HCL 10 MG PO TABS: 10.0000 mg | ORAL_TABLET | Freq: Two times a day (BID) | ORAL | 2 refills | Status: DC

## 2020-07-14 NOTE — Progress Notes (Signed)
[facility] [place],  [address]  Internal MEDICINE  Office Visit Note  Patient Name: Donald Scott  [id number]  [id number]  Date of Service: 07/15/2020  Chief Complaint  Patient presents with  . Diabetes  . Hypertension    2 month fup    HPI Pt is here for f/u. -smokes 1ppd, not interested in quitting. Dicussed need for low dose CT chest cancer screening, but pt resistant at this time. Doesn't see any need for more testing despite explanation of importance. -Hx of diarrhea and frequent BM; needs refill of zenpep, but doesn't want to go back to GI. Went to Dr. Constance Haw and then was sent to different person that he wasn't happy with. Discussed that he would need to follow up with GI regarding this and could try calling the original GI office if he liked them better or could refer to new location. -He states he thinks his BP at home has been less than 932 systolic the times he has checked-though this isnt often. States he normally can tell if his BP runs high but hasnt noticed anything even though it is high today. Did not improve on recheck today. -He does not check BG at home  Current Medication: Outpatient Encounter Medications as of 07/14/2020  Medication Sig Note  . albuterol (VENTOLIN HFA) 108 (90 Base) MCG/ACT inhaler Inhale 1 puff into the lungs every 6 (six) hours as needed for wheezing or shortness of breath.   . Alcohol Swabs PADS Use with cleaning area to test blood sugar twice a day   . allopurinol (ZYLOPRIM) 100 MG tablet Take 1 tablet (100 mg total) by mouth daily.   Marland Kitchen amLODipine (NORVASC) 10 MG tablet Take 1 tablet (10 mg total) by mouth daily.   . Blood Glucose Calibration (TRUE METRIX LEVEL 1) Low SOLN Use as directed to check blood sugar twice a day TRUE METRIX LEVEL 1 CTRL SOLN   . fluticasone (FLONASE) 50 MCG/ACT nasal spray SHAKE LQ AND U 1 TO 2 SPRAYS IEN D FOR RHINITIS 05/22/2015: Received from: External Pharmacy  . hydrALAZINE  (APRESOLINE) 10 MG tablet Take 1 tablet (10 mg total) by mouth 2 (two) times daily.   Marland Kitchen losartan-hydrochlorothiazide (HYZAAR) 100-25 MG tablet Take 1 tablet by mouth daily.   Marland Kitchen omeprazole (PRILOSEC) 40 MG capsule Take 1 capsule (40 mg total) by mouth daily.   . TRUEplus Lancets 30G MISC Use to test blood sugar twice a day    No facility-administered encounter medications on file as of 07/14/2020.    Surgical History: Past Surgical History:  Procedure Laterality Date  . CAROTID STENT  2010  . Vascular bypass graft      Medical History: Past Medical History:  Diagnosis Date  . Diabetes mellitus ([place]) 05/22/2015  . Gout 05/22/2015  . Hypertension     Family History: Family History  Problem Relation Age of Onset  . Arthritis Mother     Social History   Socioeconomic History  . Marital status: Married    Spouse name: Not on file  . Number of children: Not on file  . Years of education: Not on file  . Highest education level: Not on file  Occupational History  . Not on file  Tobacco Use  . Smoking status: Current Every Day Smoker    Packs/day: 1.00    Types: Cigarettes  . Smokeless tobacco: Never Used  Substance and Sexual Activity  . Alcohol use: Yes    Alcohol/week: 18.0  standard drinks    Types: 18 Cans of beer per week  . Drug use: No  . Sexual activity: Not on file  Other Topics Concern  . Not on file  Social History Narrative  . Not on file   Social Determinants of Health   Financial Resource Strain: Not on file  Food Insecurity: Not on file  Transportation Needs: Not on file  Physical Activity: Not on file  Stress: Not on file  Social Connections: Not on file  Intimate Partner Violence: Not on file      Review of Systems  Constitutional: Negative for chills, fatigue and unexpected weight change.  HENT: Negative for congestion, postnasal drip, rhinorrhea, sneezing and sore throat.   Eyes: Negative for redness.  Respiratory: Negative for cough,  chest tightness and shortness of breath.   Cardiovascular: Negative for chest pain and palpitations.  Gastrointestinal: Positive for diarrhea. Negative for abdominal pain, constipation, nausea and vomiting.  Genitourinary: Negative for dysuria and frequency.  Musculoskeletal: Negative for arthralgias, back pain, joint swelling and neck pain.  Skin: Negative for rash.  Neurological: Negative.  Negative for tremors and numbness.  Hematological: Negative for adenopathy. Does not bruise/bleed easily.  Psychiatric/Behavioral: Negative for behavioral problems (Depression), sleep disturbance and suicidal ideas. The patient is nervous/anxious.     Vital Signs: BP (!) 170/96   Pulse 69   Temp 98.5 F (36.9 C)   Resp 16   Ht 5\' 7"  (1.702 m)   Wt 158 lb (71.7 kg)   SpO2 99%   BMI 24.75 kg/m    Physical Exam Constitutional:      General: He is not in acute distress.    Appearance: He is well-developed. He is not diaphoretic.  HENT:     Head: Normocephalic and atraumatic.     Mouth/Throat:     Pharynx: No oropharyngeal exudate.  Eyes:     Pupils: Pupils are equal, round, and reactive to light.  Neck:     Thyroid: No thyromegaly.     Vascular: No JVD.     Trachea: No tracheal deviation.  Cardiovascular:     Rate and Rhythm: Normal rate and regular rhythm.     Heart sounds: Normal heart sounds. No murmur heard. No friction rub. No gallop.   Pulmonary:     Effort: Pulmonary effort is normal. No respiratory distress.     Breath sounds: No wheezing or rales.  Chest:     Chest wall: No tenderness.  Abdominal:     General: Bowel sounds are normal. There is no distension.     Palpations: Abdomen is soft.     Tenderness: There is no abdominal tenderness.  Musculoskeletal:        General: Normal range of motion.     Cervical back: Normal range of motion and neck supple.  Lymphadenopathy:     Cervical: No cervical adenopathy.  Skin:    General: Skin is warm and dry.  Neurological:      Mental Status: He is alert and oriented to person, place, and time.     Cranial Nerves: No cranial nerve deficit.  Psychiatric:        Behavior: Behavior normal.        Thought Content: Thought content normal.        Judgment: Judgment normal.        Assessment/Plan: 1. Uncontrolled hypertension Continue amlodipine and hyzaar as prescribed. Will add hydralazine since still uncontrolled. Pt should monitor BP closely at home. - hydrALAZINE (  APRESOLINE) 10 MG tablet; Take 1 tablet (10 mg total) by mouth 2 (two) times daily.  Dispense: 60 tablet; Refill: 2  2. Type 2 diabetes mellitus with other circulatory complication, without long-term current use of insulin (HCC) - POCT glycosylated hemoglobin (Hb A1C) is 5.7 today. Pt will continue to control with diet and exercise.  3. Cigarette nicotine dependence without complication Again encouraged to stop smoking, however declines any interest in stopping. Also discussed low dose CT chest cancer screening but pt declines at this time   General Counseling: Pelham verbalizes understanding of the findings of todays visit and agrees with plan of treatment. I have discussed any further diagnostic evaluation that may be needed or ordered today. We also reviewed his medications today. he has been encouraged to call the office with any questions or concerns that should arise related to todays visit.  Hypertension Counseling:   The following hypertensive lifestyle modification were recommended and discussed:  1. Limiting alcohol intake to less than 1 oz/day of ethanol:(24 oz of beer or 8 oz of wine or 2 oz of 100-proof whiskey). 2. Take baby ASA 81 mg daily. 3. Importance of regular aerobic exercise and losing weight. 4. Reduce dietary saturated fat and cholesterol intake for overall cardiovascular health. 5. Maintaining adequate dietary potassium, calcium, and magnesium intake. 6. Regular monitoring of the blood pressure. 7. Reduce sodium intake  to less than 100 mmol/day (less than 2.3 gm of sodium or less than 6 gm of sodium choride)   Orders Placed This Encounter  Procedures  . POCT glycosylated hemoglobin (Hb A1C)    Meds ordered this encounter  Medications  . hydrALAZINE (APRESOLINE) 10 MG tablet    Sig: Take 1 tablet (10 mg total) by mouth 2 (two) times daily.    Dispense:  60 tablet    Refill:  2    This patient was seen by Drema Dallas, PA-C in collaboration with Dr. Clayborn Bigness as a part of collaborative care agreement.   Total time spent:30 Minutes Time spent includes review of chart, medications, test results, and follow up plan with the patient.      Dr Lavera Guise Internal medicine

## 2020-07-16 ENCOUNTER — Telehealth: Payer: Self-pay | Admitting: Hospice and Palliative Medicine

## 2020-07-16 NOTE — Progress Notes (Signed)
  Chronic Care Management   Note  07/16/2020 Name: YOUSAF SAINATO MRN: [id number] DOB: 05/19/1950  DAM ASHRAF is a 70 y.o. year old male who is a primary care patient of Luiz Ochoa, NP. I reached out to Tiburcio Pea by phone today in response to a referral sent by Mr. Caedyn Raygoza Kuan's PCP, Luiz Ochoa, NP.   Mr. Difiore was given information about Chronic Care Management services today including:  1. CCM service includes personalized support from designated clinical staff supervised by his physician, including individualized plan of care and coordination with other care providers 2. 24/7 contact phone numbers for assistance for urgent and routine care needs. 3. Service will only be billed when office clinical staff spend 20 minutes or more in a month to coordinate care. 4. Only one practitioner may furnish and bill the service in a calendar month. 5. The patient may stop CCM services at any time (effective at the end of the month) by phone call to the office staff.   Patient agreed to services and verbal consent obtained.   Follow up plan:   Carley Perdue [employer] Scheduler

## 2020-08-25 ENCOUNTER — Other Ambulatory Visit: Payer: Self-pay | Admitting: Nurse Practitioner

## 2020-08-25 ENCOUNTER — Encounter: Payer: Self-pay | Admitting: Physician Assistant

## 2020-08-25 ENCOUNTER — Ambulatory Visit (INDEPENDENT_AMBULATORY_CARE_PROVIDER_SITE_OTHER): Payer: Medicare HMO | Admitting: Nurse Practitioner

## 2020-08-25 ENCOUNTER — Other Ambulatory Visit: Payer: Self-pay

## 2020-08-25 DIAGNOSIS — E1159 Type 2 diabetes mellitus with other circulatory complications: Secondary | ICD-10-CM

## 2020-08-25 DIAGNOSIS — Z122 Encounter for screening for malignant neoplasm of respiratory organs: Secondary | ICD-10-CM

## 2020-08-25 DIAGNOSIS — I10 Essential (primary) hypertension: Secondary | ICD-10-CM | POA: Diagnosis not present

## 2020-08-25 DIAGNOSIS — F17210 Nicotine dependence, cigarettes, uncomplicated: Secondary | ICD-10-CM | POA: Diagnosis not present

## 2020-08-25 LAB — POCT GLYCOSYLATED HEMOGLOBIN (HGB A1C): Hemoglobin A1C: 5.6 % (ref 4.0–5.6)

## 2020-08-25 MED ORDER — HYDRALAZINE HCL 25 MG PO TABS: 25.0000 mg | ORAL_TABLET | Freq: Two times a day (BID) | ORAL | 1 refills | Status: DC

## 2020-08-25 NOTE — Progress Notes (Signed)
[facility], [place] [address]  Internal MEDICINE  Office Visit Note  Patient Name: Donald Scott  [id number]  [id number]  Date of Service: 08/25/2020  Chief Complaint  Patient presents with  . Follow-up    Discuss BP, chest pain started about 4 days, feels sore like a strain, doesn't feel that it is heart, review meds  . Diabetes  . Hypertension    HPI Donald Scott presents for a follow up visit to discuss his blood pressure and review his medications.  -blood pressure is elevated today, 226/100 and 180/92. He is currently taking hydralazine, amlodipine and losartan-hydrochlorothiazide.  -has had chest tightness x4 days, feels like possible muscle strain per patient. He states it is sore but not really bothersome. He works in a Nurse, learning disability and moves around Midwife.  -A1c is 5.6 today, down by 0.1 since 07/14/20. Continue current medications.   180/85-right 200/90 left    Current Medication: Outpatient Encounter Medications as of 08/25/2020  Medication Sig Note  . [DISCONTINUED] hydrALAZINE (APRESOLINE) 25 MG tablet Take 1 tablet (25 mg total) by mouth 2 (two) times daily.   Marland Kitchen albuterol (VENTOLIN HFA) 108 (90 Base) MCG/ACT inhaler Inhale 1 puff into the lungs every 6 (six) hours as needed for wheezing or shortness of breath.   . Alcohol Swabs PADS Use with cleaning area to test blood sugar twice a day   . allopurinol (ZYLOPRIM) 100 MG tablet Take 1 tablet (100 mg total) by mouth daily.   Marland Kitchen amLODipine (NORVASC) 10 MG tablet Take 1 tablet (10 mg total) by mouth daily.   . Blood Glucose Calibration (TRUE METRIX LEVEL 1) Low SOLN Use as directed to check blood sugar twice a day TRUE METRIX LEVEL 1 CTRL SOLN   . fluticasone (FLONASE) 50 MCG/ACT nasal spray SHAKE LQ AND U 1 TO 2 SPRAYS IEN D FOR RHINITIS 05/22/2015: Received from: External Pharmacy  . losartan-hydrochlorothiazide (HYZAAR) 100-25 MG tablet Take 1 tablet by mouth daily.   Marland Kitchen  omeprazole (PRILOSEC) 40 MG capsule Take 1 capsule (40 mg total) by mouth daily.   . TRUEplus Lancets 30G MISC Use to test blood sugar twice a day   . [DISCONTINUED] hydrALAZINE (APRESOLINE) 10 MG tablet Take 1 tablet (10 mg total) by mouth 2 (two) times daily.    No facility-administered encounter medications on file as of 08/25/2020.    Surgical History: Past Surgical History:  Procedure Laterality Date  . CAROTID STENT  2010  . Vascular bypass graft      Medical History: Past Medical History:  Diagnosis Date  . Diabetes mellitus ([place]) 05/22/2015  . Gout 05/22/2015  . Hypertension     Family History: Family History  Problem Relation Age of Onset  . Arthritis Mother     Social History   Socioeconomic History  . Marital status: Married    Spouse name: Not on file  . Number of children: Not on file  . Years of education: Not on file  . Highest education level: Not on file  Occupational History  . Not on file  Tobacco Use  . Smoking status: Current Every Day Smoker    Packs/day: 1.00    Types: Cigarettes  . Smokeless tobacco: Never Used  Substance and Sexual Activity  . Alcohol use: Yes    Alcohol/week: 18.0 standard drinks    Types: 18 Cans of beer per week  . Drug use: No  . Sexual activity: Not on file  Other Topics  Concern  . Not on file  Social History Narrative  . Not on file   Social Determinants of Health   Financial Resource Strain: Not on file  Food Insecurity: Not on file  Transportation Needs: Not on file  Physical Activity: Not on file  Stress: Not on file  Social Connections: Not on file  Intimate Partner Violence: Not on file      Review of Systems  Constitutional: Negative for chills, fatigue and unexpected weight change.  HENT: Negative for congestion, rhinorrhea, sneezing and sore throat.   Eyes: Negative for redness.  Respiratory: Positive for cough. Negative for chest tightness and shortness of breath.   Cardiovascular: Negative  for chest pain and palpitations.  Gastrointestinal: Negative for abdominal pain, constipation, diarrhea, nausea and vomiting.  Endocrine: Negative.   Genitourinary: Negative for dysuria and frequency.  Musculoskeletal: Negative for arthralgias, back pain, joint swelling and neck pain.  Skin: Negative for rash.  Neurological: Negative.  Negative for tremors and numbness.  Hematological: Negative for adenopathy. Does not bruise/bleed easily.  Psychiatric/Behavioral: Negative for behavioral problems (Depression), sleep disturbance and suicidal ideas. The patient is not nervous/anxious.     Vital Signs: BP (!) 180/92   Pulse 72   Temp (!) 97.2 F (36.2 C)   Resp 16   Ht 5\' 7"  (1.702 m)   Wt 152 lb 9.6 oz (69.2 kg)   SpO2 99%   BMI 23.90 kg/m    Physical Exam Constitutional:      General: He is not in acute distress.    Appearance: Normal appearance. He is normal weight. He is not ill-appearing.  HENT:     Head: Normocephalic and atraumatic.  Cardiovascular:     Rate and Rhythm: Normal rate and regular rhythm.     Pulses: Normal pulses.     Heart sounds: Normal heart sounds.  Pulmonary:     Effort: Pulmonary effort is normal.     Breath sounds: Normal breath sounds.  Skin:    General: Skin is warm and dry.     Capillary Refill: Capillary refill takes less than 2 seconds.  Neurological:     Mental Status: He is alert.  Psychiatric:        Mood and Affect: Mood normal.        Behavior: Behavior normal.    Assessment/Plan: 1. Essential hypertension Continue amlodipine and hyzaar as prescribed. Will increase hydralazine since still uncontrolled. Pt should monitor BP closely at home. - hydrALAZINE (APRESOLINE) 25 MG tablet; Take 1 tablet (25 mg total) by mouth 2 (two) times daily.  Dispense: 180 tablet; Refill: 1  2. Encounter for screening for lung cancer Patient agreed to have a CT chest to screen for lung cancer. This is recommended because he is a current everyday 1ppd  smoker.  - CT CHEST LUNG CA SCREEN LOW DOSE W/O CM; Future  3. Type 2 diabetes mellitus with other circulatory complication, without long-term current use of insulin (HCC) - POCT glycosylated hemoglobin (Hb A1C) is 5.6 today. Pt will continue to control with diet and exercise. - POCT HgB A1C  4. Cigarette nicotine dependence without complication Again encouraged to stop smoking, however declines any interest in stopping. Also discussed low dose CT chest cancer screening, he agreed after discussing the risks and benefits of having a screening chest CT done.   General Counseling: Donald Scott verbalizes understanding of the findings of todays visit and agrees with plan of treatment. I have discussed any further diagnostic evaluation that may be  needed or ordered today. We also reviewed his medications today. he has been encouraged to call the office with any questions or concerns that should arise related to todays visit.  Hypertension Counseling:   The following hypertensive lifestyle modification were recommended and discussed:  1. Limiting alcohol intake to less than 1 oz/day of ethanol:(24 oz of beer or 8 oz of wine or 2 oz of 100-proof whiskey). 2. Take baby ASA 81 mg daily. 3. Importance of regular aerobic exercise and losing weight. 4. Reduce dietary saturated fat and cholesterol intake for overall cardiovascular health. 5. Maintaining adequate dietary potassium, calcium, and magnesium intake. 6. Regular monitoring of the blood pressure. 7. Reduce sodium intake to less than 100 mmol/day (less than 2.3 gm of sodium or less than 6 gm of sodium choride)    General Counseling: Donald Scott verbalizes understanding of the findings of todays visit and agrees with plan of treatment. I have discussed any further diagnostic evaluation that may be needed or ordered today. We also reviewed his medications today. he has been encouraged to call the office with any questions or concerns that should arise  related to todays visit.    Orders Placed This Encounter  Procedures  . CT CHEST LUNG CA SCREEN LOW DOSE W/O CM  . POCT HgB A1C    Meds ordered this encounter  Medications  . DISCONTD: hydrALAZINE (APRESOLINE) 25 MG tablet    Sig: Take 1 tablet (25 mg total) by mouth 2 (two) times daily.    Dispense:  60 tablet    Refill:  1   Return in about 4 weeks (around 09/22/2020) for F/U BP with Lauren PCP.   Total time spent: 30 Minutes Time spent includes review of chart, medications, test results, and follow up plan with the patient.   [place] Controlled Substance Database was reviewed by me.  This patient was seen by Jonetta Osgood, FNP-C in collaboration with Dr. Clayborn Bigness as a part of collaborative care agreement.  Dr Lavera Guise Internal medicine

## 2020-09-08 ENCOUNTER — Telehealth: Payer: Self-pay | Admitting: Pharmacist

## 2020-09-08 ENCOUNTER — Telehealth: Payer: Self-pay | Admitting: *Deleted

## 2020-09-08 DIAGNOSIS — Z87891 Personal history of nicotine dependence: Secondary | ICD-10-CM

## 2020-09-08 DIAGNOSIS — F17200 Nicotine dependence, unspecified, uncomplicated: Secondary | ICD-10-CM

## 2020-09-08 DIAGNOSIS — Z122 Encounter for screening for malignant neoplasm of respiratory organs: Secondary | ICD-10-CM

## 2020-09-08 NOTE — Progress Notes (Addendum)
Chronic Care Management Pharmacy Assistant   Name: Donald Scott  MRN: [id number] DOB: [birth year]  Donald Scott is an 70 y.o. year old male who presents for his initial CCM visit with the clinical pharmacist.  Reason for Encounter: Chart Prep   Conditions to be addressed/monitored: HTN,GERD, DM, Gout, Anxiety, PVD.   Primary concerns for visit include: HTN,   Recent office visits:  08/25/20 Jonetta Osgood, NP. For follow-up. CHANGED/INCREASED Hydralazine 25 mg 2 times daily.  07/14/20 McDonough, Si Gaul, PA-C. For follow-up. STARTED Hydralazine 10 mg 2 times daily.  05/16/20 Luiz Ochoa, NP. For HTN. No medication changes.  05/02/20 Luiz Ochoa, NP. For HTN. INCREASED/CHANGED Losartan Potassium-HCTZ 100-25 mg 1 tablet daily. CHANGED Amlodipine Besylate 10 mg daily. STOPPED Azithromycin and Benzonatate. 04/15/20 Luiz Ochoa, NP. For Type 2 DM. STARTED Allopurinol 100 mg daily, Azithromycin 250 mg take one tablet by mouth daily for 14 days, Benzonatate 100 mg 2 times daily PRN.  Recent consult visits:  None in the last six months   Hospital visits:  None in previous 6 months   Medication History: Losartan-Hydrochlorothiazide 100-25 mg daily 90 DS 06/15/20  Medications: Outpatient Encounter Medications as of 09/08/2020  Medication Sig Note   albuterol (VENTOLIN HFA) 108 (90 Base) MCG/ACT inhaler Inhale 1 puff into the lungs every 6 (six) hours as needed for wheezing or shortness of breath.    Alcohol Swabs PADS Use with cleaning area to test blood sugar twice a day    allopurinol (ZYLOPRIM) 100 MG tablet Take 1 tablet (100 mg total) by mouth daily.    amLODipine (NORVASC) 10 MG tablet Take 1 tablet (10 mg total) by mouth daily.    Blood Glucose Calibration (TRUE METRIX LEVEL 1) Low SOLN Use as directed to check blood sugar twice a day TRUE METRIX LEVEL 1 CTRL SOLN    fluticasone (FLONASE) 50 MCG/ACT nasal spray SHAKE LQ AND U 1 TO 2 SPRAYS IEN D FOR RHINITIS  05/22/2015: Received from: External Pharmacy   hydrALAZINE (APRESOLINE) 25 MG tablet TAKE 1 TABLET(25 MG) BY MOUTH TWICE DAILY    losartan-hydrochlorothiazide (HYZAAR) 100-25 MG tablet Take 1 tablet by mouth daily.    omeprazole (PRILOSEC) 40 MG capsule Take 1 capsule (40 mg total) by mouth daily.    TRUEplus Lancets 30G MISC Use to test blood sugar twice a day    No facility-administered encounter medications on file as of 09/08/2020.   Have you seen any other providers since your last visit?  Patient stated no.  Any changes in your medications or health? Patient stated no.  Any side effects from any medications? Patient stated no.  Do you have an symptoms or problems not managed by your medications? Patient stated no.  Any concerns about your health right now? Patient stated he is concerned about his blood pressure none of his medications are working.   Has your provider asked that you check blood pressure, blood sugar, or follow special diet at home? Patient stated he checks his blood pressure   Do you get any type of exercise on a regular basis? Patient stated he's active all day long, he works at a car shop.  Can you think of a goal you would like to reach for your health? Patient stated get his blood pressure under control.   Do you have any problems getting your medications? Patient stated no.  Is there anything that you would like to discuss during the appointment? Patient stated no.  Please bring medications and supplements to appointment, patient reminded of his face to face on 09/09/20 at 9 am.   [place], [place] Pharmacist Assistant [phone]

## 2020-09-08 NOTE — Telephone Encounter (Signed)
Received referral for initial lung cancer screening scan. Contacted patient and obtained smoking history, current smoker, 1 ppd x 50 yrs. as well as answering questions related to screening process. Patient denies signs of lung cancer such as weight loss or hemoptysis. Patient denies comorbidity that would prevent curative treatment if lung cancer were found. Patient is scheduled for shared decision making visit and CT scan on 09/17/20 @ 10:00 am.

## 2020-09-09 ENCOUNTER — Other Ambulatory Visit: Payer: Self-pay

## 2020-09-09 ENCOUNTER — Ambulatory Visit: Payer: Medicare HMO | Admitting: Pharmacist

## 2020-09-09 DIAGNOSIS — K219 Gastro-esophageal reflux disease without esophagitis: Secondary | ICD-10-CM

## 2020-09-09 DIAGNOSIS — E1159 Type 2 diabetes mellitus with other circulatory complications: Secondary | ICD-10-CM

## 2020-09-09 DIAGNOSIS — I10 Essential (primary) hypertension: Secondary | ICD-10-CM

## 2020-09-09 NOTE — Progress Notes (Signed)
Chronic Care Management Pharmacy Note  09/09/2020 Name:  Donald Scott MRN:  [id number] DOB:  1950-05-31  Summary: Initial visit with PharmD.  Reviewed patients home medications to match up with prescribed medication especially for BP as it seems to have been elevated lately.  He did not have amlodipine with him so unclear if he has been taking.  Written instructions on prescribed medication regimen given.  Has not been checking BP at home lately.  Recommendations/Changes made from today's visit: No changed to meds  Plan: Have asked him to really monitor BP and record 1 hour after taking meds until upcoming visit with Alyssa.  Hopefully it is an adherence issue and BP improves.   Subjective: Donald Scott is an 70 y.o. year old male who is a primary patient of McDonough, Si Gaul, PA-C.  The CCM team was consulted for assistance with disease management and care coordination needs.    Engaged with patient face to face for initial visit in response to provider referral for pharmacy case management and/or care coordination services.   Consent to Services:  The patient was given the following information about Chronic Care Management services today, agreed to services, and gave verbal consent: 1. CCM service includes personalized support from designated clinical staff supervised by the primary care provider, including individualized plan of care and coordination with other care providers 2. 24/7 contact phone numbers for assistance for urgent and routine care needs. 3. Service will only be billed when office clinical staff spend 20 minutes or more in a month to coordinate care. 4. Only one practitioner may furnish and bill the service in a calendar month. 5.The patient may stop CCM services at any time (effective at the end of the month) by phone call to the office staff. 6. The patient will be responsible for cost sharing (co-pay) of up to 20% of the service fee (after annual deductible is  met). Patient agreed to services and consent obtained.  Patient Care Team: Carolynne Edouard as PCP - General (Physician Assistant) Edythe Clarity, [facility] as Pharmacist (Pharmacist)  Recent office visits:  08/25/20 Jonetta Osgood, NP. For follow-up. CHANGED/INCREASED Hydralazine 25 mg 2 times daily.  07/14/20 McDonough, Si Gaul, PA-C. For follow-up. STARTED Hydralazine 10 mg 2 times daily.  05/16/20 Luiz Ochoa, NP. For HTN. No medication changes.  05/02/20 Luiz Ochoa, NP. For HTN. INCREASED/CHANGED Losartan Potassium-HCTZ 100-25 mg 1 tablet daily. CHANGED Amlodipine Besylate 10 mg daily. STOPPED Azithromycin and Benzonatate. 04/15/20 Luiz Ochoa, NP. For Type 2 DM. STARTED Allopurinol 100 mg daily, Azithromycin 250 mg take one tablet by mouth daily for 14 days, Benzonatate 100 mg 2 times daily PRN.  Recent consult visits:  None in the last six months   Hospital visits:  None in previous 6 months   Medication History: Losartan-Hydrochlorothiazide 100-25 mg daily 90 DS 06/15/20   Objective:  Lab Results  Component Value Date   CREATININE 1.10 12/17/2019   BUN 16 12/17/2019   GFRNONAA 67 12/17/2019   GFRAA 78 12/17/2019   NA 136 12/17/2019   K 3.9 12/17/2019   CALCIUM 9.0 12/17/2019   CO2 25 12/17/2019   GLUCOSE 143 (H) 12/17/2019    Lab Results  Component Value Date/Time   HGBA1C 5.6 08/25/2020 09:05 AM   HGBA1C 5.7 (A) 07/14/2020 11:11 AM    Last diabetic Eye exam: No results found for: HMDIABEYEEXA  Last diabetic Foot exam: No results found for: HMDIABFOOTEX   Lab Results  Component Value Date   CHOL 152 12/17/2019   HDL 63 12/17/2019   LDLCALC 72 12/17/2019   TRIG 89 12/17/2019    Hepatic Function Latest Ref Rng & Units 12/17/2019 04/30/2014  Total Protein 6.0 - 8.5 g/dL 7.6 7.3  Albumin 3.7 - 4.7 g/dL 4.4 3.6  AST 0 - 40 IU/L 18 31  ALT 0 - 44 IU/L 18 41  Alk Phosphatase 44 - 121 IU/L 94 76  Total Bilirubin 0.0 - 1.2 mg/dL 0.6 0.8     Lab Results  Component Value Date/Time   TSH 1.900 12/17/2019 09:10 AM   FREET4 1.01 12/17/2019 09:10 AM    CBC Latest Ref Rng & Units 12/17/2019 04/30/2014  WBC 3.4 - 10.8 x10E3/uL 7.9 7.0  Hemoglobin 13.0 - 17.7 g/dL 13.8 13.0  Hematocrit 37.5 - 51.0 % 43.7 39.6(L)  Platelets 150 - 450 x10E3/uL 159 127(L)    No results found for: VD25OH  Clinical ASCVD: No  The 10-year ASCVD risk score Mikey Bussing DC Jr., et al., 2013) is: 69.5%   Values used to calculate the score:     Age: 70 years     Sex: Male     Is Non-Hispanic African American: Yes     Diabetic: Yes     Tobacco smoker: Yes     Systolic Blood Pressure: 825 mmHg     Is BP treated: Yes     HDL Cholesterol: 63 mg/dL     Total Cholesterol: 152 mg/dL    Depression screen [facility] 2/9 08/25/2020 07/14/2020 04/15/2020  Decreased Interest 0 0 0  Down, Depressed, Hopeless 0 0 0  PHQ - 2 Score 0 0 0     Social History   Tobacco Use  Smoking Status Current Every Day Smoker  . Packs/day: 1.00  . Types: Cigarettes  Smokeless Tobacco Never Used   BP Readings from Last 3 Encounters:  08/25/20 (!) 180/92  07/14/20 (!) 170/96  05/16/20 138/86   Pulse Readings from Last 3 Encounters:  08/25/20 72  07/14/20 69  05/16/20 60   Wt Readings from Last 3 Encounters:  08/25/20 152 lb 9.6 oz (69.2 kg)  07/14/20 158 lb (71.7 kg)  05/16/20 159 lb (72.1 kg)   BMI Readings from Last 3 Encounters:  08/25/20 23.90 kg/m  07/14/20 24.75 kg/m  05/16/20 24.90 kg/m    Assessment/Interventions: Review of patient past medical history, allergies, medications, health status, including review of consultants reports, laboratory and other test data, was performed as part of comprehensive evaluation and provision of chronic care management services.   SDOH:  (Social Determinants of Health) assessments and interventions performed: Yes   Financial Resource Strain: Not on file    SDOH Screenings   Alcohol Screen: Low Risk   . Last Alcohol  Screening Score (AUDIT): 5  Depression (PHQ2-9): Low Risk   . PHQ-2 Score: 0  Financial Resource Strain: Not on file  Food Insecurity: Not on file  Housing: Not on file  Physical Activity: Not on file  Social Connections: Not on file  Stress: Not on file  Tobacco Use: High Risk  . Smoking Tobacco Use: Current Every Day Smoker  . Smokeless Tobacco Use: Never Used  Transportation Needs: Not on file    CCM Care Plan  Allergies  Allergen Reactions  . Iodinated Diagnostic Agents Other (See Comments)    Other Reaction: GI Upset  . Other Hives, Other (See Comments) and Rash    Uncoded Allergy. Allergen: CT Dye Uncoded Allergy. Allergen: ISOVUE  300, Other Reaction: Itching (even w/prep)  . Penicillin G Other (See Comments)  . Penicillins     Medications Reviewed Today    Reviewed by Edythe Clarity, [facility] (Pharmacist) on 09/09/20 at 1008  Med List Status: <None>  Medication Order Taking? Sig Documenting Provider Last Dose Status Informant  albuterol (VENTOLIN HFA) 108 (90 Base) MCG/ACT inhaler [id number] Yes Inhale 1 puff into the lungs every 6 (six) hours as needed for wheezing or shortness of breath. [provider] Taking Active   Alcohol Swabs PADS [id number] Yes Use with cleaning area to test blood sugar twice a day Ronnell Freshwater, NP Taking Active   allopurinol (ZYLOPRIM) 100 MG tablet [id number] Yes Take 1 tablet (100 mg total) by mouth daily. Luiz Ochoa, NP Taking Active   amLODipine (NORVASC) 10 MG tablet [id number] Yes Take 1 tablet (10 mg total) by mouth daily. Luiz Ochoa, NP Taking Active   Blood Glucose Calibration (TRUE METRIX LEVEL 1) Low SOLN [id number] Yes Use as directed to check blood sugar twice a day TRUE METRIX LEVEL 1 CTRL SOLN Boscia, Heather E, NP Taking Active   fluticasone (FLONASE) 50 MCG/ACT nasal spray [id number] Yes SHAKE LQ AND U 1 TO 2 SPRAYS IEN D FOR RHINITIS [provider] Taking Active            Med Note [facility],  Levander Campion   Thu May 22, 2015 11:30 AM) Received from: External Pharmacy  hydrALAZINE (APRESOLINE) 25 MG tablet [id number] Yes TAKE 1 TABLET(25 MG) BY MOUTH TWICE DAILY Lavera Guise, MD Taking Active   losartan-hydrochlorothiazide [facility]) 100-25 MG tablet [id number] Yes Take 1 tablet by mouth daily. Luiz Ochoa, NP Taking Active   omeprazole (PRILOSEC) 40 MG capsule [id number] Yes Take 1 capsule (40 mg total) by mouth daily. Ronnell Freshwater, NP Taking Active   TRUEplus Lancets 30G MISC [id number] Yes Use to test blood sugar twice a day Ronnell Freshwater, NP Taking Active           Patient Active Problem List   Diagnosis Date Noted  . Generalized anxiety disorder 05/08/2019  . Encounter for general adult medical examination with abnormal findings 12/19/2017  . Acute recurrent pansinusitis 12/19/2017  . Dental caries 12/19/2017  . Primary osteoarthritis involving multiple joints 12/19/2017  . Gastroesophageal reflux disease without esophagitis 12/19/2017  . Peripheral vascular disease, unspecified ([place]) 04/20/2017  . Other chronic pancreatitis ([place]) 04/20/2017  . Nicotine dependence, cigarettes, uncomplicated 57/90/3833  . Hypokalemia 04/20/2017  . Diabetes mellitus ([place]) 05/22/2015  . Essential hypertension 05/22/2015  . Gout 05/22/2015  . Nerve root disorder 10/21/2014  . Arthritis of knee, degenerative 03/27/2014    Immunization History  Administered Date(s) Administered  . DTaP 07/31/2015  . Influenza Inj Mdck Quad Pf 01/16/2018, 12/18/2019  . Influenza-Unspecified 04/21/2017  . PFIZER(Purple Top)SARS-COV-2 Vaccination 07/20/2019, 08/10/2019, 04/28/2020  . Pneumococcal Polysaccharide-23 03/13/2014    Conditions to be addressed/monitored:  HTN, GERD, DM,   Care Plan : General Pharmacy (Adult)  Updates made by Edythe Clarity, RPH since 09/09/2020 12:00 AM    Problem: HTN, DM, GERD   Priority: High  Onset Date: 09/09/2020    Long-Range Goal: Patient-Specific Goal    Start Date: 09/09/2020  Expected End Date: 03/11/2021  This Visit's Progress: On track  Priority: High  Note:   Current Barriers:  . Unable to independently monitor therapeutic efficacy . Unable to achieve control of BP   Pharmacist Clinical Goal(s):  Marland Kitchen Patient will achieve  adherence to monitoring guidelines and medication adherence to achieve therapeutic efficacy . achieve control of BP as evidenced by home monitoring . adhere to prescribed medication regimen as evidenced by fill dates/pill box . contact provider office for questions/concerns as evidenced notation of same in electronic health record through collaboration with PharmD and provider.   Interventions: . 1:1 collaboration with Mylinda Latina, PA-C regarding development and update of comprehensive plan of care as evidenced by provider attestation and co-signature . Inter-disciplinary care team collaboration (see longitudinal plan of care) . Comprehensive medication review performed; medication list updated in electronic medical record  Hypertension (BP goal <140/90) -Not ideally controlled -Current treatment: . Amlodipine 47m daily - did not bring today unclear if he is taking this - AM . Hydralazine 21mBID . 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 -Denies hypotensive/hypertensive symptoms -Educated on BP goals and benefits of medications for prevention of heart attack, stroke and kidney damage; Daily salt intake goal < 2300 mg; Importance of home blood pressure monitoring; Symptoms of hypotension and importance of maintaining adequate hydration; -Counseled to monitor BP at home 3 times per week one hour after taking  medications, document, and provide log at future appointments -Recommended to continue current medication Counseled on importance of taking all 3 medications for BP daily. Asked to monitor and record BP diligently until he comes back to see Alyssa in a few week and bring logs with him.  At that time we have two options for BP, we can either continue to increase hydralazine or could look to add something like spironolactone.    Diabetes (A1c goal <6.5%) -Controlled -Current medications: . None -Medications previously tried: metformin  -Current home glucose readings . fasting glucose: not checking . post prandial glucose: not checking -Denies hypoglycemic/hyperglycemic symptoms -Current meal patterns: limiting salt, does admit to one pepsi per day -Current exercise: see above -Educated on A1c and blood sugar goals; Complications of diabetes including kidney damage, retinal damage, and cardiovascular disease; Exercise goal of 150 minutes per week; -Counseled to check feet daily and get yearly eye exams -Recommended continue current management strategy and regular A1c checks  GERD (Goal: Minimize symptoms) -Controlled -Current treatment  . Omeprazole 63m66mily -Medications previously tried: none noted -Reports symptoms are controlled as long as he takes this medication -Verbalizes appropriate medication administration.  -Recommended to continue current medication Avoid trigger foods as much as possible.   Patient Goals/Self-Care Activities . Patient will:  - take medications as prescribed focus on medication adherence by pill box check blood pressure 3 times weekly, document, and provide at future appointments engage in dietary modifications by limiting salt intake  Follow Up Plan: The care management team will reach out to the patient again over the next 90 days.        Medication Assistance: None required.  Patient affirms current coverage meets  needs.  Compliance/Adherence/Medication fill history: Care Gaps: Shingrix Vaccine  Star-Rating Drugs: Losartan-Hydrochlorothiazide 100-25 mg daily 90 DS 06/15/20  [id number] [id number]: [phone]: [phone] pill box? Yes Pt endorses 100% compliance  We discussed: Benefits of medication synchronization, packaging and delivery as well as enhanced pharmacist oversight with [facility]. Patient decided to: Continue current medication management strategy  Care Plan and Follow Up Patient Decision:  Patient agrees to Care Plan and Follow-up.  Plan: The care management team will reach out to the patient again over the next 90 days.  Beverly Milch, PharmD Clinical Pharmacist [facility] [phone]

## 2020-09-09 NOTE — Patient Instructions (Addendum)
Visit Information  Goals Addressed            This Visit's Progress   . Track and Manage My Blood Pressure-Hypertension       Timeframe:  Long-Range Goal Priority:  High Start Date:  09/09/20                           Expected End Date: 03/11/21                      Follow Up Date 01/02/21   - check blood pressure 3 times per week - choose a place to take my blood pressure (home, clinic or office, retail store) - write blood pressure results in a log or diary    Why is this important?    You won't feel high blood pressure, but it can still hurt your blood vessels.   High blood pressure can cause heart or kidney problems. It can also cause a stroke.   Making lifestyle changes like losing a little weight or eating less salt will help.   Checking your blood pressure at home and at different times of the day can help to control blood pressure.   If the doctor prescribes medicine remember to take it the way the doctor ordered.   Call the office if you cannot afford the medicine or if there are questions about it.     Notes:       Patient Care Plan: General Pharmacy (Adult)    Problem Identified: HTN, DM, GERD   Priority: High  Onset Date: 09/09/2020    Long-Range Goal: Patient-Specific Goal   Start Date: 09/09/2020  Expected End Date: 03/11/2021  This Visit's Progress: On track  Priority: High  Note:   Current Barriers:  . Unable to independently monitor therapeutic efficacy . Unable to achieve control of BP   Pharmacist Clinical Goal(s):  Marland Kitchen Patient will achieve adherence to monitoring guidelines and medication adherence to achieve therapeutic efficacy . achieve control of BP as evidenced by home monitoring . adhere to prescribed medication regimen as evidenced by fill dates/pill box . contact provider office for questions/concerns as evidenced notation of same in electronic health record through collaboration with PharmD and provider.   Interventions: . 1:1  collaboration with Donald Latina, PA-C regarding development and update of comprehensive plan of care as evidenced by provider attestation and co-signature . Inter-disciplinary care team collaboration (see longitudinal plan of care) . Comprehensive medication review performed; medication list updated in electronic medical record  Hypertension (BP goal <140/90) -Not ideally controlled -Current treatment: . Amlodipine 10mg  daily - did not bring today unclear if he is taking this - AM . Hydralazine 25mg  BID . 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 -Denies hypotensive/hypertensive symptoms -Educated on BP goals and benefits of medications for prevention of heart attack, stroke and kidney damage; Daily salt intake goal < 2300 mg; Importance of home blood pressure monitoring; Symptoms of hypotension and importance of maintaining adequate hydration; -Counseled to monitor BP at home 3 times per week one hour after taking medications, document, and provide log at future appointments -Recommended to continue current medication Counseled on importance of taking all 3  medications for BP daily. Asked to monitor and record BP diligently until he comes back to see Donald Scott in a few week and bring logs with him.  At that time we have two options for BP, we can either continue to increase hydralazine or could look to add something like spironolactone.    Diabetes (A1c goal <6.5%) -Controlled -Current medications: . None -Medications previously tried: metformin  -Current home glucose readings . fasting glucose: not checking . post prandial glucose: not checking -Denies hypoglycemic/hyperglycemic  symptoms -Current meal patterns: limiting salt, does admit to one pepsi per day -Current exercise: see above -Educated on A1c and blood sugar goals; Complications of diabetes including kidney damage, retinal damage, and cardiovascular disease; Exercise goal of 150 minutes per week; -Counseled to check feet daily and get yearly eye exams -Recommended continue current management strategy and regular A1c checks  GERD (Goal: Minimize symptoms) -Controlled -Current treatment  . Omeprazole 40mg  daily -Medications previously tried: none noted -Reports symptoms are controlled as long as he takes this medication -Verbalizes appropriate medication administration.  -Recommended to continue current medication Avoid trigger foods as much as possible.   Patient Goals/Self-Care Activities . Patient will:  - take medications as prescribed focus on medication adherence by pill box check blood pressure 3 times weekly, document, and provide at future appointments engage in dietary modifications by limiting salt intake  Follow Up Plan: The care management team will reach out to the patient again over the next 90 days.       Donald Scott was given information about Chronic Care Management services today including:  1. CCM service includes personalized support from designated clinical staff supervised by his physician, including individualized plan of care and coordination with other care providers 2. 24/7 contact phone numbers for assistance for urgent and routine care needs. 3. Standard insurance, coinsurance, copays and deductibles apply for chronic care management only during months in which we provide at least 20 minutes of these services. Most insurances cover these services at 100%, however patients may be responsible for any copay, coinsurance and/or deductible if applicable. This service may help you avoid the need for more expensive face-to-face services. 4. Only one practitioner may furnish  and bill the service in a calendar month. 5. The patient may stop CCM services at any time (effective at the end of the month) by phone call to the office staff.  Patient agreed to services and verbal consent obtained.   The patient verbalized understanding of instructions, educational materials, and care plan provided today and agreed to receive a mailed copy of patient instructions, educational materials, and care plan.  Telephone follow up appointment with pharmacy team member scheduled for: 17 days  Edythe Clarity, [place] Blood Pressure Record Sheet To take your blood pressure, you will need a blood pressure machine. You can buy a blood pressure machine (blood pressure monitor) at your clinic, drug store, or online. When choosing one, consider:  An automatic monitor that has an arm cuff.  A cuff that wraps snugly around your upper arm. You should be able to fit only one finger between your arm and the cuff.  A device that stores blood pressure reading results.  Do not choose a monitor that measures your blood pressure from your wrist or finger. Follow your health care provider's instructions for how to take your blood pressure. To use this form:  Get one reading in the morning (a.m.) before you take any medicines.  Get one reading in the  evening (p.m.) before supper.  Take at least 2 readings with each blood pressure check. This makes sure the results are correct. Wait 1-2 minutes between measurements.  Write down the results in the spaces on this form.  Repeat this once a week, or as told by your health care provider.  Make a follow-up appointment with your health care provider to discuss the results. Blood pressure log Date: _______________________  a.m. _____________________(1st reading) _____________________(2nd reading)  p.m. _____________________(1st reading) _____________________(2nd reading) Date: _______________________  a.m.  _____________________(1st reading) _____________________(2nd reading)  p.m. _____________________(1st reading) _____________________(2nd reading) Date: _______________________  a.m. _____________________(1st reading) _____________________(2nd reading)  p.m. _____________________(1st reading) _____________________(2nd reading) Date: _______________________  a.m. _____________________(1st reading) _____________________(2nd reading)  p.m. _____________________(1st reading) _____________________(2nd reading) Date: _______________________  a.m. _____________________(1st reading) _____________________(2nd reading)  p.m. _____________________(1st reading) _____________________(2nd reading) This information is not intended to replace advice given to you by your health care provider. Make sure you discuss any questions you have with your health care provider. Document Revised: 07/11/2019 Document Reviewed: 07/11/2019 Elsevier Patient Education  2021 [employer].

## 2020-09-17 ENCOUNTER — Inpatient Hospital Stay: Payer: Medicare HMO | Attending: Oncology | Admitting: Hospice and Palliative Medicine

## 2020-09-17 ENCOUNTER — Ambulatory Visit
Admission: RE | Admit: 2020-09-17 | Discharge: 2020-09-17 | Disposition: A | Discharge status: Home / Self Care | Payer: Medicare HMO | Source: Ambulatory Visit | Attending: Oncology | Admitting: Oncology

## 2020-09-17 ENCOUNTER — Other Ambulatory Visit: Payer: Self-pay

## 2020-09-17 DIAGNOSIS — Z122 Encounter for screening for malignant neoplasm of respiratory organs: Secondary | ICD-10-CM

## 2020-09-17 DIAGNOSIS — R69 Illness, unspecified: Secondary | ICD-10-CM | POA: Diagnosis not present

## 2020-09-17 DIAGNOSIS — F17200 Nicotine dependence, unspecified, uncomplicated: Secondary | ICD-10-CM | POA: Insufficient documentation

## 2020-09-17 DIAGNOSIS — Z87891 Personal history of nicotine dependence: Secondary | ICD-10-CM | POA: Diagnosis not present

## 2020-09-17 NOTE — Progress Notes (Signed)
Virtual Visit via Telephone Note  I connected withNAME@ on 09/17/20 [email or web address]@ by telephone and verified that I am speaking with the correct person using two identifiers.   I discussed the limitations of evaluation and management by telemedicine and the availability of in person appointments. The patient expressed understanding and agreed to proceed.  Location: Patient: [facility] Provider: Clinic   In accordance with CMS guidelines, patient has met eligibility criteria including age, absence of signs or symptoms of lung cancer.  Social History   Tobacco Use   Smoking status: Every Day    Packs/day: 1.00    Years: 50.00    Pack years: 50.00    Types: Cigarettes   Smokeless tobacco: Never  Substance Use Topics   Alcohol use: Yes    Alcohol/week: 18.0 standard drinks    Types: 18 Cans of beer per week   Drug use: No      A shared decision-making session was conducted prior to the performance of CT scan. This includes one or more decision aids, includes benefits and harms of screening, follow-up diagnostic testing, over-diagnosis, false positive rate, and total radiation exposure.   Counseling on the importance of adherence to annual lung cancer LDCT screening, impact of co-morbidities, and ability or willingness to undergo diagnosis and treatment is imperative for compliance of the program.   Counseling on the importance of continued smoking cessation for former smokers; the importance of smoking cessation for current smokers, and information about tobacco cessation interventions have been given to patient including [place] and [facility] quit [place] programs.   Written order for lung cancer screening with LDCT has been given to the patient and any and all questions have been answered to the best of my abilities.    Yearly follow up will be coordinated by Burgess Estelle, Thoracic Navigator.  Time Total: 5 minutes  Visit consisted of counseling and education dealing with  complex health screening. Greater than 50%  of this time was spent counseling and coordinating care related to the above assessment and plan.  Signed by: Altha Harm, PhD, NP-C

## 2020-09-21 ENCOUNTER — Other Ambulatory Visit: Payer: Self-pay | Admitting: Nurse Practitioner

## 2020-09-22 ENCOUNTER — Encounter: Payer: Self-pay | Admitting: Physician Assistant

## 2020-09-22 ENCOUNTER — Ambulatory Visit (INDEPENDENT_AMBULATORY_CARE_PROVIDER_SITE_OTHER): Payer: Medicare HMO | Admitting: Physician Assistant

## 2020-09-22 ENCOUNTER — Telehealth: Payer: Self-pay

## 2020-09-22 ENCOUNTER — Other Ambulatory Visit: Payer: Self-pay

## 2020-09-22 DIAGNOSIS — F17218 Nicotine dependence, cigarettes, with other nicotine-induced disorders: Secondary | ICD-10-CM | POA: Diagnosis not present

## 2020-09-22 DIAGNOSIS — J439 Emphysema, unspecified: Secondary | ICD-10-CM | POA: Diagnosis not present

## 2020-09-22 DIAGNOSIS — I700 Atherosclerosis of aorta: Secondary | ICD-10-CM

## 2020-09-22 DIAGNOSIS — R69 Illness, unspecified: Secondary | ICD-10-CM | POA: Diagnosis not present

## 2020-09-22 DIAGNOSIS — I10 Essential (primary) hypertension: Secondary | ICD-10-CM | POA: Diagnosis not present

## 2020-09-22 MED ORDER — ALBUTEROL SULFATE HFA 108 (90 BASE) MCG/ACT IN AERS: 1.0000 | INHALATION_SPRAY | Freq: Four times a day (QID) | RESPIRATORY_TRACT | 3 refills | Status: DC | PRN

## 2020-09-22 MED ORDER — ROSUVASTATIN CALCIUM 5 MG PO TABS: 5.0000 mg | ORAL_TABLET | Freq: Every day | ORAL | 3 refills | Status: DC

## 2020-09-22 NOTE — Progress Notes (Signed)
[facility] [place], [place] [address]  Internal MEDICINE  Office Visit Note  Patient Name: Donald Scott  [id number]  [id number]  Date of Service: 09/23/2020  Chief Complaint  Patient presents with   Follow-up    Review results, discuss BP   Diabetes   Hypertension   Quality Metric Gaps    Shingrix, covid booster    HPI Pt is here for follow up on HTN and for CT lung results -BP cuff broke and insurance is sending him a new one, but unsure when he will receive it--therefore not checking BP at home currently.  Patient also admits that he does not think he will check his blood pressure very often when he does receive cuff.  Patient still working and admits that he thinks he will forget to check his blood pressure or not have time due to schedule.  Discussed setting reminders and the importance of checking blood pressure and getting it under control. Pt denies any headaches, dizziness, or blurred vision. -took his hyzaar, amlodipine and 25mg  hydralazine around 7:30 this AM -Goes to bed around 10pm, and up 4:30-5 for bathroom and then will lay back down and sometimes fall back asleep. Does not nap. Does not snore or startle awake gagging or choking.  -Smokes 1ppd, not willing to discuss cessation.  Did discuss that smoking can contribute to uncontrolled blood pressures -Did have CT lung cancer screening 09/17/20: IMPRESSION: 1. Lung-RADS 2, benign appearance or behavior. Continue annual screening with low-dose chest CT without contrast in 12 months. 2. Chronic calcific pancreatitis. 3. Aortic atherosclerosis (ICD10-I70.0). Coronary artery calcification. 4.  Emphysema (ICD10-J43.9).  Current Medication: Outpatient Encounter Medications as of 09/22/2020  Medication Sig Note   Alcohol Swabs PADS Use with cleaning area to test blood sugar twice a day    allopurinol (ZYLOPRIM) 100 MG tablet Take 1 tablet (100 mg total) by mouth daily.    amLODipine (NORVASC)  10 MG tablet Take 1 tablet (10 mg total) by mouth daily.    Blood Glucose Calibration (TRUE METRIX LEVEL 1) Low SOLN Use as directed to check blood sugar twice a day TRUE METRIX LEVEL 1 CTRL SOLN    fluticasone (FLONASE) 50 MCG/ACT nasal spray SHAKE LQ AND U 1 TO 2 SPRAYS IEN D FOR RHINITIS 05/22/2015: Received from: External Pharmacy   losartan-hydrochlorothiazide (HYZAAR) 100-25 MG tablet Take 1 tablet by mouth daily.    omeprazole (PRILOSEC) 40 MG capsule Take 1 capsule (40 mg total) by mouth daily.    rosuvastatin (CRESTOR) 5 MG tablet Take 1 tablet (5 mg total) by mouth daily.    TRUEplus Lancets 30G MISC Use to test blood sugar twice a day    [DISCONTINUED] albuterol (VENTOLIN HFA) 108 (90 Base) MCG/ACT inhaler Inhale 1 puff into the lungs every 6 (six) hours as needed for wheezing or shortness of breath.    [DISCONTINUED] hydrALAZINE (APRESOLINE) 25 MG tablet TAKE 1 TABLET(25 MG) BY MOUTH TWICE DAILY    albuterol (VENTOLIN HFA) 108 (90 Base) MCG/ACT inhaler Inhale 1 puff into the lungs every 6 (six) hours as needed for wheezing or shortness of breath.    No facility-administered encounter medications on file as of 09/22/2020.    Surgical History: Past Surgical History:  Procedure Laterality Date   CAROTID STENT  2010   Vascular bypass graft      Medical History: Past Medical History:  Diagnosis Date   Diabetes mellitus ([place]) 05/22/2015   Gout 05/22/2015   Hypertension  Family History: Family History  Problem Relation Age of Onset   Arthritis Mother     Social History   Socioeconomic History   Marital status: Married    Spouse name: Not on file   Number of children: Not on file   Years of education: Not on file   Highest education level: Not on file  Occupational History   Not on file  Tobacco Use   Smoking status: Every Day    Packs/day: 1.00    Years: 50.00    Pack years: 50.00    Types: Cigarettes   Smokeless tobacco: Never  Substance and Sexual Activity    Alcohol use: Yes    Alcohol/week: 18.0 standard drinks    Types: 18 Cans of beer per week   Drug use: No   Sexual activity: Not on file  Other Topics Concern   Not on file  Social History Narrative   Not on file   Social Determinants of Health   Financial Resource Strain: Not on file  Food Insecurity: Not on file  Transportation Needs: Not on file  Physical Activity: Not on file  Stress: Not on file  Social Connections: Not on file  Intimate Partner Violence: Not on file      Review of Systems  Constitutional:  Negative for chills, fatigue and unexpected weight change.  HENT:  Negative for congestion, postnasal drip, rhinorrhea, sneezing and sore throat.   Eyes:  Negative for redness.  Respiratory:  Negative for cough, chest tightness and shortness of breath.   Cardiovascular:  Negative for chest pain and palpitations.  Gastrointestinal:  Negative for abdominal pain, constipation, diarrhea, nausea and vomiting.  Genitourinary:  Negative for dysuria and frequency.  Musculoskeletal:  Negative for arthralgias, back pain, joint swelling and neck pain.  Skin:  Negative for rash.  Neurological: Negative.  Negative for tremors and numbness.  Hematological:  Negative for adenopathy. Does not bruise/bleed easily.  Psychiatric/Behavioral:  Negative for behavioral problems (Depression), sleep disturbance and suicidal ideas. The patient is not nervous/anxious.    Vital Signs: BP (!) 158/72 Comment: 184/84  Pulse 76   Temp (!) 96.9 F (36.1 C)   Resp 16   Ht 5\' 7"  (1.702 m)   Wt 152 lb 12.8 oz (69.3 kg)   SpO2 99%   BMI 23.93 kg/m    Physical Exam Vitals and nursing note reviewed.  Constitutional:      General: He is not in acute distress.    Appearance: He is well-developed and normal weight. He is not diaphoretic.  HENT:     Head: Normocephalic and atraumatic.     Mouth/Throat:     Pharynx: No oropharyngeal exudate.  Eyes:     Pupils: Pupils are equal, round, and  reactive to light.  Neck:     Thyroid: No thyromegaly.     Vascular: No JVD.     Trachea: No tracheal deviation.  Cardiovascular:     Rate and Rhythm: Normal rate and regular rhythm.     Heart sounds: Normal heart sounds. No murmur heard.   No friction rub. No gallop.  Pulmonary:     Effort: Pulmonary effort is normal. No respiratory distress.     Breath sounds: No wheezing or rales.  Chest:     Chest wall: No tenderness.  Abdominal:     General: Bowel sounds are normal.     Palpations: Abdomen is soft.  Musculoskeletal:        General: Normal range of motion.  Cervical back: Normal range of motion and neck supple.  Lymphadenopathy:     Cervical: No cervical adenopathy.  Skin:    General: Skin is warm and dry.  Neurological:     Mental Status: He is alert and oriented to person, place, and time.     Cranial Nerves: No cranial nerve deficit.  Psychiatric:        Behavior: Behavior normal.        Thought Content: Thought content normal.        Judgment: Judgment normal.       Assessment/Plan: 1. Uncontrolled hypertension Lengthy discussion with patient about need to get blood pressures under control.  Patient states he has been taking medications as prescribed but will confirm with pharmacy what medications have been filled.  Patient needs to monitor blood pressures at home given there may be some whitecoat syndrome increasing blood pressures in office.  Patient denies feeling symptomatic.  Also discussed limiting salt intake and discussed the impact that smoking can have on blood pressure.  We will continue current therapy for now until patient is able to monitor blood pressures at home however will consider increasing hydralazine or adding alternate medication if still not under control.  2. Aortic atherosclerosis (HCC) Will start on Crestor 5 mg daily - rosuvastatin (CRESTOR) 5 MG tablet; Take 1 tablet (5 mg total) by mouth daily.  Dispense: 90 tablet; Refill: 3  3.  Pulmonary emphysema, unspecified emphysema type ([place]) Seen on recent lung CT for cancer screening.  Patient requesting refill of his albuterol however states he does not normally need to use it.  Did discuss results of CT showing some pulmonary nodules that are likely benign and will need repeat in 1 year. - albuterol (VENTOLIN HFA) 108 (90 Base) MCG/ACT inhaler; Inhale 1 puff into the lungs every 6 (six) hours as needed for wheezing or shortness of breath.  Dispense: 1 each; Refill: 3  4. Cigarette nicotine dependence with other nicotine-induced disorder Patient unwilling to discuss cessation of smoking.  Did discuss that this can have a negative impact on blood pressure control in addition to multiple other consequences however patient is unwilling to change current habits.   General Counseling: Dagen verbalizes understanding of the findings of todays visit and agrees with plan of treatment. I have discussed any further diagnostic evaluation that may be needed or ordered today. We also reviewed his medications today. he has been encouraged to call the office with any questions or concerns that should arise related to todays visit.    No orders of the defined types were placed in this encounter.   Meds ordered this encounter  Medications   rosuvastatin (CRESTOR) 5 MG tablet    Sig: Take 1 tablet (5 mg total) by mouth daily.    Dispense:  90 tablet    Refill:  3   albuterol (VENTOLIN HFA) 108 (90 Base) MCG/ACT inhaler    Sig: Inhale 1 puff into the lungs every 6 (six) hours as needed for wheezing or shortness of breath.    Dispense:  1 each    Refill:  3    This patient was seen by Drema Dallas, PA-C in collaboration with Dr. Clayborn Bigness as a part of collaborative care agreement.   Total time spent:40 Minutes Time spent includes review of chart, medications, test results, and follow up plan with the patient.      Dr Lavera Guise Internal medicine

## 2020-09-22 NOTE — Telephone Encounter (Signed)
-----   Message from Mylinda Latina, PA-C sent at 09/22/2020  1:18 PM EDT ----- Can you please call his pharmacy and confirm that pt has been filling/picking up his BP meds as expected. He is supposed to be taking 25mg  hydralazine BID, Hyzaar 100-25, and amlodipine 10...trying to figure out if patient is compliant and actually taking all of these daily or if he has not been filling as expected.  Thanks!

## 2020-09-29 ENCOUNTER — Encounter: Payer: Self-pay | Admitting: *Deleted

## 2020-09-30 LAB — COLOGUARD

## 2020-10-02 DIAGNOSIS — I739 Peripheral vascular disease, unspecified: Secondary | ICD-10-CM | POA: Diagnosis not present

## 2020-10-02 DIAGNOSIS — E119 Type 2 diabetes mellitus without complications: Secondary | ICD-10-CM | POA: Diagnosis not present

## 2020-10-02 DIAGNOSIS — I10 Essential (primary) hypertension: Secondary | ICD-10-CM | POA: Diagnosis not present

## 2020-10-07 ENCOUNTER — Other Ambulatory Visit: Payer: Self-pay

## 2020-10-07 DIAGNOSIS — M109 Gout, unspecified: Secondary | ICD-10-CM

## 2020-10-07 MED ORDER — ALLOPURINOL 100 MG PO TABS: 100.0000 mg | ORAL_TABLET | Freq: Every day | ORAL | 1 refills | Status: DC

## 2020-10-21 ENCOUNTER — Telehealth: Payer: Self-pay | Admitting: Pharmacist

## 2020-10-21 NOTE — Progress Notes (Addendum)
Chronic Care Management Pharmacy Assistant   Name: Donald Scott  MRN: [id number] DOB: [birth year]  Reason for Encounter: Disease State For HTN.   Conditions to be addressed/monitored: HTN, GERD, DM.  Recent office visits:  09/22/20 Mylinda Latina, PA-C. For follow-up. STARTED Rosuvastatin 5 mg daily.   Recent consult visits:  09/17/20 Oncology (Video Visit) Borders, Kirt Boys, NP. For follow-up. No medication changes.  Hospital visits:  None since 09/09/20  Medications: Outpatient Encounter Medications as of 10/21/2020  Medication Sig Note   albuterol (VENTOLIN HFA) 108 (90 Base) MCG/ACT inhaler Inhale 1 puff into the lungs every 6 (six) hours as needed for wheezing or shortness of breath.    Alcohol Swabs PADS Use with cleaning area to test blood sugar twice a day    allopurinol (ZYLOPRIM) 100 MG tablet Take 1 tablet (100 mg total) by mouth daily.    amLODipine (NORVASC) 10 MG tablet Take 1 tablet (10 mg total) by mouth daily.    Blood Glucose Calibration (TRUE METRIX LEVEL 1) Low SOLN Use as directed to check blood sugar twice a day TRUE METRIX LEVEL 1 CTRL SOLN    fluticasone (FLONASE) 50 MCG/ACT nasal spray SHAKE LQ AND U 1 TO 2 SPRAYS IEN D FOR RHINITIS 05/22/2015: Received from: External Pharmacy   hydrALAZINE (APRESOLINE) 25 MG tablet TAKE 1 TABLET(25 MG) BY MOUTH TWICE DAILY    losartan-hydrochlorothiazide (HYZAAR) 100-25 MG tablet Take 1 tablet by mouth daily.    omeprazole (PRILOSEC) 40 MG capsule Take 1 capsule (40 mg total) by mouth daily.    rosuvastatin (CRESTOR) 5 MG tablet Take 1 tablet (5 mg total) by mouth daily.    TRUEplus Lancets 30G MISC Use to test blood sugar twice a day    No facility-administered encounter medications on file as of 10/21/2020.   Reviewed chart prior to disease state call. Spoke with patient regarding BP  Recent Office Vitals: BP Readings from Last 3 Encounters:  09/22/20 (!) 158/72  08/25/20 (!) 180/92  07/14/20 (!) 170/96    Pulse Readings from Last 3 Encounters:  09/22/20 76  08/25/20 72  07/14/20 69    Wt Readings from Last 3 Encounters:  09/22/20 152 lb 12.8 oz (69.3 kg)  09/17/20 152 lb (68.9 kg)  08/25/20 152 lb 9.6 oz (69.2 kg)     Kidney Function Lab Results  Component Value Date/Time   CREATININE 1.10 12/17/2019 09:10 AM   GFRNONAA 67 12/17/2019 09:10 AM   GFRAA 78 12/17/2019 09:10 AM    BMP Latest Ref Rng & Units 12/17/2019  Glucose 65 - 99 mg/dL 143(H)  BUN 8 - 27 mg/dL 16  Creatinine 0.76 - 1.27 mg/dL 1.10  BUN/Creat Ratio 10 - 24 15  Sodium 134 - 144 mmol/L 136  Potassium 3.5 - 5.2 mmol/L 3.9  Chloride 96 - 106 mmol/L 100  CO2 20 - 29 mmol/L 25  Calcium 8.6 - 10.2 mg/dL 9.0    Current antihypertensive regimen:  Amlodipine 10mg  daily - did not bring today unclear if he is taking this - AM Hydralazine 25mg  BID Losartan-HCTZ 100-25mg  daily - AM  How often are you checking your Blood Pressure? Patient stated he is still waiting for a blood pressure machine to come in the mail from [facility].  Current home BP readings: Patient stated he is still waiting for a blood pressure machine to come in the mail from [facility].  What recent interventions/DTPs have been made by any provider to improve Blood Pressure control  since last CPP Visit: None.  Any recent hospitalizations or ED visits since last visit with CPP? Patient stated none.  What diet changes have been made to improve Blood Pressure Control?  Patient stated he eats three meals a day. He stated he needs to eat a dentist so he can get his dentures ordered so he can eat more variety of foods.   What exercise is being done to improve your Blood Pressure Control?  Patient stated he is very active. 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   Adherence Review: Is the patient currently on ACE/ARB medication? Losartan-Hydrochlorothiazide 100-25 mg   Does the patient have >5 day gap between last estimated fill dates?   Star Rating Drugs:  Rosuvastatin 5 mg 90 DS 09/22/20, Losartan-Hydrochlorothiazide 100-25 mg 90 DS 09/20/20   Follow-Up:Pharmacist Review  Charlann Lange, RMA Clinical Pharmacist Assistant [phone]  6 minutes spent in review, coordination, and documentation.  Reviewed by: Beverly Milch, PharmD Clinical Pharmacist [phone]

## 2020-10-28 DIAGNOSIS — Z720 Tobacco use: Secondary | ICD-10-CM | POA: Diagnosis not present

## 2020-10-28 DIAGNOSIS — M109 Gout, unspecified: Secondary | ICD-10-CM | POA: Diagnosis not present

## 2020-10-28 DIAGNOSIS — I10 Essential (primary) hypertension: Secondary | ICD-10-CM | POA: Diagnosis not present

## 2020-10-28 DIAGNOSIS — Z8249 Family history of ischemic heart disease and other diseases of the circulatory system: Secondary | ICD-10-CM | POA: Diagnosis not present

## 2020-10-28 DIAGNOSIS — K219 Gastro-esophageal reflux disease without esophagitis: Secondary | ICD-10-CM | POA: Diagnosis not present

## 2020-10-28 DIAGNOSIS — H269 Unspecified cataract: Secondary | ICD-10-CM | POA: Diagnosis not present

## 2020-11-02 DIAGNOSIS — I739 Peripheral vascular disease, unspecified: Secondary | ICD-10-CM | POA: Diagnosis not present

## 2020-11-02 DIAGNOSIS — E119 Type 2 diabetes mellitus without complications: Secondary | ICD-10-CM | POA: Diagnosis not present

## 2020-11-02 DIAGNOSIS — I10 Essential (primary) hypertension: Secondary | ICD-10-CM | POA: Diagnosis not present

## 2020-11-03 ENCOUNTER — Encounter: Payer: Self-pay | Admitting: Physician Assistant

## 2020-11-03 ENCOUNTER — Ambulatory Visit (INDEPENDENT_AMBULATORY_CARE_PROVIDER_SITE_OTHER): Payer: Medicare HMO | Admitting: Physician Assistant

## 2020-11-03 ENCOUNTER — Encounter (INDEPENDENT_AMBULATORY_CARE_PROVIDER_SITE_OTHER): Payer: Self-pay

## 2020-11-03 ENCOUNTER — Other Ambulatory Visit: Payer: Self-pay

## 2020-11-03 DIAGNOSIS — I700 Atherosclerosis of aorta: Secondary | ICD-10-CM

## 2020-11-03 DIAGNOSIS — J310 Chronic rhinitis: Secondary | ICD-10-CM

## 2020-11-03 DIAGNOSIS — J439 Emphysema, unspecified: Secondary | ICD-10-CM | POA: Diagnosis not present

## 2020-11-03 DIAGNOSIS — F17218 Nicotine dependence, cigarettes, with other nicotine-induced disorders: Secondary | ICD-10-CM

## 2020-11-03 DIAGNOSIS — I10 Essential (primary) hypertension: Secondary | ICD-10-CM | POA: Diagnosis not present

## 2020-11-03 DIAGNOSIS — R69 Illness, unspecified: Secondary | ICD-10-CM | POA: Diagnosis not present

## 2020-11-03 MED ORDER — FLUTICASONE PROPIONATE 50 MCG/ACT NA SUSP: NASAL | 1 refills | Status: DC

## 2020-11-03 NOTE — Progress Notes (Signed)
[facility] [place], [place] [address]  Internal MEDICINE  Office Visit Note  Patient Name: Donald Scott  [id number]  [id number]  Date of Service: 11/09/2020  Chief Complaint  Patient presents with   Follow-up    refills   Diabetes   Hypertension    HPI Pt is here for routine follow up and has no complaints today -BP much better today. Changed from [employer] to [employer]--and they are sending bp cuff now. Has not been able to monitor BP at home, but is taking all of his medications.  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 Not interested in smoking cessation. -Works 6-7 hours a day, 7 days a week. States he is able to keep up with this schedule and sleeps well at night. States he knows his limit and it keeps him active.  Current Medication: Outpatient Encounter Medications as of 11/03/2020  Medication Sig Note   albuterol (VENTOLIN HFA) 108 (90 Base) MCG/ACT inhaler Inhale 1 puff into the lungs every 6 (six) hours as needed for wheezing or shortness of breath.    Alcohol Swabs PADS Use with cleaning area to test blood sugar twice a day    allopurinol (ZYLOPRIM) 100 MG tablet Take 1 tablet (100 mg total) by mouth daily.    amLODipine (NORVASC) 10 MG tablet Take 1 tablet (10 mg total) by mouth daily.    Blood Glucose Calibration (TRUE METRIX LEVEL 1) Low SOLN Use as directed to check blood sugar twice a day TRUE METRIX LEVEL 1 CTRL SOLN    hydrALAZINE (APRESOLINE) 25 MG tablet TAKE 1 TABLET(25 MG) BY MOUTH TWICE DAILY    losartan-hydrochlorothiazide (HYZAAR) 100-25 MG tablet Take 1 tablet by mouth daily.    omeprazole (PRILOSEC) 40 MG capsule Take 1 capsule (40 mg total) by mouth daily.    rosuvastatin (CRESTOR) 5 MG tablet Take 1 tablet (5 mg total) by mouth daily.    TRUEplus Lancets 30G MISC Use to test blood sugar twice a day     [DISCONTINUED] fluticasone (FLONASE) 50 MCG/ACT nasal spray SHAKE LQ AND U 1 TO 2 SPRAYS IEN D FOR RHINITIS 05/22/2015: Received from: External Pharmacy   fluticasone (FLONASE) 50 MCG/ACT nasal spray SHAKE LQ AND U 1 TO 2 SPRAYS IEN D FOR RHINITIS    No facility-administered encounter medications on file as of 11/03/2020.    Surgical History: Past Surgical History:  Procedure Laterality Date   CAROTID STENT  2010   Vascular bypass graft      Medical History: Past Medical History:  Diagnosis Date   Diabetes mellitus ([place]) 05/22/2015   Gout 05/22/2015   Hypertension     Family History: Family History  Problem Relation Age of Onset   Arthritis Mother     Social History   Socioeconomic History   Marital status: Married    Spouse name: Not on file   Number of children: Not on file   Years of education: Not on file   Highest education level: Not on file  Occupational History   Not on file  Tobacco Use   Smoking status: Every Day    Packs/day: 1.00    Years: 50.00    Pack years: 50.00    Types: Cigarettes   Smokeless tobacco: Never  Substance and Sexual Activity   Alcohol use: Yes    Alcohol/week:  18.0 standard drinks    Types: 18 Cans of beer per week   Drug use: No   Sexual activity: Not on file  Other Topics Concern   Not on file  Social History Narrative   Not on file   Social Determinants of Health   Financial Resource Strain: Not on file  Food Insecurity: Not on file  Transportation Needs: Not on file  Physical Activity: Not on file  Stress: Not on file  Social Connections: Not on file  Intimate Partner Violence: Not on file      Review of Systems  Constitutional:  Negative for chills, fatigue and unexpected weight change.  HENT:  Positive for postnasal drip. Negative for congestion, rhinorrhea, sneezing and sore throat.   Eyes:  Negative for redness.  Respiratory:  Negative for cough, chest tightness and shortness of breath.   Cardiovascular:   Negative for chest pain and palpitations.  Gastrointestinal:  Negative for abdominal pain, constipation, diarrhea, nausea and vomiting.  Genitourinary:  Negative for dysuria and frequency.  Musculoskeletal:  Negative for arthralgias, back pain, joint swelling and neck pain.  Skin:  Negative for rash.  Neurological: Negative.  Negative for tremors and numbness.  Hematological:  Negative for adenopathy. Does not bruise/bleed easily.  Psychiatric/Behavioral:  Negative for behavioral problems (Depression), sleep disturbance and suicidal ideas. The patient is not nervous/anxious.    Vital Signs: BP 138/76   Pulse 90   Temp (!) 97.1 F (36.2 C)   Resp 16   Ht '5\' 7"'$  (1.702 m)   Wt 151 lb 9.6 oz (68.8 kg)   SpO2 99%   BMI 23.74 kg/m    Physical Exam Vitals and nursing note reviewed.  Constitutional:      General: He is not in acute distress.    Appearance: He is well-developed and normal weight. He is not diaphoretic.  HENT:     Head: Normocephalic and atraumatic.     Mouth/Throat:     Pharynx: No oropharyngeal exudate.  Eyes:     Pupils: Pupils are equal, round, and reactive to light.  Neck:     Thyroid: No thyromegaly.     Vascular: No JVD.     Trachea: No tracheal deviation.  Cardiovascular:     Rate and Rhythm: Normal rate and regular rhythm.     Heart sounds: Normal heart sounds. No murmur heard.   No friction rub. No gallop.  Pulmonary:     Effort: Pulmonary effort is normal. No respiratory distress.     Breath sounds: No wheezing or rales.  Chest:     Chest wall: No tenderness.  Abdominal:     General: Bowel sounds are normal.     Palpations: Abdomen is soft.  Musculoskeletal:        General: Normal range of motion.     Cervical back: Normal range of motion and neck supple.  Lymphadenopathy:     Cervical: No cervical adenopathy.  Skin:    General: Skin is warm and dry.  Neurological:     Mental Status: He is alert and oriented to person, place, and time.      Cranial Nerves: No cranial nerve deficit.  Psychiatric:        Behavior: Behavior normal.        Thought Content: Thought content normal.        Judgment: Judgment normal.       Assessment/Plan: 1. Essential hypertension Well controlled today, will need to monitor closely. Pt still waiting for  BP cuff to arrive  2. Aortic atherosclerosis ([place]) Continue crestor  3. Pulmonary emphysema, unspecified emphysema type ([place]) Stable, continue to monitor and repeat CT in 1 year to follow pulmonary nodules  4. Cigarette nicotine dependence with other nicotine-induced disorder Pt not willing to discuss cessation   5. Chronic rhinitis - fluticasone (FLONASE) 50 MCG/ACT nasal spray; SHAKE LQ AND U 1 TO 2 SPRAYS IEN D FOR RHINITIS  Dispense: 18.2 mL; Refill: 1   General Counseling: Jakie verbalizes understanding of the findings of todays visit and agrees with plan of treatment. I have discussed any further diagnostic evaluation that may be needed or ordered today. We also reviewed his medications today. he has been encouraged to call the office with any questions or concerns that should arise related to todays visit.    No orders of the defined types were placed in this encounter.   Meds ordered this encounter  Medications   fluticasone (FLONASE) 50 MCG/ACT nasal spray    Sig: SHAKE LQ AND U 1 TO 2 SPRAYS IEN D FOR RHINITIS    Dispense:  18.2 mL    Refill:  1    This patient was seen by Drema Dallas, PA-C in collaboration with Dr. Clayborn Bigness as a part of collaborative care agreement.   Total time spent:35 Minutes Time spent includes review of chart, medications, test results, and follow up plan with the patient.      Dr Lavera Guise Internal medicine

## 2020-12-02 ENCOUNTER — Other Ambulatory Visit: Payer: Self-pay

## 2020-12-02 DIAGNOSIS — I10 Essential (primary) hypertension: Secondary | ICD-10-CM

## 2020-12-04 ENCOUNTER — Other Ambulatory Visit: Payer: Self-pay

## 2020-12-04 DIAGNOSIS — I700 Atherosclerosis of aorta: Secondary | ICD-10-CM

## 2020-12-04 DIAGNOSIS — I10 Essential (primary) hypertension: Secondary | ICD-10-CM

## 2020-12-04 MED ORDER — ROSUVASTATIN CALCIUM 5 MG PO TABS: 5.0000 mg | ORAL_TABLET | Freq: Every day | ORAL | 3 refills | Status: DC

## 2020-12-04 MED ORDER — ROSUVASTATIN CALCIUM 5 MG PO TABS: 5.0000 mg | ORAL_TABLET | Freq: Every day | ORAL | 1 refills | Status: DC

## 2020-12-04 MED ORDER — LOSARTAN POTASSIUM-HCTZ 100-25 MG PO TABS: 1.0000 | ORAL_TABLET | Freq: Every day | ORAL | 1 refills | Status: DC

## 2020-12-11 NOTE — Patient Instructions (Addendum)
Visit Information   Goals Addressed             This Visit's Progress    Track and Manage My Blood Pressure-Hypertension   On track    Timeframe:  Long-Range Goal Priority:  High Start Date:  09/09/20                           Expected End Date: 03/11/21                      Follow Up Date 01/02/21   - check blood pressure 3 times per week - choose a place to take my blood pressure (home, clinic or office, retail store) - write blood pressure results in a log or diary    Why is this important?   You won't feel high blood pressure, but it can still hurt your blood vessels.  High blood pressure can cause heart or kidney problems. It can also cause a stroke.  Making lifestyle changes like losing a little weight or eating less salt will help.  Checking your blood pressure at home and at different times of the day can help to control blood pressure.  If the doctor prescribes medicine remember to take it the way the doctor ordered.  Call the office if you cannot afford the medicine or if there are questions about it.     Notes:        Patient Care Plan: General Pharmacy (Adult)     Problem Identified: HTN, DM, GERD   Priority: High  Onset Date: 09/09/2020     Long-Range Goal: Patient-Specific Goal   Start Date: 09/09/2020  Expected End Date: 03/11/2021  Recent Progress: On track  Priority: High  Note:   Current Barriers:  Unable to independently monitor therapeutic efficacy Unable to achieve control of BP   Pharmacist Clinical Goal(s):  Patient will achieve adherence to monitoring guidelines and medication adherence to achieve therapeutic efficacy achieve control of BP as evidenced by home monitoring adhere to prescribed medication regimen as evidenced by fill dates/pill box contact provider office for questions/concerns as evidenced notation of same in electronic health record through collaboration with PharmD and provider.   Interventions: 1:1 collaboration with  Mylinda Latina, PA-C regarding development and update of comprehensive plan of care as evidenced by provider attestation and co-signature Inter-disciplinary care team collaboration (see longitudinal plan of care) Comprehensive medication review performed; medication list updated in electronic medical record  Hypertension (BP goal <140/90) -Not ideally controlled -Current treatment: Amlodipine '10mg'$  daily  Hydralazine '25mg'$  BID Losartan-HCTZ 100-'25mg'$  daily - AM -Medications previously tried: bisoprolol, telmisartan  -Current home readings: not checking regularly -Current dietary habits: trying to cut back on salt, now using sea salt -Current exercise habits: active working in shop at the end of his driveway, walks back and forth multiple times per day. -Denies hypotensive/hypertensive symptoms -Educated on BP goals and benefits of medications for prevention of heart attack, stroke and kidney damage; Daily salt intake goal < 2300 mg; Importance of home blood pressure monitoring; Symptoms of hypotension and importance of maintaining adequate hydration; -Counseled to monitor BP at home 3 times per week one hour after taking medications, document, and provide log at future appointments -Recommended to continue current medication Counseled on importance of taking all 3 medications for BP daily. Asked to monitor and record BP diligently until he comes back to see Alyssa in a few week and bring logs  with him.  At that time we have two options for BP, we can either continue to increase hydralazine or could look to add something like spironolactone.     Update 12/12/20 Now taking all prescribed BP meds as directed. BP has improved tremendously based on last office visit. Reports "normal" at home. Reinforced importance of med adherence. Denies any dizziness. Due for recheck with NP soon.  Continue current meds for now - will continue to monitor. Diabetes (A1c goal  <6.5%) -Controlled -Current medications: None -Medications previously tried: metformin  -Current home glucose readings fasting glucose: not checking post prandial glucose: not checking -Denies hypoglycemic/hyperglycemic symptoms -Current meal patterns: limiting salt, does admit to one pepsi per day -Current exercise: see above -Educated on A1c and blood sugar goals; Complications of diabetes including kidney damage, retinal damage, and cardiovascular disease; Exercise goal of 150 minutes per week; -Counseled to check feet daily and get yearly eye exams -Recommended continue current management strategy and regular A1c checks  Update 12/12/20 Due for A1c recheck Currently well controlled Continue current management and routine screenings.  GERD (Goal: Minimize symptoms) -Controlled -Current treatment  Omeprazole '40mg'$  daily -Medications previously tried: none noted -Reports symptoms are controlled as long as he takes this medication -Verbalizes appropriate medication administration.  -Recommended to continue current medication Avoid trigger foods as much as possible.   Patient Goals/Self-Care Activities Patient will:  - take medications as prescribed focus on medication adherence by pill box check blood pressure 3 times weekly, document, and provide at future appointments engage in dietary modifications by limiting salt intake  Follow Up Plan: The care management team will reach out to the patient again over the next 90 days.           Patient verbalizes understanding of instructions provided today and agrees to view in [place].  Telephone follow up appointment with pharmacy team member scheduled for:  Edythe Clarity, [place]

## 2020-12-11 NOTE — Progress Notes (Signed)
Chronic Care Management Pharmacy Note  12/12/2020 Name:  Donald Scott MRN:  [id number] DOB:  September 24, 1950  Summary: PharmD follow up.  BP meds resolved - BP much improved at last OV.  No recs at thistime   Subjective: Donald Scott is an 70 y.o. year old male who is a primary patient of McDonough, Si Gaul, PA-C.  The CCM team was consulted for assistance with disease management and care coordination needs.    Engaged with patient face to face for follow up visit in response to provider referral for pharmacy case management and/or care coordination services.   Consent to Services:  The patient was given the following information about Chronic Care Management services today, agreed to services, and gave verbal consent: 1. CCM service includes personalized support from designated clinical staff supervised by the primary care provider, including individualized plan of care and coordination with other care providers 2. 24/7 contact phone numbers for assistance for urgent and routine care needs. 3. Service will only be billed when office clinical staff spend 20 minutes or more in a month to coordinate care. 4. Only one practitioner may furnish and bill the service in a calendar month. 5.The patient may stop CCM services at any time (effective at the end of the month) by phone call to the office staff. 6. The patient will be responsible for cost sharing (co-pay) of up to 20% of the service fee (after annual deductible is met). Patient agreed to services and consent obtained.  Patient Care Team: Carolynne Edouard as PCP - General (Physician Assistant) Edythe Clarity, [facility] as Pharmacist (Pharmacist)  Recent office visits:  08/25/20 Jonetta Osgood, NP. For follow-up. CHANGED/INCREASED Hydralazine 25 mg 2 times daily.  07/14/20 McDonough, Si Gaul, PA-C. For follow-up. STARTED Hydralazine 10 mg 2 times daily.  05/16/20 Luiz Ochoa, NP. For HTN. No medication changes.  05/02/20  Luiz Ochoa, NP. For HTN. INCREASED/CHANGED Losartan Potassium-HCTZ 100-25 mg 1 tablet daily. CHANGED Amlodipine Besylate 10 mg daily. STOPPED Azithromycin and Benzonatate. 04/15/20 Luiz Ochoa, NP. For Type 2 DM. STARTED Allopurinol 100 mg daily, Azithromycin 250 mg take one tablet by mouth daily for 14 days, Benzonatate 100 mg 2 times daily PRN.   Recent consult visits:  None in the last six months    Hospital visits:  None in previous 6 months    Medication History: Losartan-Hydrochlorothiazide 100-25 mg daily 90 DS 06/15/20   Objective:  Lab Results  Component Value Date   CREATININE 1.10 12/17/2019   BUN 16 12/17/2019   GFRNONAA 67 12/17/2019   GFRAA 78 12/17/2019   NA 136 12/17/2019   K 3.9 12/17/2019   CALCIUM 9.0 12/17/2019   CO2 25 12/17/2019   GLUCOSE 143 (H) 12/17/2019    Lab Results  Component Value Date/Time   HGBA1C 5.6 08/25/2020 09:05 AM   HGBA1C 5.7 (A) 07/14/2020 11:11 AM    Last diabetic Eye exam: No results found for: HMDIABEYEEXA  Last diabetic Foot exam: No results found for: HMDIABFOOTEX   Lab Results  Component Value Date   CHOL 152 12/17/2019   HDL 63 12/17/2019   LDLCALC 72 12/17/2019   TRIG 89 12/17/2019    Hepatic Function Latest Ref Rng & Units 12/17/2019 04/30/2014  Total Protein 6.0 - 8.5 g/dL 7.6 7.3  Albumin 3.7 - 4.7 g/dL 4.4 3.6  AST 0 - 40 IU/L 18 31  ALT 0 - 44 IU/L 18 41  Alk Phosphatase 44 - 121 IU/L  94 76  Total Bilirubin 0.0 - 1.2 mg/dL 0.6 0.8    Lab Results  Component Value Date/Time   TSH 1.900 12/17/2019 09:10 AM   FREET4 1.01 12/17/2019 09:10 AM    CBC Latest Ref Rng & Units 12/17/2019 04/30/2014  WBC 3.4 - 10.8 x10E3/uL 7.9 7.0  Hemoglobin 13.0 - 17.7 g/dL 13.8 13.0  Hematocrit 37.5 - 51.0 % 43.7 39.6(L)  Platelets 150 - 450 x10E3/uL 159 127(L)    No results found for: VD25OH  Clinical ASCVD: No  The 10-year ASCVD risk score (Arnett DK, et al., 2019) is: 52.2%   Values used to calculate the  score:     Age: 61 years     Sex: Male     Is Non-Hispanic African American: Yes     Diabetic: Yes     Tobacco smoker: Yes     Systolic Blood Pressure: 456 mmHg     Is BP treated: Yes     HDL Cholesterol: 63 mg/dL     Total Cholesterol: 152 mg/dL    Depression screen [facility] 2/9 11/03/2020 08/25/2020 07/14/2020  Decreased Interest 0 0 0  Down, Depressed, Hopeless 0 0 0  PHQ - 2 Score 0 0 0     Social History   Tobacco Use  Smoking Status Every Day   Packs/day: 1.00   Years: 50.00   Pack years: 50.00   Types: Cigarettes  Smokeless Tobacco Never   BP Readings from Last 3 Encounters:  11/03/20 138/76  09/22/20 (!) 158/72  08/25/20 (!) 180/92   Pulse Readings from Last 3 Encounters:  11/03/20 90  09/22/20 76  08/25/20 72   Wt Readings from Last 3 Encounters:  11/03/20 151 lb 9.6 oz (68.8 kg)  09/22/20 152 lb 12.8 oz (69.3 kg)  09/17/20 152 lb (68.9 kg)   BMI Readings from Last 3 Encounters:  11/03/20 23.74 kg/m  09/22/20 23.93 kg/m  09/17/20 23.81 kg/m    Assessment/Interventions: Review of patient past medical history, allergies, medications, health status, including review of consultants reports, laboratory and other test data, was performed as part of comprehensive evaluation and provision of chronic care management services.   SDOH:  (Social Determinants of Health) assessments and interventions performed: Yes   Financial Resource Strain: Not on file    SDOH Screenings   Alcohol Screen: Low Risk    Last Alcohol Screening Score (AUDIT): 5  Depression (PHQ2-9): Low Risk    PHQ-2 Score: 0  Financial Resource Strain: Not on file  Food Insecurity: Not on file  Housing: Not on file  Physical Activity: Not on file  Social Connections: Not on file  Stress: Not on file  Tobacco Use: High Risk   Smoking Tobacco Use: Every Day   Smokeless Tobacco Use: Never  Transportation Needs: Not on file    CCM Care Plan  Allergies  Allergen Reactions   Iodinated  Diagnostic Agents Other (See Comments)    Other Reaction: GI Upset   Other Hives, Other (See Comments) and Rash    Uncoded Allergy. Allergen: CT Dye Uncoded Allergy. Allergen: ISOVUE 300, Other Reaction: Itching (even w/prep)   Penicillin G Other (See Comments)   Penicillins     Medications Reviewed Today     Reviewed by Edythe Clarity, [facility] (Pharmacist) on 12/12/20 at 1355  Med List Status: <None>   Medication Order Taking? Sig Documenting Provider Last Dose Status Informant  albuterol (VENTOLIN HFA) 108 (90 Base) MCG/ACT inhaler [id number] Yes Inhale 1 puff into the  lungs every 6 (six) hours as needed for wheezing or shortness of breath. Mylinda Latina, PA-C Taking Active   Alcohol Swabs PADS [id number] Yes Use with cleaning area to test blood sugar twice a day Ronnell Freshwater, NP Taking Active   allopurinol (ZYLOPRIM) 100 MG tablet [id number] Yes Take 1 tablet (100 mg total) by mouth daily. McDonough, Si Gaul, PA-C Taking Active   amLODipine (NORVASC) 10 MG tablet [id number] Yes Take 1 tablet (10 mg total) by mouth daily. Luiz Ochoa, NP Taking Active   Blood Glucose Calibration (TRUE METRIX LEVEL 1) Low SOLN [id number] Yes Use as directed to check blood sugar twice a day TRUE METRIX LEVEL 1 CTRL SOLN Boscia, Heather E, NP Taking Active   fluticasone (FLONASE) 50 MCG/ACT nasal spray [id number] Yes SHAKE LQ AND U 1 TO 2 SPRAYS IEN D FOR RHINITIS McDonough, Lauren K, PA-C Taking Active   hydrALAZINE (APRESOLINE) 25 MG tablet [id number] Yes TAKE 1 TABLET(25 MG) BY MOUTH TWICE DAILY McDonough, Lauren K, PA-C Taking Active   losartan-hydrochlorothiazide (HYZAAR) 100-25 MG tablet [id number] Yes Take 1 tablet by mouth daily. McDonough, Si Gaul, PA-C Taking Active   omeprazole (PRILOSEC) 40 MG capsule [id number] Yes Take 1 capsule (40 mg total) by mouth daily. Ronnell Freshwater, NP Taking Active   rosuvastatin (CRESTOR) 5 MG tablet [id number] Yes Take 1 tablet (5 mg total) by mouth daily.  Mylinda Latina, PA-C Taking Active   TRUEplus Lancets 30G MISC [id number] Yes Use to test blood sugar twice a day Ronnell Freshwater, NP Taking Active             Patient Active Problem List   Diagnosis Date Noted   Generalized anxiety disorder 05/08/2019   Encounter for general adult medical examination with abnormal findings 12/19/2017   Acute recurrent pansinusitis 12/19/2017   Dental caries 12/19/2017   Primary osteoarthritis involving multiple joints 12/19/2017   Gastroesophageal reflux disease without esophagitis 12/19/2017   Peripheral vascular disease, unspecified ([place]) 04/20/2017   Other chronic pancreatitis ([place]) 04/20/2017   Nicotine dependence, cigarettes, uncomplicated 25/75/0518   Hypokalemia 04/20/2017   Diabetes mellitus ([place]) 05/22/2015   Essential hypertension 05/22/2015   Gout 05/22/2015   Nerve root disorder 10/21/2014   Arthritis of knee, degenerative 03/27/2014    Immunization History  Administered Date(s) Administered   DTaP 07/31/2015   Influenza Inj Mdck Quad Pf 01/16/2018, 12/18/2019   Influenza-Unspecified 04/21/2017   PFIZER(Purple Top)SARS-COV-2 Vaccination 07/20/2019, 08/10/2019, 04/28/2020   Pneumococcal Polysaccharide-23 03/13/2014    Conditions to be addressed/monitored:  HTN, GERD, DM,   Care Plan : General Pharmacy (Adult)  Updates made by Edythe Clarity, RPH since 12/12/2020 12:00 AM     Problem: HTN, DM, GERD   Priority: High  Onset Date: 09/09/2020     Long-Range Goal: Patient-Specific Goal   Start Date: 09/09/2020  Expected End Date: 03/11/2021  Recent Progress: On track  Priority: High  Note:   Current Barriers:  Unable to independently monitor therapeutic efficacy Unable to achieve control of BP   Pharmacist Clinical Goal(s):  Patient will achieve adherence to monitoring guidelines and medication adherence to achieve therapeutic efficacy achieve control of BP as evidenced by home monitoring adhere to prescribed  medication regimen as evidenced by fill dates/pill box contact provider office for questions/concerns as evidenced notation of same in electronic health record through collaboration with PharmD and provider.   Interventions: 1:1 collaboration with Mylinda Latina, PA-C regarding development and update of comprehensive plan of care  as evidenced by provider attestation and co-signature Inter-disciplinary care team collaboration (see longitudinal plan of care) Comprehensive medication review performed; medication list updated in electronic medical record  Hypertension (BP goal <140/90) -Not ideally controlled -Current treatment: Amlodipine 70m daily  Hydralazine 245mBID Losartan-HCTZ 100-2524maily - AM -Medications previously tried: bisoprolol, telmisartan  -Current home readings: not checking regularly -Current dietary habits: trying to cut back on salt, now using sea salt -Current exercise habits: active working in shop at the end of his driveway, walks back and forth multiple times per day. -Denies hypotensive/hypertensive symptoms -Educated on BP goals and benefits of medications for prevention of heart attack, stroke and kidney damage; Daily salt intake goal < 2300 mg; Importance of home blood pressure monitoring; Symptoms of hypotension and importance of maintaining adequate hydration; -Counseled to monitor BP at home 3 times per week one hour after taking medications, document, and provide log at future appointments -Recommended to continue current medication Counseled on importance of taking all 3 medications for BP daily. Asked to monitor and record BP diligently until he comes back to see Alyssa in a few week and bring logs with him.  At that time we have two options for BP, we can either continue to increase hydralazine or could look to add something like spironolactone.     Update 12/12/20 Now taking all prescribed BP meds as directed. BP has improved tremendously based  on last office visit. Reports "normal" at home. Reinforced importance of med adherence. Denies any dizziness. Due for recheck with NP soon.  Continue current meds for now - will continue to monitor. Diabetes (A1c goal <6.5%) -Controlled -Current medications: None -Medications previously tried: metformin  -Current home glucose readings fasting glucose: not checking post prandial glucose: not checking -Denies hypoglycemic/hyperglycemic symptoms -Current meal patterns: limiting salt, does admit to one pepsi per day -Current exercise: see above -Educated on A1c and blood sugar goals; Complications of diabetes including kidney damage, retinal damage, and cardiovascular disease; Exercise goal of 150 minutes per week; -Counseled to check feet daily and get yearly eye exams -Recommended continue current management strategy and regular A1c checks  Update 12/12/20 Due for A1c recheck Currently well controlled Continue current management and routine screenings.  GERD (Goal: Minimize symptoms) -Controlled -Current treatment  Omeprazole 54m104mily -Medications previously tried: none noted -Reports symptoms are controlled as long as he takes this medication -Verbalizes appropriate medication administration.  -Recommended to continue current medication Avoid trigger foods as much as possible.   Patient Goals/Self-Care Activities Patient will:  - take medications as prescribed focus on medication adherence by pill box check blood pressure 3 times weekly, document, and provide at future appointments engage in dietary modifications by limiting salt intake  Follow Up Plan: The care management team will reach out to the patient again over the next 90 days.            Medication Assistance: None required.  Patient affirms current coverage meets needs.  Compliance/Adherence/Medication fill history: Care Gaps: Shingrix Vaccine  Star-Rating Drugs: Losartan-Hydrochlorothiazide  100-25 mg daily 90 DS 06/15/20  Patient's preferred pharmacy is:  [id number] [id number]: [phone]: [phone] pill box? Yes Pt endorses 100% compliance  We discussed: Benefits of medication synchronization, packaging and delivery as well as enhanced pharmacist oversight with [employer]. Patient decided to: Continue current medication management strategy  Care Plan and Follow Up Patient Decision:  Patient agrees to Care Plan and Follow-up.  Plan: The care management team will reach out to the patient again over the next 90 days.  Beverly Milch, PharmD Clinical Pharmacist [facility] [phone]

## 2020-12-12 ENCOUNTER — Ambulatory Visit: Payer: Medicare HMO | Admitting: Pharmacist

## 2020-12-12 DIAGNOSIS — I10 Essential (primary) hypertension: Secondary | ICD-10-CM

## 2020-12-12 DIAGNOSIS — E1159 Type 2 diabetes mellitus with other circulatory complications: Secondary | ICD-10-CM

## 2020-12-15 ENCOUNTER — Ambulatory Visit (INDEPENDENT_AMBULATORY_CARE_PROVIDER_SITE_OTHER): Payer: Medicare HMO | Admitting: Physician Assistant

## 2020-12-15 ENCOUNTER — Encounter: Payer: Self-pay | Admitting: Physician Assistant

## 2020-12-15 ENCOUNTER — Encounter (INDEPENDENT_AMBULATORY_CARE_PROVIDER_SITE_OTHER): Payer: Self-pay

## 2020-12-15 ENCOUNTER — Other Ambulatory Visit: Payer: Self-pay

## 2020-12-15 DIAGNOSIS — R5383 Other fatigue: Secondary | ICD-10-CM | POA: Diagnosis not present

## 2020-12-15 DIAGNOSIS — R0989 Other specified symptoms and signs involving the circulatory and respiratory systems: Secondary | ICD-10-CM

## 2020-12-15 DIAGNOSIS — I10 Essential (primary) hypertension: Secondary | ICD-10-CM

## 2020-12-15 DIAGNOSIS — M109 Gout, unspecified: Secondary | ICD-10-CM

## 2020-12-15 DIAGNOSIS — E1159 Type 2 diabetes mellitus with other circulatory complications: Secondary | ICD-10-CM | POA: Diagnosis not present

## 2020-12-15 DIAGNOSIS — I700 Atherosclerosis of aorta: Secondary | ICD-10-CM

## 2020-12-15 DIAGNOSIS — Z23 Encounter for immunization: Secondary | ICD-10-CM | POA: Diagnosis not present

## 2020-12-15 LAB — POCT GLYCOSYLATED HEMOGLOBIN (HGB A1C): Hemoglobin A1C: 6 % — AB (ref 4.0–5.6)

## 2020-12-15 MED ORDER — ALLOPURINOL 100 MG PO TABS: 100.0000 mg | ORAL_TABLET | Freq: Every day | ORAL | 1 refills | Status: DC

## 2020-12-15 MED ORDER — HYDRALAZINE HCL 25 MG PO TABS: ORAL_TABLET | ORAL | 1 refills | Status: DC

## 2020-12-15 MED ORDER — ROSUVASTATIN CALCIUM 5 MG PO TABS: 5.0000 mg | ORAL_TABLET | Freq: Every day | ORAL | 1 refills | Status: DC

## 2020-12-15 NOTE — Progress Notes (Signed)
[facility] [place], [place] [address]  Internal MEDICINE  Office Visit Note  Patient Name: Donald Scott  [id number]  [id number]  Date of Service: 12/16/2020  Chief Complaint  Patient presents with   Follow-up   Diabetes   Hypertension    HPI Pt is here for routine follow up and has no complaints today -He has been checking BP at home and stays about the same as in office today, around [id number] systolic. The BP in office is improved on last 2 visits. -still smoking 1ppd and not interested in quitting -Did discuss obtaining carotid US and pt is agreeable to this -Due for routine fasting labs prior to CPE next visit  Current Medication: Outpatient Encounter Medications as of 12/15/2020  Medication Sig   albuterol (VENTOLIN HFA) 108 (90 Base) MCG/ACT inhaler Inhale 1 puff into the lungs every 6 (six) hours as needed for wheezing or shortness of breath.   Alcohol Swabs PADS Use with cleaning area to test blood sugar twice a day   amLODipine (NORVASC) 10 MG tablet Take 1 tablet (10 mg total) by mouth daily.   Blood Glucose Calibration (TRUE METRIX LEVEL 1) Low SOLN Use as directed to check blood sugar twice a day TRUE METRIX LEVEL 1 CTRL SOLN   fluticasone (FLONASE) 50 MCG/ACT nasal spray SHAKE LQ AND U 1 TO 2 SPRAYS IEN D FOR RHINITIS   losartan-hydrochlorothiazide (HYZAAR) 100-25 MG tablet Take 1 tablet by mouth daily.   omeprazole (PRILOSEC) 40 MG capsule Take 1 capsule (40 mg total) by mouth daily.   TRUEplus Lancets 30G MISC Use to test blood sugar twice a day   [DISCONTINUED] allopurinol (ZYLOPRIM) 100 MG tablet Take 1 tablet (100 mg total) by mouth daily.   [DISCONTINUED] hydrALAZINE (APRESOLINE) 25 MG tablet TAKE 1 TABLET(25 MG) BY MOUTH TWICE DAILY   [DISCONTINUED] rosuvastatin (CRESTOR) 5 MG tablet Take 1 tablet (5 mg total) by mouth daily.   allopurinol (ZYLOPRIM) 100 MG tablet Take 1 tablet (100 mg total) by mouth daily.   hydrALAZINE (APRESOLINE)  25 MG tablet TAKE 1 TABLET(25 MG) BY MOUTH TWICE DAILY   rosuvastatin (CRESTOR) 5 MG tablet Take 1 tablet (5 mg total) by mouth daily.   No facility-administered encounter medications on file as of 12/15/2020.    Surgical History: Past Surgical History:  Procedure Laterality Date   CAROTID STENT  2010   Vascular bypass graft      Medical History: Past Medical History:  Diagnosis Date   Diabetes mellitus ([place]) 05/22/2015   Gout 05/22/2015   Hypertension     Family History: Family History  Problem Relation Age of Onset   Arthritis Mother     Social History   Socioeconomic History   Marital status: Married    Spouse name: Not on file   Number of children: Not on file   Years of education: Not on file   Highest education level: Not on file  Occupational History   Not on file  Tobacco Use   Smoking status: Every Day    Packs/day: 1.00    Years: 50.00    Pack years: 50.00    Types: Cigarettes   Smokeless tobacco: Never  Substance and Sexual Activity   Alcohol use: Yes    Alcohol/week: 18.0 standard drinks    Types: 18 Cans of beer per week   Drug use: No   Sexual activity: Not on file  Other Topics Concern   Not on file  Social  History Narrative   Not on file   Social Determinants of Health   Financial Resource Strain: Not on file  Food Insecurity: Not on file  Transportation Needs: Not on file  Physical Activity: Not on file  Stress: Not on file  Social Connections: Not on file  Intimate Partner Violence: Not on file      Review of Systems  Constitutional:  Negative for chills, fatigue and unexpected weight change.  HENT:  Negative for congestion, postnasal drip, rhinorrhea, sneezing and sore throat.   Eyes:  Negative for redness.  Respiratory:  Negative for cough, chest tightness and shortness of breath.   Cardiovascular:  Negative for chest pain and palpitations.  Gastrointestinal:  Negative for abdominal pain, constipation, diarrhea, nausea and  vomiting.  Genitourinary:  Negative for dysuria and frequency.  Musculoskeletal:  Negative for arthralgias, back pain, joint swelling and neck pain.  Skin:  Negative for rash.  Neurological: Negative.  Negative for tremors and numbness.  Hematological:  Negative for adenopathy. Does not bruise/bleed easily.  Psychiatric/Behavioral:  Negative for behavioral problems (Depression), sleep disturbance and suicidal ideas. The patient is not nervous/anxious.    Vital Signs: BP 140/70   Pulse 74   Temp 97.8 F (36.6 C)   Resp 16   Ht '5\' 7"'$  (1.702 m)   Wt 154 lb (69.9 kg)   SpO2 99%   BMI 24.12 kg/m    Physical Exam Vitals and nursing note reviewed.  Constitutional:      General: He is not in acute distress.    Appearance: He is well-developed and normal weight. He is not diaphoretic.  HENT:     Head: Normocephalic and atraumatic.     Mouth/Throat:     Pharynx: No oropharyngeal exudate.  Eyes:     Pupils: Pupils are equal, round, and reactive to light.  Neck:     Thyroid: No thyromegaly.     Vascular: No JVD.     Trachea: No tracheal deviation.  Cardiovascular:     Rate and Rhythm: Normal rate and regular rhythm.     Heart sounds: Normal heart sounds. No murmur heard.   No friction rub. No gallop.  Pulmonary:     Effort: Pulmonary effort is normal. No respiratory distress.     Breath sounds: No wheezing or rales.  Chest:     Chest wall: No tenderness.  Abdominal:     General: Bowel sounds are normal.     Palpations: Abdomen is soft.  Musculoskeletal:        General: Normal range of motion.     Cervical back: Normal range of motion and neck supple.  Lymphadenopathy:     Cervical: No cervical adenopathy.  Skin:    General: Skin is warm and dry.  Neurological:     Mental Status: He is alert and oriented to person, place, and time.     Cranial Nerves: No cranial nerve deficit.  Psychiatric:        Behavior: Behavior normal.        Thought Content: Thought content  normal.        Judgment: Judgment normal.       Assessment/Plan: 1. Type 2 diabetes mellitus with other circulatory complication, without long-term current use of insulin (HCC) - POCT HgB A1C is 6.0 which is increased from 5.6 at last check however remains well controlled.  Patient will continue to improve diet and exercise - Comprehensive metabolic panel  2. Essential hypertension Improving, will continue current medications.  Patient does request refill of hydralazine.  Patient also encouraged to continue to check blood pressure at home - hydrALAZINE (APRESOLINE) 25 MG tablet; TAKE 1 TABLET(25 MG) BY MOUTH TWICE DAILY  Dispense: 180 tablet; Refill: 1  3. Acute gout of right foot, unspecified cause Requesting refill of allopurinol - allopurinol (ZYLOPRIM) 100 MG tablet; Take 1 tablet (100 mg total) by mouth daily.  Dispense: 90 tablet; Refill: 1  4. Bilateral carotid bruits Will order carotid ultrasound for further evaluation - US Carotid Duplex Bilateral; Future  5. Aortic atherosclerosis (HCC) Continue Crestor daily and will update labs - rosuvastatin (CRESTOR) 5 MG tablet; Take 1 tablet (5 mg total) by mouth daily.  Dispense: 100 tablet; Refill: 1 - Lipid Panel With LDL/HDL Ratio  6. Other fatigue - CBC w/Diff/Platelet - Comprehensive metabolic panel - TSH + free T4  7. Flu vaccine need - Flu Vaccine MDCK QUAD PF    General Counseling: Bhavya verbalizes understanding of the findings of todays visit and agrees with plan of treatment. I have discussed any further diagnostic evaluation that may be needed or ordered today. We also reviewed his medications today. he has been encouraged to call the office with any questions or concerns that should arise related to todays visit.    Orders Placed This Encounter  Procedures   US Carotid Duplex Bilateral   Flu Vaccine MDCK QUAD PF   CBC w/Diff/Platelet   Comprehensive metabolic panel   TSH + free T4   Lipid Panel With  LDL/HDL Ratio   POCT HgB A1C    Meds ordered this encounter  Medications   hydrALAZINE (APRESOLINE) 25 MG tablet    Sig: TAKE 1 TABLET(25 MG) BY MOUTH TWICE DAILY    Dispense:  180 tablet    Refill:  1    **Patient requests 90 days supply**   allopurinol (ZYLOPRIM) 100 MG tablet    Sig: Take 1 tablet (100 mg total) by mouth daily.    Dispense:  90 tablet    Refill:  1   rosuvastatin (CRESTOR) 5 MG tablet    Sig: Take 1 tablet (5 mg total) by mouth daily.    Dispense:  100 tablet    Refill:  1    This patient was seen by Drema Dallas, PA-C in collaboration with Dr. Clayborn Bigness as a part of collaborative care agreement.   Total time spent:35 Minutes Time spent includes review of chart, medications, test results, and follow up plan with the patient.      Dr Lavera Guise Internal medicine

## 2020-12-18 ENCOUNTER — Other Ambulatory Visit: Payer: Self-pay

## 2020-12-24 ENCOUNTER — Other Ambulatory Visit: Payer: Self-pay

## 2020-12-24 ENCOUNTER — Ambulatory Visit (INDEPENDENT_AMBULATORY_CARE_PROVIDER_SITE_OTHER): Payer: Medicare HMO | Admitting: Internal Medicine

## 2020-12-24 ENCOUNTER — Other Ambulatory Visit: Payer: Self-pay | Admitting: Nurse Practitioner

## 2020-12-24 DIAGNOSIS — R0602 Shortness of breath: Secondary | ICD-10-CM

## 2020-12-24 LAB — PULMONARY FUNCTION TEST

## 2020-12-25 ENCOUNTER — Ambulatory Visit (INDEPENDENT_AMBULATORY_CARE_PROVIDER_SITE_OTHER): Payer: Medicare HMO | Admitting: Physician Assistant

## 2020-12-25 ENCOUNTER — Encounter: Payer: Self-pay | Admitting: Physician Assistant

## 2020-12-25 DIAGNOSIS — J984 Other disorders of lung: Secondary | ICD-10-CM | POA: Diagnosis not present

## 2020-12-25 DIAGNOSIS — J439 Emphysema, unspecified: Secondary | ICD-10-CM

## 2020-12-25 DIAGNOSIS — I10 Essential (primary) hypertension: Secondary | ICD-10-CM | POA: Diagnosis not present

## 2020-12-25 DIAGNOSIS — M25552 Pain in left hip: Secondary | ICD-10-CM

## 2020-12-25 DIAGNOSIS — R69 Illness, unspecified: Secondary | ICD-10-CM | POA: Diagnosis not present

## 2020-12-25 DIAGNOSIS — F17218 Nicotine dependence, cigarettes, with other nicotine-induced disorders: Secondary | ICD-10-CM

## 2020-12-25 DIAGNOSIS — G8929 Other chronic pain: Secondary | ICD-10-CM | POA: Diagnosis not present

## 2020-12-25 MED ORDER — MELOXICAM 7.5 MG PO TABS: 7.5000 mg | ORAL_TABLET | Freq: Every day | ORAL | 0 refills | Status: DC

## 2020-12-25 NOTE — Progress Notes (Signed)
[facility] [place], [place] [address]  Pulmonary Sleep Medicine   Office Visit Note  Patient Name: Donald Scott DOB: [birth year] MRN [id number]  Date of Service: 12/25/2020  Complaints/HPI: pt is here for pulmonary follow up to review PFT results which have not formally been read yet but show reassuring numbers with an FEV1 of 2.68, 114% predicted. Patient does have reduced TLC but this si consistent with last year. Patient denies any SOB, wheezing, CP, or ankle swelling. He is sleeping well. Does state he has not had his BP pills yet this AM and will take them as soon as he gets home when he eats something. He has been doing well with BP much better controlled at last visit. Patient does continue to smoke and declines discussing cessation. Does mention he has been having more arthritic pain in left hip recently likely due to movement required at work. Would like antiinflammatory to help ease pain.  ROS  General: (-) fever, (-) chills, (-) night sweats, (-) weakness Skin: (-) rashes, (-) itching,. Eyes: (-) visual changes, (-) redness, (-) itching. Nose and Sinuses: (-) nasal stuffiness or itchiness, (-) postnasal drip, (-) nosebleeds, (-) sinus trouble. Mouth and Throat: (-) sore throat, (-) hoarseness. Neck: (-) swollen glands, (-) enlarged thyroid, (-) neck pain. Respiratory: - cough, (-) bloody sputum, - shortness of breath, - wheezing. Cardiovascular: - ankle swelling, (-) chest pain. Lymphatic: (-) lymph node enlargement. Neurologic: (-) numbness, (-) tingling. Psychiatric: (-) anxiety, (-) depression   Current Medication: Outpatient Encounter Medications as of 12/25/2020  Medication Sig   albuterol (VENTOLIN HFA) 108 (90 Base) MCG/ACT inhaler Inhale 1 puff into the lungs every 6 (six) hours as needed for wheezing or shortness of breath.   Alcohol Swabs PADS Use with cleaning area to test blood sugar twice a day   allopurinol (ZYLOPRIM) 100 MG  tablet Take 1 tablet (100 mg total) by mouth daily.   amLODipine (NORVASC) 10 MG tablet Take 1 tablet (10 mg total) by mouth daily.   Blood Glucose Calibration (TRUE METRIX LEVEL 1) Low SOLN Use as directed to check blood sugar twice a day TRUE METRIX LEVEL 1 CTRL SOLN   fluticasone (FLONASE) 50 MCG/ACT nasal spray SHAKE LQ AND U 1 TO 2 SPRAYS IEN D FOR RHINITIS   hydrALAZINE (APRESOLINE) 25 MG tablet TAKE 1 TABLET(25 MG) BY MOUTH TWICE DAILY   losartan-hydrochlorothiazide (HYZAAR) 100-25 MG tablet Take 1 tablet by mouth daily.   meloxicam (MOBIC) 7.5 MG tablet Take 1 tablet (7.5 mg total) by mouth daily.   omeprazole (PRILOSEC) 40 MG capsule Take 1 capsule (40 mg total) by mouth daily.   rosuvastatin (CRESTOR) 5 MG tablet Take 1 tablet (5 mg total) by mouth daily.   TRUEplus Lancets 30G MISC Use to test blood sugar twice a day   No facility-administered encounter medications on file as of 12/25/2020.    Surgical History: Past Surgical History:  Procedure Laterality Date   CAROTID STENT  2010   Vascular bypass graft      Medical History: Past Medical History:  Diagnosis Date   Diabetes mellitus ([place]) 05/22/2015   Gout 05/22/2015   Hypertension     Family History: Family History  Problem Relation Age of Onset   Arthritis Mother     Social History: Social History   Socioeconomic History   Marital status: Married    Spouse name: Not on file   Number of children: Not on file   Years  of education: Not on file   Highest education level: Not on file  Occupational History   Not on file  Tobacco Use   Smoking status: Every Day    Packs/day: 1.00    Years: 50.00    Pack years: 50.00    Types: Cigarettes   Smokeless tobacco: Never  Substance and Sexual Activity   Alcohol use: Yes    Alcohol/week: 18.0 standard drinks    Types: 18 Cans of beer per week   Drug use: No   Sexual activity: Not on file  Other Topics Concern   Not on file  Social History Narrative   Not on  file   Social Determinants of Health   Financial Resource Strain: Not on file  Food Insecurity: Not on file  Transportation Needs: Not on file  Physical Activity: Not on file  Stress: Not on file  Social Connections: Not on file  Intimate Partner Violence: Not on file    Vital Signs: Blood pressure (!) 160/86, pulse 71, temperature 97.8 F (36.6 C), resp. rate 16, height 5\' 7"  (1.702 m), weight 155 lb (70.3 kg), SpO2 99 %.  Examination: General Appearance: The patient is well-developed, well-nourished, and in no distress. Skin: Gross inspection of skin unremarkable. Head: normocephalic, no gross deformities. Eyes: no gross deformities noted. ENT: ears appear grossly normal no exudates. Neck: Supple. No thyromegaly. No LAD. Respiratory: Lungs clear to auscultation bilaterally. Cardiovascular: Normal S1 and S2 without murmur or rub. Extremities: No cyanosis. pulses are equal. Neurologic: Alert and oriented. No involuntary movements.  LABS: Recent Results (from the past 2160 hour(s))  POCT HgB A1C     Status: Abnormal   Collection Time: 12/15/20  9:49 AM  Result Value Ref Range   Hemoglobin A1C 6.0 (A) 4.0 - 5.6 %   HbA1c POC (<> result, manual entry)     HbA1c, POC (prediabetic range)     HbA1c, POC (controlled diabetic range)      Radiology: CT CHEST LUNG CANCER SCREENING LOW DOSE WO CONTRAST  Result Date: 09/17/2020 CLINICAL DATA:  Current smoker, 50 pack-year history. EXAM: CT CHEST WITHOUT CONTRAST LOW-DOSE FOR LUNG CANCER SCREENING TECHNIQUE: Multidetector CT imaging of the chest was performed following the standard protocol without IV contrast. COMPARISON:  None. FINDINGS: Cardiovascular: Atherosclerotic calcification of the aorta, aortic valve and coronary arteries. Heart size normal. No pericardial effusion. Mediastinum/Nodes: No pathologically enlarged mediastinal or axillary lymph nodes. Hilar regions are difficult to definitively evaluate without IV contrast.  Esophagus is grossly unremarkable. Lungs/Pleura: Centrilobular and paraseptal emphysema. Smoking related respiratory bronchiolitis. Pulmonary nodules measure 1.4 mm or less in size. No pleural fluid. Minimal debris in the airway. Upper Abdomen: Visualized portions of the liver, gallbladder, adrenal glands, kidneys and spleen are unremarkable. Chronic calcific pancreatitis. Visualized portions of the stomach and bowel are grossly unremarkable. Musculoskeletal: No worrisome lytic or sclerotic lesions. IMPRESSION: 1. Lung-RADS 2, benign appearance or behavior. Continue annual screening with low-dose chest CT without contrast in 12 months. 2. Chronic calcific pancreatitis. 3. Aortic atherosclerosis (ICD10-I70.0). Coronary artery calcification. 4.  Emphysema (ICD10-J43.9). Electronically Signed   By: Lorin Picket M.D.   On: 09/17/2020 16:44    No results found.  No results found.    Assessment and Plan: Patient Active Problem List   Diagnosis Date Noted   Generalized anxiety disorder 05/08/2019   Encounter for general adult medical examination with abnormal findings 12/19/2017   Acute recurrent pansinusitis 12/19/2017   Dental caries 12/19/2017   Primary osteoarthritis involving  multiple joints 12/19/2017   Gastroesophageal reflux disease without esophagitis 12/19/2017   Peripheral vascular disease, unspecified ([place]) 04/20/2017   Other chronic pancreatitis ([place]) 04/20/2017   Nicotine dependence, cigarettes, uncomplicated 78/24/2353   Hypokalemia 04/20/2017   Diabetes mellitus ([place]) 05/22/2015   Essential hypertension 05/22/2015   Gout 05/22/2015   Nerve root disorder 10/21/2014   Arthritis of knee, degenerative 03/27/2014    1. Restrictive lung disease PFT results reviewed and consistent with restrictive lung disease which is stable from prior PFT. FEV1 normal. Continue to monitor  2. Pulmonary emphysema, unspecified emphysema type ([place]) Seen on CT chest, pt denies any symptoms and  FEV1 normal. Will continue to monitor  3. Essential hypertension BP elevated bc pt has not had BP meds yet today. Had been doing much better with BP and will continue with current medications.  4. Cigarette nicotine dependence with other nicotine-induced disorder Continues to smoke, pt declines discussion on smoking cessation  5. Chronic left hip pain May try mobic for left hip pain, though risk of NSAIDs long term reviewed. May also try alternating with tylenol prn - meloxicam (MOBIC) 7.5 MG tablet; Take 1 tablet (7.5 mg total) by mouth daily.  Dispense: 30 tablet; Refill: 0   General Counseling: I have discussed the findings of the evaluation and examination with [facility].  I have also discussed any further diagnostic evaluation thatmay be needed or ordered today. Parris verbalizes understanding of the findings of todays visit. We also reviewed his medications today and discussed drug interactions and side effects including but not limited excessive drowsiness and altered mental states. We also discussed that there is always a risk not just to him but also people around him. he has been encouraged to call the office with any questions or concerns that should arise related to todays visit.  No orders of the defined types were placed in this encounter.    Time spent: 30  I have personally obtained a history, examined the patient, evaluated laboratory and imaging results, formulated the assessment and plan and placed orders. This patient was seen by Drema Dallas, PA-C in collaboration with Dr. Devona Konig as a part of collaborative care agreement.     Allyne Gee, MD [facility] Pulmonary and Critical Care Sleep medicine

## 2020-12-25 NOTE — Patient Instructions (Signed)

## 2020-12-31 ENCOUNTER — Other Ambulatory Visit: Payer: Self-pay

## 2020-12-31 ENCOUNTER — Ambulatory Visit (INDEPENDENT_AMBULATORY_CARE_PROVIDER_SITE_OTHER): Payer: Medicare HMO

## 2020-12-31 ENCOUNTER — Encounter (INDEPENDENT_AMBULATORY_CARE_PROVIDER_SITE_OTHER): Payer: Self-pay

## 2020-12-31 DIAGNOSIS — R0989 Other specified symptoms and signs involving the circulatory and respiratory systems: Secondary | ICD-10-CM

## 2021-01-02 DIAGNOSIS — I10 Essential (primary) hypertension: Secondary | ICD-10-CM | POA: Diagnosis not present

## 2021-01-02 DIAGNOSIS — I739 Peripheral vascular disease, unspecified: Secondary | ICD-10-CM | POA: Diagnosis not present

## 2021-01-02 DIAGNOSIS — E119 Type 2 diabetes mellitus without complications: Secondary | ICD-10-CM | POA: Diagnosis not present

## 2021-01-04 NOTE — Procedures (Signed)
[facility] [place], [id number]    Complete Pulmonary Function Testing Interpretation:  FINDINGS:  The forced vital capacity is normal FEV1 is normal.  FEV1 FVC ratio is normal.  Total lung capacity is reduced moderately residual volume is decreased residual internal capacity ratio is decreased.  FRC is decreased.  DLCO is within normal limits.  Postbronchodilator no significant change in FEV1 clinical improvement may occur in the absence of spirometric improvement.  IMPRESSION:  This pulmonary function study is suggestive of moderate restrictive lung disease clinical correlation is recommended.  Allyne Gee, MD [facility] Pulmonary Critical Care Medicine Sleep Medicine

## 2021-01-04 NOTE — Procedures (Signed)
[place], [place] [address]  DATE OF SERVICE: December 31, 2020  CAROTID DOPPLER INTERPRETATION:  Bilateral Carotid Ultrsasound and Color Doppler Examination was performed. The RIGHT CCA shows insignificant plaque in the vessel. The LEFT CCA shows insignificant plaque in the vessel. There was no intimal thickening noted in the RIGHT carotid artery. There was no intimal thickening in the LEFT carotid artery.  The RIGHT CCA shows peak systolic velocity of 59 cm per second. The end diastolic velocity is 6 cm per second on the RIGHT side. The RIGHT ICA shows peak systolic velocity of 62 per second. RIGHT sided ICA end diastolic velocity is 17 cm per second. The RIGHT ECA shows a peak systolic velocity of 69 cm per second. The ICA/CCA ratio is calculated to be 1.05. This suggests less than 50% stenosis. The Vertebral Artery shows antegrade flow.  The LEFT CCA shows peak systolic velocity of 89 cm per second. The end diastolic velocity is 13 cm per second on the LEFT side. The LEFT ICA shows peak systolic velocity of 67 per second. LEFT sided ICA end diastolic velocity is 20 cm per second. The LEFT ECA shows a peak systolic velocity of 450 cm per second. The ICA/CCA ratio is calculated to be 0.75. This suggests less than 50% stenosis. The Vertebral Artery shows antegrade flow.   Impression:    The RIGHT CAROTID shows less than 50% stenosis. The LEFT CAROTID shows less than 50% stenosis.  There is insignificant plaque formation noted on the LEFT and insignificant on the RIGHT  side. Consider a repeat Carotid doppler if clinical situation and symptoms warrant in 6-12 months. Patient should be encouraged to change lifestyles such as smoking cessation, regular exercise and dietary modification. Use of statins in the right clinical setting and ASA is encouraged.  Allyne Gee, MD [facility] Pulmonary Critical Care Medicine

## 2021-01-30 ENCOUNTER — Ambulatory Visit: Payer: Medicare HMO | Admitting: Nurse Practitioner

## 2021-02-02 ENCOUNTER — Ambulatory Visit: Payer: Medicare HMO | Admitting: Nurse Practitioner

## 2021-02-05 ENCOUNTER — Other Ambulatory Visit: Payer: Self-pay

## 2021-02-05 ENCOUNTER — Ambulatory Visit: Payer: Medicare HMO | Admitting: Physician Assistant

## 2021-02-05 ENCOUNTER — Encounter (INDEPENDENT_AMBULATORY_CARE_PROVIDER_SITE_OTHER): Payer: Self-pay

## 2021-02-10 ENCOUNTER — Telehealth: Payer: Self-pay | Admitting: Student-PharmD

## 2021-02-10 NOTE — Progress Notes (Addendum)
Chronic Care Management Pharmacy Assistant   Name: Donald Scott  MRN: [id number] DOB: 10-Apr-1950  Reason for Encounter: Disease State For HTN.   Conditions to be addressed/monitored: HTN, GERD, DM.  Recent office visits:  12/25/20 Mylinda Latina, PA-C. For follow-up. STARTED Meloxicam 7.5 mg daily.  12/15/20 McDonough, Si Gaul, PA-C. For follow-up. No medication changes.  Recent consult visits:  None since last pharm D visit on 12/12/20  Hospital visits:  None since last pharm D visit on 12/12/20  Medications: Outpatient Encounter Medications as of 02/10/2021  Medication Sig   albuterol (VENTOLIN HFA) 108 (90 Base) MCG/ACT inhaler Inhale 1 puff into the lungs every 6 (six) hours as needed for wheezing or shortness of breath.   Alcohol Swabs PADS Use with cleaning area to test blood sugar twice a day   allopurinol (ZYLOPRIM) 100 MG tablet Take 1 tablet (100 mg total) by mouth daily.   amLODipine (NORVASC) 10 MG tablet Take 1 tablet (10 mg total) by mouth daily.   Blood Glucose Calibration (TRUE METRIX LEVEL 1) Low SOLN Use as directed to check blood sugar twice a day TRUE METRIX LEVEL 1 CTRL SOLN   fluticasone (FLONASE) 50 MCG/ACT nasal spray SHAKE LQ AND U 1 TO 2 SPRAYS IEN D FOR RHINITIS   hydrALAZINE (APRESOLINE) 25 MG tablet TAKE 1 TABLET(25 MG) BY MOUTH TWICE DAILY   losartan-hydrochlorothiazide (HYZAAR) 100-25 MG tablet Take 1 tablet by mouth daily.   meloxicam (MOBIC) 7.5 MG tablet Take 1 tablet (7.5 mg total) by mouth daily.   omeprazole (PRILOSEC) 40 MG capsule Take 1 capsule (40 mg total) by mouth daily.   rosuvastatin (CRESTOR) 5 MG tablet Take 1 tablet (5 mg total) by mouth daily.   TRUEplus Lancets 30G MISC Use to test blood sugar twice a day   No facility-administered encounter medications on file as of 02/10/2021.   Reviewed chart prior to disease state call. Spoke with patient regarding BP  Recent Office Vitals: BP Readings from Last 3 Encounters:   12/25/20 (!) 160/86  12/15/20 140/70  11/03/20 138/76   Pulse Readings from Last 3 Encounters:  12/25/20 71  12/15/20 74  11/03/20 90    Wt Readings from Last 3 Encounters:  12/25/20 155 lb (70.3 kg)  12/15/20 154 lb (69.9 kg)  11/03/20 151 lb 9.6 oz (68.8 kg)     Kidney Function Lab Results  Component Value Date/Time   CREATININE 1.10 12/17/2019 09:10 AM   GFRNONAA 67 12/17/2019 09:10 AM   GFRAA 78 12/17/2019 09:10 AM    BMP Latest Ref Rng & Units 12/17/2019  Glucose 65 - 99 mg/dL 143(H)  BUN 8 - 27 mg/dL 16  Creatinine 0.76 - 1.27 mg/dL 1.10  BUN/Creat Ratio 10 - 24 15  Sodium 134 - 144 mmol/L 136  Potassium 3.5 - 5.2 mmol/L 3.9  Chloride 96 - 106 mmol/L 100  CO2 20 - 29 mmol/L 25  Calcium 8.6 - 10.2 mg/dL 9.0    Current antihypertensive regimen:  Amlodipine 10mg  daily  Hydralazine 25mg  BID Losartan-HCTZ 100-25mg  daily - AM  How often are you checking your Blood Pressure? Patient stated when feeling symptomatic  Current home BP readings: Patient was out and about and was unable to give me any blood pressure readings. He stated he would try to write some of his blood pressures down when he does check to give to me later.   What recent interventions/DTPs have been made by any provider to improve Blood Pressure  control since last CPP Visit: None.  Any recent hospitalizations or ED visits since last visit with CPP? Patient stated no.  What diet changes have been made to improve Blood Pressure Control?  Patient stated he drinks plenty of water. Patient stated he eats a well rounded diet and eats three times a day.  What exercise is being done to improve your Blood Pressure Control?  Patient stated he is busy daily. He works daily being a Dealer.   Adherence Review: Is the patient currently on ACE/ARB medication? Losartan-Hydrochlorothiazide 100-25 mg   Does the patient have >5 day gap between last estimated fill dates? Per misc rpts, no.   Care  Gaps:N/A.  Star Rating Drugs: Rosuvastatin 5 mg 12/04/20 90 DS, Losartan-Hydrochlorothiazide 100-25 mg 12/04/20 100 DS.   Follow-Up:Pharmacist Review  Charlann Lange, RMA Clinical Pharmacist Assistant [phone]  5 minutes spent in review, coordination, and documentation.  Reviewed by: Alena Bills, PharmD Clinical Pharmacist [phone]

## 2021-03-04 DIAGNOSIS — I10 Essential (primary) hypertension: Secondary | ICD-10-CM

## 2021-03-04 DIAGNOSIS — I739 Peripheral vascular disease, unspecified: Secondary | ICD-10-CM

## 2021-03-04 DIAGNOSIS — E119 Type 2 diabetes mellitus without complications: Secondary | ICD-10-CM

## 2021-03-19 ENCOUNTER — Encounter: Payer: Self-pay | Admitting: Physician Assistant

## 2021-03-19 ENCOUNTER — Ambulatory Visit (INDEPENDENT_AMBULATORY_CARE_PROVIDER_SITE_OTHER): Payer: Medicare HMO | Admitting: Physician Assistant

## 2021-03-19 ENCOUNTER — Other Ambulatory Visit: Payer: Self-pay

## 2021-03-19 VITALS — BP 150/90 | HR 74 | Temp 97.8°F | Resp 16 | Ht 67.0 in | Wt 162.0 lb

## 2021-03-19 DIAGNOSIS — I700 Atherosclerosis of aorta: Secondary | ICD-10-CM

## 2021-03-19 DIAGNOSIS — M25552 Pain in left hip: Secondary | ICD-10-CM

## 2021-03-19 DIAGNOSIS — M25551 Pain in right hip: Secondary | ICD-10-CM

## 2021-03-19 DIAGNOSIS — I10 Essential (primary) hypertension: Secondary | ICD-10-CM | POA: Diagnosis not present

## 2021-03-19 DIAGNOSIS — G8929 Other chronic pain: Secondary | ICD-10-CM

## 2021-03-19 DIAGNOSIS — E1159 Type 2 diabetes mellitus with other circulatory complications: Secondary | ICD-10-CM | POA: Diagnosis not present

## 2021-03-19 LAB — POCT GLYCOSYLATED HEMOGLOBIN (HGB A1C): Hemoglobin A1C: 5.8 % — AB (ref 4.0–5.6)

## 2021-03-19 NOTE — Progress Notes (Signed)
[facility] [place], [place] [address]  Internal MEDICINE  Office Visit Note  Patient Name: Donald Scott  [id number]  [id number]  Date of Service: 03/30/2021  Chief Complaint  Patient presents with   Follow-up    Discuss BP and U/S   Diabetes   Hypertension   Results    HPI Pt is here for routine follow up. He had missed CPE last visit and walked in today and was able to be seen. Has not had labs done -Reviewed carotid US showing <50% stenosis bilaterally with no significant plaque formation -Arthritis more problematic now. Left shoulder and some hip bothersome -He still does Dealer work and walks a lot. Unsure if he has tried to [employer] sent previously for arthritis -wants meds refilled through [facility] -He has not taken his BP meds yet today which is why it is elevated. Has been well controlled at home   Current Medication: Outpatient Encounter Medications as of 03/19/2021  Medication Sig   albuterol (VENTOLIN HFA) 108 (90 Base) MCG/ACT inhaler Inhale 1 puff into the lungs every 6 (six) hours as needed for wheezing or shortness of breath.   Alcohol Swabs PADS Use with cleaning area to test blood sugar twice a day   allopurinol (ZYLOPRIM) 100 MG tablet Take 1 tablet (100 mg total) by mouth daily.   amLODipine (NORVASC) 10 MG tablet Take 1 tablet (10 mg total) by mouth daily.   Blood Glucose Calibration (TRUE METRIX LEVEL 1) Low SOLN Use as directed to check blood sugar twice a day TRUE METRIX LEVEL 1 CTRL SOLN   fluticasone (FLONASE) 50 MCG/ACT nasal spray SHAKE LQ AND U 1 TO 2 SPRAYS IEN D FOR RHINITIS   hydrALAZINE (APRESOLINE) 25 MG tablet TAKE 1 TABLET(25 MG) BY MOUTH TWICE DAILY   losartan-hydrochlorothiazide (HYZAAR) 100-25 MG tablet Take 1 tablet by mouth daily.   meloxicam (MOBIC) 7.5 MG tablet Take 1 tablet (7.5 mg total) by mouth daily.   omeprazole (PRILOSEC) 40 MG capsule Take 1 capsule (40 mg total) by mouth daily.    rosuvastatin (CRESTOR) 5 MG tablet Take 1 tablet (5 mg total) by mouth daily.   TRUEplus Lancets 30G MISC Use to test blood sugar twice a day   No facility-administered encounter medications on file as of 03/19/2021.    Surgical History: Past Surgical History:  Procedure Laterality Date   CAROTID STENT  2010   Vascular bypass graft      Medical History: Past Medical History:  Diagnosis Date   Diabetes mellitus ([place]) 05/22/2015   Gout 05/22/2015   Hypertension     Family History: Family History  Problem Relation Age of Onset   Arthritis Mother     Social History   Socioeconomic History   Marital status: Married    Spouse name: Not on file   Number of children: Not on file   Years of education: Not on file   Highest education level: Not on file  Occupational History   Not on file  Tobacco Use   Smoking status: Every Day    Packs/day: 1.00    Years: 50.00    Pack years: 50.00    Types: Cigarettes   Smokeless tobacco: Never  Substance and Sexual Activity   Alcohol use: Yes    Alcohol/week: 18.0 standard drinks    Types: 18 Cans of beer per week   Drug use: No   Sexual activity: Not on file  Other Topics Concern  Not on file  Social History Narrative   Not on file   Social Determinants of Health   Financial Resource Strain: Not on file  Food Insecurity: Not on file  Transportation Needs: Not on file  Physical Activity: Not on file  Stress: Not on file  Social Connections: Not on file  Intimate Partner Violence: Not on file      Review of Systems  Constitutional:  Negative for chills, fatigue and unexpected weight change.  HENT:  Negative for congestion, postnasal drip, rhinorrhea, sneezing and sore throat.   Eyes:  Negative for redness.  Respiratory:  Negative for cough, chest tightness and shortness of breath.   Cardiovascular:  Negative for chest pain and palpitations.  Gastrointestinal:  Negative for abdominal pain, constipation, diarrhea, nausea  and vomiting.  Genitourinary:  Negative for dysuria and frequency.  Musculoskeletal:  Positive for arthralgias. Negative for back pain, joint swelling and neck pain.  Skin:  Negative for rash.  Neurological: Negative.  Negative for tremors and numbness.  Hematological:  Negative for adenopathy. Does not bruise/bleed easily.  Psychiatric/Behavioral:  Negative for behavioral problems (Depression), sleep disturbance and suicidal ideas. The patient is not nervous/anxious.    Vital Signs: BP (!) 150/90    Pulse 74    Temp 97.8 F (36.6 C)    Resp 16    Ht 5\' 7"  (1.702 m)    Wt 162 lb (73.5 kg)    SpO2 98%    BMI 25.37 kg/m    Physical Exam Vitals and nursing note reviewed.  Constitutional:      General: He is not in acute distress.    Appearance: He is well-developed and normal weight. He is not diaphoretic.  HENT:     Head: Normocephalic and atraumatic.     Mouth/Throat:     Pharynx: No oropharyngeal exudate.  Eyes:     Pupils: Pupils are equal, round, and reactive to light.  Neck:     Thyroid: No thyromegaly.     Vascular: No JVD.     Trachea: No tracheal deviation.  Cardiovascular:     Rate and Rhythm: Normal rate and regular rhythm.     Heart sounds: Normal heart sounds. No murmur heard.   No friction rub. No gallop.  Pulmonary:     Effort: Pulmonary effort is normal. No respiratory distress.     Breath sounds: No wheezing or rales.  Chest:     Chest wall: No tenderness.  Abdominal:     General: Bowel sounds are normal.     Palpations: Abdomen is soft.  Musculoskeletal:        General: Normal range of motion.     Cervical back: Normal range of motion and neck supple.  Lymphadenopathy:     Cervical: No cervical adenopathy.  Skin:    General: Skin is warm and dry.  Neurological:     Mental Status: He is alert and oriented to person, place, and time.     Cranial Nerves: No cranial nerve deficit.  Psychiatric:        Behavior: Behavior normal.        Thought Content:  Thought content normal.        Judgment: Judgment normal.       Assessment/Plan: 1. Type 2 diabetes mellitus with other circulatory complication, without long-term current use of insulin (HCC) - POCT HgB A1C is 5.8 which is improved from 6.0 last check, Will continue to control with diet and exercise  2. Essential hypertension  Has not taken BP meds yet and will do so now, continue to monitor  3. Aortic atherosclerosis ([place]) Continue crestor  4. Chronic pain of both hips May take mobic as needed   General Counseling: Arthor verbalizes understanding of the findings of todays visit and agrees with plan of treatment. I have discussed any further diagnostic evaluation that may be needed or ordered today. We also reviewed his medications today. he has been encouraged to call the office with any questions or concerns that should arise related to todays visit.    Orders Placed This Encounter  Procedures   POCT HgB A1C    No orders of the defined types were placed in this encounter.   This patient was seen by Drema Dallas, PA-C in collaboration with Dr. Clayborn Bigness as a part of collaborative care agreement.   Total time spent:30 Minutes Time spent includes review of chart, medications, test results, and follow up plan with the patient.      Dr Lavera Guise Internal medicine

## 2021-04-17 ENCOUNTER — Encounter: Payer: Self-pay | Admitting: Physician Assistant

## 2021-04-17 ENCOUNTER — Ambulatory Visit (INDEPENDENT_AMBULATORY_CARE_PROVIDER_SITE_OTHER): Payer: Medicare HMO | Admitting: Physician Assistant

## 2021-04-17 ENCOUNTER — Other Ambulatory Visit: Payer: Self-pay

## 2021-04-17 DIAGNOSIS — F17218 Nicotine dependence, cigarettes, with other nicotine-induced disorders: Secondary | ICD-10-CM

## 2021-04-17 DIAGNOSIS — R5383 Other fatigue: Secondary | ICD-10-CM

## 2021-04-17 DIAGNOSIS — M25552 Pain in left hip: Secondary | ICD-10-CM

## 2021-04-17 DIAGNOSIS — I10 Essential (primary) hypertension: Secondary | ICD-10-CM

## 2021-04-17 DIAGNOSIS — Z0001 Encounter for general adult medical examination with abnormal findings: Secondary | ICD-10-CM

## 2021-04-17 DIAGNOSIS — R300 Dysuria: Secondary | ICD-10-CM

## 2021-04-17 DIAGNOSIS — I700 Atherosclerosis of aorta: Secondary | ICD-10-CM | POA: Diagnosis not present

## 2021-04-17 DIAGNOSIS — G8929 Other chronic pain: Secondary | ICD-10-CM

## 2021-04-17 DIAGNOSIS — R7989 Other specified abnormal findings of blood chemistry: Secondary | ICD-10-CM

## 2021-04-17 MED ORDER — MELOXICAM 7.5 MG PO TABS: 7.5000 mg | ORAL_TABLET | Freq: Every day | ORAL | 2 refills | Status: DC

## 2021-04-17 MED ORDER — HYDRALAZINE HCL 25 MG PO TABS: 25.0000 mg | ORAL_TABLET | Freq: Three times a day (TID) | ORAL | 2 refills | Status: DC

## 2021-04-17 NOTE — Progress Notes (Signed)
[facility] [place], [place] [address]  Internal MEDICINE  Office Visit Note  Patient Name: Donald Scott  [id number]  [id number]  Date of Service: 04/21/2021  Chief Complaint  Patient presents with   Medicare Wellness   Diabetes   Hypertension     HPI Pt is here for routine health maintenance examination -Bp has been 496 systolic at home, but is elevated again in office and did take medications this morning. Discussed increasing hydralazine to TID dosing. Did have satly steak this morning and smoked a cig before coming in. -Taking meloxicam for hip and shoulder and has been helping. Only takes it as needed. -still needs to have labs done and new slip printed today -he is otherwise doing well and has no new complaints today -he is up to date on colon cancer screening, carotid US reviewed last visit and continues to take crestor -he is not willing to discuss smoking cessation  Current Medication: Outpatient Encounter Medications as of 04/17/2021  Medication Sig   Alcohol Swabs PADS Use with cleaning area to test blood sugar twice a day   allopurinol (ZYLOPRIM) 100 MG tablet Take 1 tablet (100 mg total) by mouth daily.   amLODipine (NORVASC) 10 MG tablet Take 1 tablet (10 mg total) by mouth daily.   Blood Glucose Calibration (TRUE METRIX LEVEL 1) Low SOLN Use as directed to check blood sugar twice a day TRUE METRIX LEVEL 1 CTRL SOLN   fluticasone (FLONASE) 50 MCG/ACT nasal spray SHAKE LQ AND U 1 TO 2 SPRAYS IEN D FOR RHINITIS   hydrALAZINE (APRESOLINE) 25 MG tablet Take 1 tablet (25 mg total) by mouth 3 (three) times daily.   losartan-hydrochlorothiazide (HYZAAR) 100-25 MG tablet Take 1 tablet by mouth daily.   omeprazole (PRILOSEC) 40 MG capsule Take 1 capsule (40 mg total) by mouth daily.   rosuvastatin (CRESTOR) 5 MG tablet Take 1 tablet (5 mg total) by mouth daily.   TRUEplus Lancets 30G MISC Use to test blood sugar twice a day   [DISCONTINUED]  albuterol (VENTOLIN HFA) 108 (90 Base) MCG/ACT inhaler Inhale 1 puff into the lungs every 6 (six) hours as needed for wheezing or shortness of breath.   [DISCONTINUED] hydrALAZINE (APRESOLINE) 25 MG tablet TAKE 1 TABLET(25 MG) BY MOUTH TWICE DAILY   [DISCONTINUED] meloxicam (MOBIC) 7.5 MG tablet Take 1 tablet (7.5 mg total) by mouth daily.   meloxicam (MOBIC) 7.5 MG tablet Take 1 tablet (7.5 mg total) by mouth daily.   No facility-administered encounter medications on file as of 04/17/2021.    Surgical History: Past Surgical History:  Procedure Laterality Date   CAROTID STENT  2010   Vascular bypass graft      Medical History: Past Medical History:  Diagnosis Date   Diabetes mellitus ([place]) 05/22/2015   Gout 05/22/2015   Hypertension     Family History: Family History  Problem Relation Age of Onset   Arthritis Mother       Review of Systems  Constitutional:  Negative for chills, fatigue and unexpected weight change.  HENT:  Negative for congestion, postnasal drip, rhinorrhea, sneezing and sore throat.   Eyes:  Negative for redness.  Respiratory:  Negative for cough, chest tightness and shortness of breath.   Cardiovascular:  Negative for chest pain and palpitations.  Gastrointestinal:  Negative for abdominal pain, constipation, diarrhea, nausea and vomiting.  Genitourinary:  Negative for dysuria and frequency.  Musculoskeletal:  Positive for arthralgias. Negative for back pain, joint swelling  and neck pain.  Skin:  Negative for rash.  Neurological: Negative.  Negative for tremors and numbness.  Hematological:  Negative for adenopathy. Does not bruise/bleed easily.  Psychiatric/Behavioral:  Negative for behavioral problems (Depression), sleep disturbance and suicidal ideas. The patient is not nervous/anxious.     Vital Signs: BP (!) 158/90    Pulse 73    Temp 97.7 F (36.5 C)    Resp 16    Ht 5\' 7"  (1.702 m)    Wt 163 lb (73.9 kg)    SpO2 97%    BMI 25.53 kg/m     Physical Exam Vitals and nursing note reviewed.  Constitutional:      General: He is not in acute distress.    Appearance: He is well-developed and normal weight. He is not diaphoretic.  HENT:     Head: Normocephalic and atraumatic.     Mouth/Throat:     Pharynx: No oropharyngeal exudate.  Eyes:     Pupils: Pupils are equal, round, and reactive to light.  Neck:     Thyroid: No thyromegaly.     Vascular: No JVD.     Trachea: No tracheal deviation.  Cardiovascular:     Rate and Rhythm: Normal rate and regular rhythm.     Heart sounds: Normal heart sounds. No murmur heard.   No friction rub. No gallop.  Pulmonary:     Effort: Pulmonary effort is normal. No respiratory distress.     Breath sounds: No wheezing or rales.  Chest:     Chest wall: No tenderness.  Abdominal:     General: Bowel sounds are normal.     Palpations: Abdomen is soft.  Musculoskeletal:        General: Normal range of motion.     Cervical back: Normal range of motion and neck supple.  Lymphadenopathy:     Cervical: No cervical adenopathy.  Skin:    General: Skin is warm and dry.  Neurological:     Mental Status: He is alert and oriented to person, place, and time.     Cranial Nerves: No cranial nerve deficit.  Psychiatric:        Behavior: Behavior normal.        Thought Content: Thought content normal.        Judgment: Judgment normal.     LABS: Recent Results (from the past 2160 hour(s))  POCT HgB A1C     Status: Abnormal   Collection Time: 03/19/21  2:27 PM  Result Value Ref Range   Hemoglobin A1C 5.8 (A) 4.0 - 5.6 %   HbA1c POC (<> result, manual entry)     HbA1c, POC (prediabetic range)     HbA1c, POC (controlled diabetic range)          Assessment/Plan: 1. Encounter for general adult medical examination with abnormal findings CPE performed, routine fasting labs reordered, UTD on colon cancer screening  2. Essential hypertension Will increase hydralazine to TID for better BP  control and continue other meds as before. Continue to monitor - hydrALAZINE (APRESOLINE) 25 MG tablet; Take 1 tablet (25 mg total) by mouth 3 (three) times daily.  Dispense: 90 tablet; Refill: 2  3. Chronic left hip pain May continue mobic prn - meloxicam (MOBIC) 7.5 MG tablet; Take 1 tablet (7.5 mg total) by mouth daily.  Dispense: 30 tablet; Refill: 2  4. Cigarette nicotine dependence with other nicotine-induced disorder Unwilling to discuss smoking cessation, did have CT chest in June for cancer screening with  pulmonary nodules 1.79mm or less. Will continue to monitor  5. Aortic atherosclerosis ([place]) Continue crestor and will update labs - Lipid Panel With LDL/HDL Ratio  6. Other fatigue - CBC w/Diff/Platelet - Comprehensive metabolic panel  7. Abnormal thyroid blood test - TSH + free T4    General Counseling: Vikram verbalizes understanding of the findings of todays visit and agrees with plan of treatment. I have discussed any further diagnostic evaluation that may be needed or ordered today. We also reviewed his medications today. he has been encouraged to call the office with any questions or concerns that should arise related to todays visit.    Counseling:    Orders Placed This Encounter  Procedures   CBC w/Diff/Platelet   Comprehensive metabolic panel   TSH + free T4   Lipid Panel With LDL/HDL Ratio    Meds ordered this encounter  Medications   meloxicam (MOBIC) 7.5 MG tablet    Sig: Take 1 tablet (7.5 mg total) by mouth daily.    Dispense:  30 tablet    Refill:  2   hydrALAZINE (APRESOLINE) 25 MG tablet    Sig: Take 1 tablet (25 mg total) by mouth 3 (three) times daily.    Dispense:  90 tablet    Refill:  2    This patient was seen by Drema Dallas, PA-C in collaboration with Dr. Clayborn Bigness as a part of collaborative care agreement.  Total time spent:30 Minutes  Time spent includes review of chart, medications, test results, and follow up plan with  the patient.     Lavera Guise, MD  Internal Medicine

## 2021-04-21 ENCOUNTER — Other Ambulatory Visit: Payer: Self-pay | Admitting: Physician Assistant

## 2021-04-21 DIAGNOSIS — J439 Emphysema, unspecified: Secondary | ICD-10-CM

## 2021-04-22 ENCOUNTER — Telehealth: Payer: Self-pay | Admitting: Student-PharmD

## 2021-04-22 NOTE — Progress Notes (Addendum)
Hypertension (HTN) Review Call   Donald Scott,Donald Scott  [id number]  25 years, Male  DOB: 04-10-1950  M: [id number]  Hypertension Review (HC) Completed by Charlann Lange on 04/22/2021  Chart Review Is the patient enrolled in RPM with BP Monitor?: No  BP #1 reading (last): 158/90 on: 04/17/2021  BP #2 reading: 150/90 on: 03/19/2021  BP #3 reading: 160/86 on: 12/24/2020  Any of the last 3 BP > 140/90 mmHg?: Yes  What recent interventions/DTPs have been made by any provider to improve the patient's conditions in the last 3 months?:   Office Visit: 03/19/21 Mylinda Latina, PA-C. For type 2 diabetes mellitus. No medication changes. 04/17/21 McDonough, Si Gaul, PA-C. For Medicare wellness. CHANGED Hydralazine 25 mg 3 times daily.  Any recent hospitalizations or ED visits since last visit with CPP?: No  Adherence Review Adherence rates for STAR metric medications:  Rosuvastatin 5 mg 12/04/20 90 DS Losartan-Hydrochlorothiazide 100-25 mg 12/04/20 100 DS  Adherence rates for medications indicated for disease state being reviewed: Losartan-Hydrochlorothiazide 100-25 mg 12/04/20 100 DS  Does the patient have >5 day gap between last estimated fill dates for any of the above medications?: Yes Reasons for medication gaps: Rosuvastatin 5 mg 12/04/20 90 DS-Patient stated he is not taking this anymore Losartan-Hydrochlorothiazide 100-25 mg 12/04/20 100 DS-Patient stated he has this medication at home and is currently taking it.  Disease State Questions Able to connect with the Patient?: Yes  Is the patient monitoring his/her BP?: Yes How often are you checking your BP?: occasionally  Home BP Reading #1 (most recent): 140/60  Home BP Reading #2: 140/80  Is the patient having any low BP Readings <90/60?: No  Is the patient having any BP readings above >180/100?: No  Is the patient's average BP>140/90?: No  What is your blood pressure goal?: 120/80  Educate patient to inform proper  points on checking BP at home:: Do not drink caffeine or smoke a cigarette at least 30 min. prior to checking.  What diet changes have you made to improve your Blood Pressure Control?: eating more home-cooked meals  What exercise are you doing to improve your Blood Pressure Control?: walking  Charlann Lange, [place]  [phone]  Pharmacist Review  Adherence gaps identified?: No Drug Therapy Problems identified?: No Assessment: Controlled  Alena Bills Clinical Pharmacist [phone]

## 2021-06-09 ENCOUNTER — Telehealth: Payer: Self-pay | Admitting: Student-PharmD

## 2021-06-09 NOTE — Progress Notes (Addendum)
Hypertension (HTN) Review Call  ? 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 ?Hypertension Review (HC) ?Completed by Charlann Lange on 06/09/2021 ? ?Chart Review ?Is the patient enrolled in RPM with BP Monitor?: No ? ?BP #1 reading (last): 158/90 ?on: 04/17/2021 ? ?BP #2 reading: 150/90 ?on: 03/19/2021 ? ?BP #3 reading: 160/86 ?on: 12/24/2020 ? ?Any of the last 3 BP > 140/90 mmHg?: Yes ? ?What recent interventions have been made by any provider to improve the patient's conditions in the last 3 months?: None. ?Any recent hospitalizations or ED visits since last visit with CPP?: No ? ?Adherence Review ?Adherence rates for STAR metric medications: Rosuvastatin 5 mg 12/04/20 90 DS ? ?Losartan-Hydrochlorothiazide 100-25 mg 12/04/20 100 DS ?Adherence rates for medications indicated for disease state being reviewed: Losartan-Hydrochlorothiazide 100-25 mg 12/04/20 100 DS ? ?Does the patient have >5 day gap between last estimated fill dates for any of the above medications?: Yes ? ?Reasons for medication gaps: Losartan-Hydrochlorothiazide 100-25 mg 12/04/20 100 DS- Patient has this medication and is actively taking this medication.  ? ?Disease State Questions ?Able to connect with the Patient?: Yes ? ?Is the patient monitoring his/her BP?: No ? ?Review recommendations from CPP's note of how often patient should be checking and encourage monitoring blood pressures if patient has history of high BP.: Done ? ?What is your blood pressure goal?: 120/80 ?Educate patient to inform proper points on checking BP at home:: When taking resting blood pressure: sit quietly for 5 minutes, not within 30 min. of exercising, no talking., Sit with feet flat on the floor, arm at heart level. ? ?What diet changes have you made to improve your Blood Pressure Control?: eating more home-cooked meals ?What exercise are you doing to improve your Blood Pressure Control?: other ? ?Details: Patient stated he walks  about 2-4 miles a day. ? ?Engagement Notes ?Charlann Lange on 06/09/2021 01:28 PM ?[facility] Chart Review: 8 min 06/09/21 ?[facility] Assessment call time spent: 12 min 06/09/21 ? ?Charlann Lange, RMA ?Healthcare Concierge  ?[phone] ? ?Pharmacist Review ?Adherence gaps identified?: Yes ?Details: Losartan-HCTZ shows late to refill. Patient states they have medication ?Drug Therapy Problems identified?: No ?Assessment: Uncontrolled ?Plan: pt is being seen by PCP on 3/16 ? ?Alena Bills ?Clinical Pharmacist ? ? ?

## 2021-06-17 ENCOUNTER — Telehealth: Payer: Medicare HMO

## 2021-06-18 ENCOUNTER — Ambulatory Visit (INDEPENDENT_AMBULATORY_CARE_PROVIDER_SITE_OTHER): Payer: Medicare HMO | Admitting: Physician Assistant

## 2021-06-18 ENCOUNTER — Other Ambulatory Visit: Payer: Self-pay

## 2021-06-18 ENCOUNTER — Telehealth: Payer: Self-pay

## 2021-06-18 ENCOUNTER — Encounter: Payer: Self-pay | Admitting: Physician Assistant

## 2021-06-18 DIAGNOSIS — E1159 Type 2 diabetes mellitus with other circulatory complications: Secondary | ICD-10-CM | POA: Diagnosis not present

## 2021-06-18 DIAGNOSIS — K861 Other chronic pancreatitis: Secondary | ICD-10-CM | POA: Diagnosis not present

## 2021-06-18 DIAGNOSIS — I10 Essential (primary) hypertension: Secondary | ICD-10-CM

## 2021-06-18 DIAGNOSIS — R194 Change in bowel habit: Secondary | ICD-10-CM

## 2021-06-18 LAB — POCT GLYCOSYLATED HEMOGLOBIN (HGB A1C): Hemoglobin A1C: 5.7 % — AB (ref 4.0–5.6)

## 2021-06-18 NOTE — Telephone Encounter (Signed)
Scheduled for 09/28/2021 ?

## 2021-06-18 NOTE — Progress Notes (Signed)
[facility] ?[address] ?[place], [place] [address] ? ?Internal MEDICINE  ?Office Visit Note ? ?Patient Name: Donald Scott ? [id number]  ?[id number] ? ?Date of Service: 06/23/2021 ? ?Chief Complaint  ?Patient presents with  ? Follow-up  ? Diabetes  ? Hypertension  ? ? ?HPI ?Pt is here for routine follow up ?- has not done labs still ?- having GI upset again, has been a long time since he saw GI and they had him on pancreatic enzymes, is willing to re-establish with GI and would like referral today. Will have BM as soon as he eats and reports increased frequency of BM. ?-BP at home not checked recently  ?-son is in the hospital for kidney transplant rejection, wife also found out her heart is function is reduced as well therefore increased stress recently ?-He also did not take all of his BP meds yet today because he has not eaten and will do so now. Recheck of BP did not improve further. Discussed medication adherence and need to take meds on time. Further reviewed the meds he has and that hydralazine can be taken 3x per day not just twice if bp high. Needs to check BP at home consistently ? ?Current Medication: ?Outpatient Encounter Medications as of 06/18/2021  ?Medication Sig  ? albuterol (VENTOLIN HFA) 108 (90 Base) MCG/ACT inhaler INHALE 1 PUFF INTO THE LUNGS EVERY 6 HOURS AS NEEDED FOR WHEEZING OR SHORTNESS OF BREATH  ? Alcohol Swabs PADS Use with cleaning area to test blood sugar twice a day  ? allopurinol (ZYLOPRIM) 100 MG tablet Take 1 tablet (100 mg total) by mouth daily.  ? amLODipine (NORVASC) 10 MG tablet Take 1 tablet (10 mg total) by mouth daily.  ? Blood Glucose Calibration (TRUE METRIX LEVEL 1) Low SOLN Use as directed to check blood sugar twice a day ?TRUE METRIX LEVEL 1 CTRL SOLN  ? fluticasone (FLONASE) 50 MCG/ACT nasal spray SHAKE LQ AND U 1 TO 2 SPRAYS IEN D FOR RHINITIS  ? hydrALAZINE (APRESOLINE) 25 MG tablet Take 1 tablet (25 mg total) by mouth 3 (three) times daily.  ?  losartan-hydrochlorothiazide (HYZAAR) 100-25 MG tablet Take 1 tablet by mouth daily.  ? meloxicam (MOBIC) 7.5 MG tablet Take 1 tablet (7.5 mg total) by mouth daily.  ? omeprazole (PRILOSEC) 40 MG capsule Take 1 capsule (40 mg total) by mouth daily.  ? rosuvastatin (CRESTOR) 5 MG tablet Take 1 tablet (5 mg total) by mouth daily.  ? TRUEplus Lancets 30G MISC Use to test blood sugar twice a day  ? ?No facility-administered encounter medications on file as of 06/18/2021.  ? ? ?Surgical History: ?Past Surgical History:  ?Procedure Laterality Date  ? CAROTID STENT  2010  ? Vascular bypass graft    ? ? ?Medical History: ?Past Medical History:  ?Diagnosis Date  ? Diabetes mellitus ([place]) 05/22/2015  ? Gout 05/22/2015  ? Hypertension   ? ? ?Family History: ?Family History  ?Problem Relation Age of Onset  ? Arthritis Mother   ? ? ?Social History  ? ?Socioeconomic History  ? Marital status: Married  ?  Spouse name: Not on file  ? Number of children: Not on file  ? Years of education: Not on file  ? Highest education level: Not on file  ?Occupational History  ? Not on file  ?Tobacco Use  ? Smoking status: Every Day  ?  Packs/day: 1.00  ?  Years: 50.00  ?  Pack years: 50.00  ?  Types: Cigarettes  ? Smokeless tobacco: Never  ?Substance and Sexual Activity  ? Alcohol use: Yes  ?  Alcohol/week: 18.0 standard drinks  ?  Types: 18 Cans of beer per week  ? Drug use: No  ? Sexual activity: Not on file  ?Other Topics Concern  ? Not on file  ?Social History Narrative  ? Not on file  ? ?Social Determinants of Health  ? ?Financial Resource Strain: Not on file  ?Food Insecurity: Not on file  ?Transportation Needs: Not on file  ?Physical Activity: Not on file  ?Stress: Not on file  ?Social Connections: Not on file  ?Intimate Partner Violence: Not on file  ? ? ? ? ?Review of Systems  ?Constitutional:  Negative for chills, fatigue and unexpected weight change.  ?HENT:  Negative for congestion, postnasal drip, rhinorrhea, sneezing and sore throat.    ?Eyes:  Negative for redness.  ?Respiratory:  Negative for cough, chest tightness and shortness of breath.   ?Cardiovascular:  Negative for chest pain and palpitations.  ?Gastrointestinal:  Negative for abdominal pain, constipation, diarrhea, nausea and vomiting.  ?Genitourinary:  Negative for dysuria and frequency.  ?Musculoskeletal:  Positive for arthralgias. Negative for back pain, joint swelling and neck pain.  ?Skin:  Negative for rash.  ?Neurological: Negative.  Negative for tremors and numbness.  ?Hematological:  Negative for adenopathy. Does not bruise/bleed easily.  ?Psychiatric/Behavioral:  Negative for behavioral problems (Depression), sleep disturbance and suicidal ideas. The patient is not nervous/anxious.   ? ?Vital Signs: ?BP (!) 168/98   Pulse 72   Temp 98.1 ?F (36.7 ?C)   Resp 16   Ht '5\' 7"'$  (1.702 m)   Wt 157 lb (71.2 kg)   SpO2 99%   BMI 24.59 kg/m?  ? ? ?Physical Exam ?Vitals and nursing note reviewed.  ?Constitutional:   ?   General: He is not in acute distress. ?   Appearance: He is well-developed and normal weight. He is not diaphoretic.  ?HENT:  ?   Head: Normocephalic and atraumatic.  ?   Mouth/Throat:  ?   Pharynx: No oropharyngeal exudate.  ?Eyes:  ?   Pupils: Pupils are equal, round, and reactive to light.  ?Neck:  ?   Thyroid: No thyromegaly.  ?   Vascular: No JVD.  ?   Trachea: No tracheal deviation.  ?Cardiovascular:  ?   Rate and Rhythm: Normal rate and regular rhythm.  ?   Heart sounds: Normal heart sounds. No murmur heard. ?  No friction rub. No gallop.  ?Pulmonary:  ?   Effort: Pulmonary effort is normal. No respiratory distress.  ?   Breath sounds: No wheezing or rales.  ?Chest:  ?   Chest wall: No tenderness.  ?Abdominal:  ?   General: Bowel sounds are normal.  ?   Palpations: Abdomen is soft.  ?Musculoskeletal:     ?   General: Normal range of motion.  ?   Cervical back: Normal range of motion and neck supple.  ?Lymphadenopathy:  ?   Cervical: No cervical adenopathy.   ?Skin: ?   General: Skin is warm and dry.  ?Neurological:  ?   Mental Status: He is alert and oriented to person, place, and time.  ?   Cranial Nerves: No cranial nerve deficit.  ?Psychiatric:     ?   Behavior: Behavior normal.     ?   Thought Content: Thought content normal.     ?   Judgment: Judgment normal.  ? ? ? ? ? ?  Assessment/Plan: ?1. Essential hypertension ?BP very elevated in office however patient has not taken all of his medications today due to not eating breakfast yet.  Further discussed need for compliance and medication adherence.  Also discussed that patient can take hydralazine 3 times a day and needs to monitor blood pressures closely and log blood pressures. Pt should call or go to ED if BP continues to rise despite medication or if symptoms develop. ? ?2. Type 2 diabetes mellitus with other circulatory complication, without long-term current use of insulin (HCC) ?- POCT HgB A1C is 5.7 which is further improved from 5.8 at last check.  Continue to work on diet and exercise ? ?3. Other chronic pancreatitis ([place]) ?Patient previously on enzymes via GI however has not had follow-up or been on this medication in some time.  Will need new referral to GI due to worsening symptoms again ?- Ambulatory referral to Gastroenterology ? ?4. Frequent bowel movements ?Patient previously on enzymes via GI however has not had follow-up or been on this medication in some time.  Will need new referral to GI due to worsening symptoms again ?- Ambulatory referral to Gastroenterology ? ? ?General Counseling: Tavarious verbalizes understanding of the findings of todays visit and agrees with plan of treatment. I have discussed any further diagnostic evaluation that may be needed or ordered today. We also reviewed his medications today. he has been encouraged to call the office with any questions or concerns that should arise related to todays visit. ? ?Hypertension Counseling: ? ? The following hypertensive lifestyle  modification were recommended and discussed:  ?1. Limiting alcohol intake to less than 1 oz/day of ethanol:(24 oz of beer or 8 oz of wine or 2 oz of 100-proof whiskey). ?2. Take baby ASA 81 mg daily. ?3. Importance of regu

## 2021-07-13 ENCOUNTER — Other Ambulatory Visit: Payer: Self-pay | Admitting: Physician Assistant

## 2021-07-13 DIAGNOSIS — G8929 Other chronic pain: Secondary | ICD-10-CM

## 2021-07-14 ENCOUNTER — Other Ambulatory Visit: Payer: Self-pay | Admitting: Physician Assistant

## 2021-07-14 DIAGNOSIS — I10 Essential (primary) hypertension: Secondary | ICD-10-CM

## 2021-07-27 ENCOUNTER — Encounter: Payer: Self-pay | Admitting: Physician Assistant

## 2021-07-27 ENCOUNTER — Ambulatory Visit (INDEPENDENT_AMBULATORY_CARE_PROVIDER_SITE_OTHER): Payer: Medicare HMO | Admitting: Physician Assistant

## 2021-07-27 VITALS — BP 200/100 | HR 70 | Temp 97.6°F | Resp 16 | Ht 67.0 in | Wt 158.4 lb

## 2021-07-27 DIAGNOSIS — F411 Generalized anxiety disorder: Secondary | ICD-10-CM | POA: Diagnosis not present

## 2021-07-27 DIAGNOSIS — I10 Essential (primary) hypertension: Secondary | ICD-10-CM | POA: Diagnosis not present

## 2021-07-27 MED ORDER — ESCITALOPRAM OXALATE 10 MG PO TABS: 10.0000 mg | ORAL_TABLET | Freq: Every day | ORAL | 2 refills | Status: DC

## 2021-07-27 NOTE — Progress Notes (Signed)
[facility] ?[address] ?[place], [place] [address] ? ?Internal MEDICINE  ?Office Visit Note ? ?Patient Name: Donald Scott ? [id number]  ?[id number] ? ?Date of Service: 07/27/2021 ? ?Chief Complaint  ?Patient presents with  ? Follow-up  ? Diabetes  ? Hypertension  ? ? ?HPI ?Pt is here for routine follow up ?-Nasal spray for allergies ?-BP at home 110/75 yesterday morning, and is normally well controlled when wife is not home. ?-did have pepsi this morning, but avoided cig before coming to help get BP ideal and has taken his medications already this morning.  He reports he has not yet taken his midday hydralazine dose and will be due to take that upon leaving office ?-He states that he feels overworked and has not had a vacation in 4 years.  He reports that when he is stressed his blood pressure tends to rise that he attributes this to his workload as well as his wife.  He reports that confrontations with her have been causing stress and anxiety.  He states that she did come home unexpectedly before his visit today and this may be why his blood pressure is so high currently as he was frustrated with her.  He plans to have a discussion with her rather than continuing to keep his feelings bottled up leading him to stress further and cause blood pressure to rise further.  When he checks his blood pressure when she is not around this is when he is getting well-controlled readings such as his finding yesterday 110/75.  He even reports one reading at 90/60 and denies any lightheadedness or dizziness associated with this. ?-Pt will be seeing GI in June ?-Will take midday hydralazine now due to very elevated blood pressure.  Blood pressure was rechecked 3 times in office and did not improve however patient continues to get more and more worked up the more he discusses problems at home.  Discussed starting on medication to help with stress and anxiety as root cause for his blood pressure rising as his blood  pressure tends to be well controlled when he is not feeling stressed or anxious and therefore want to avoid changing blood pressure medications at this time as he is almost had some low readings as well.  Did discuss however if not improving will need to consider updating echo or cardiology referral in future ?-Patient was advised to go to ED if blood pressures not improving with midday dose of hydralazine or if any symptoms develop.  Patient understands that blood pressures as high do put him at elevated risk for things such as heart attack or stroke and are not good for his overall health.  He denies chest pain, palpitations, shortness of breath, headaches, weakness, or blurred vision ? ?Current Medication: ?Outpatient Encounter Medications as of 07/27/2021  ?Medication Sig  ? albuterol (VENTOLIN HFA) 108 (90 Base) MCG/ACT inhaler INHALE 1 PUFF INTO THE LUNGS EVERY 6 HOURS AS NEEDED FOR WHEEZING OR SHORTNESS OF BREATH  ? Alcohol Swabs PADS Use with cleaning area to test blood sugar twice a day  ? allopurinol (ZYLOPRIM) 100 MG tablet Take 1 tablet (100 mg total) by mouth daily.  ? amLODipine (NORVASC) 10 MG tablet Take 1 tablet (10 mg total) by mouth daily.  ? Blood Glucose Calibration (TRUE METRIX LEVEL 1) Low SOLN Use as directed to check blood sugar twice a day ?TRUE METRIX LEVEL 1 CTRL SOLN  ? escitalopram (LEXAPRO) 10 MG tablet Take 1 tablet (10 mg total)  by mouth daily.  ? fluticasone (FLONASE) 50 MCG/ACT nasal spray SHAKE LQ AND U 1 TO 2 SPRAYS IEN D FOR RHINITIS  ? hydrALAZINE (APRESOLINE) 25 MG tablet TAKE 1 TABLET(25 MG) BY MOUTH THREE TIMES DAILY  ? losartan-hydrochlorothiazide (HYZAAR) 100-25 MG tablet Take 1 tablet by mouth daily.  ? meloxicam (MOBIC) 7.5 MG tablet TAKE 1 TABLET(7.5 MG) BY MOUTH DAILY  ? omeprazole (PRILOSEC) 40 MG capsule Take 1 capsule (40 mg total) by mouth daily.  ? rosuvastatin (CRESTOR) 5 MG tablet Take 1 tablet (5 mg total) by mouth daily.  ? TRUEplus Lancets 30G MISC Use to test  blood sugar twice a day  ? ?No facility-administered encounter medications on file as of 07/27/2021.  ? ? ?Surgical History: ?Past Surgical History:  ?Procedure Laterality Date  ? CAROTID STENT  2010  ? Vascular bypass graft    ? ? ?Medical History: ?Past Medical History:  ?Diagnosis Date  ? Diabetes mellitus ([place]) 05/22/2015  ? Gout 05/22/2015  ? Hypertension   ? ? ?Family History: ?Family History  ?Problem Relation Age of Onset  ? Arthritis Mother   ? ? ?Social History  ? ?Socioeconomic History  ? Marital status: Married  ?  Spouse name: Not on file  ? Number of children: Not on file  ? Years of education: Not on file  ? Highest education level: Not on file  ?Occupational History  ? Not on file  ?Tobacco Use  ? Smoking status: Every Day  ?  Packs/day: 1.00  ?  Years: 50.00  ?  Pack years: 50.00  ?  Types: Cigarettes  ? Smokeless tobacco: Never  ?Substance and Sexual Activity  ? Alcohol use: Yes  ?  Alcohol/week: 18.0 standard drinks  ?  Types: 18 Cans of beer per week  ? Drug use: No  ? Sexual activity: Not on file  ?Other Topics Concern  ? Not on file  ?Social History Narrative  ? Not on file  ? ?Social Determinants of Health  ? ?Financial Resource Strain: Not on file  ?Food Insecurity: Not on file  ?Transportation Needs: Not on file  ?Physical Activity: Not on file  ?Stress: Not on file  ?Social Connections: Not on file  ?Intimate Partner Violence: Not on file  ? ? ? ? ?Review of Systems  ?Constitutional:  Negative for chills, fatigue and unexpected weight change.  ?HENT:  Negative for congestion, postnasal drip, rhinorrhea, sneezing and sore throat.   ?Eyes:  Negative for redness and visual disturbance.  ?Respiratory:  Negative for cough, chest tightness and shortness of breath.   ?Cardiovascular:  Negative for chest pain and palpitations.  ?Gastrointestinal:  Positive for abdominal pain. Negative for constipation, diarrhea, nausea and vomiting.  ?Genitourinary:  Negative for dysuria and frequency.   ?Musculoskeletal:  Negative for back pain, joint swelling and neck pain.  ?Skin:  Negative for rash.  ?Neurological: Negative.  Negative for dizziness, tremors, weakness, numbness and headaches.  ?Hematological:  Negative for adenopathy. Does not bruise/bleed easily.  ?Psychiatric/Behavioral:  Negative for behavioral problems (Depression), sleep disturbance and suicidal ideas. The patient is nervous/anxious.   ? ?Vital Signs: ?BP (!) 200/100 Comment: 202/100  Pulse 70   Temp 97.6 ?F (36.4 ?C)   Resp 16   Ht '5\' 7"'$  (1.702 m)   Wt 158 lb 6.4 oz (71.8 kg)   SpO2 99%   BMI 24.81 kg/m?  ? ? ?Physical Exam ?Vitals and nursing note reviewed.  ?Constitutional:   ?   General: He  is not in acute distress. ?   Appearance: He is well-developed and normal weight. He is not diaphoretic.  ?HENT:  ?   Head: Normocephalic and atraumatic.  ?   Mouth/Throat:  ?   Pharynx: No oropharyngeal exudate.  ?Eyes:  ?   Pupils: Pupils are equal, round, and reactive to light.  ?Neck:  ?   Thyroid: No thyromegaly.  ?   Vascular: No JVD.  ?   Trachea: No tracheal deviation.  ?Cardiovascular:  ?   Rate and Rhythm: Normal rate and regular rhythm.  ?   Heart sounds: Normal heart sounds. No murmur heard. ?  No friction rub. No gallop.  ?Pulmonary:  ?   Effort: Pulmonary effort is normal. No respiratory distress.  ?   Breath sounds: No wheezing or rales.  ?Chest:  ?   Chest wall: No tenderness.  ?Abdominal:  ?   General: Bowel sounds are normal.  ?   Palpations: Abdomen is soft.  ?Musculoskeletal:     ?   General: Normal range of motion.  ?   Cervical back: Normal range of motion and neck supple.  ?Lymphadenopathy:  ?   Cervical: No cervical adenopathy.  ?Skin: ?   General: Skin is warm and dry.  ?Neurological:  ?   Mental Status: He is alert and oriented to person, place, and time.  ?   Cranial Nerves: No cranial nerve deficit.  ?Psychiatric:     ?   Behavior: Behavior normal.     ?   Thought Content: Thought content normal.     ?   Judgment:  Judgment normal.  ? ? ? ? ? ?Assessment/Plan: ?1. GAD (generalized anxiety disorder) ?Start on 10 mg of Lexapro to help with stress and anxiety as this is likely root cause for his elevated blood pressure.  Patient is also go

## 2021-08-17 ENCOUNTER — Ambulatory Visit (INDEPENDENT_AMBULATORY_CARE_PROVIDER_SITE_OTHER): Payer: Medicare HMO | Admitting: Physician Assistant

## 2021-08-17 ENCOUNTER — Other Ambulatory Visit
Admission: RE | Admit: 2021-08-17 | Discharge: 2021-08-17 | Disposition: A | Discharge status: Home / Self Care | Payer: Medicare HMO | Attending: Physician Assistant | Admitting: Physician Assistant

## 2021-08-17 ENCOUNTER — Encounter: Payer: Self-pay | Admitting: Physician Assistant

## 2021-08-17 VITALS — BP 188/90 | HR 69 | Temp 98.0°F | Resp 16 | Ht 67.0 in | Wt 156.6 lb

## 2021-08-17 DIAGNOSIS — F411 Generalized anxiety disorder: Secondary | ICD-10-CM

## 2021-08-17 DIAGNOSIS — I700 Atherosclerosis of aorta: Secondary | ICD-10-CM | POA: Diagnosis present

## 2021-08-17 DIAGNOSIS — R5383 Other fatigue: Secondary | ICD-10-CM | POA: Insufficient documentation

## 2021-08-17 DIAGNOSIS — I10 Essential (primary) hypertension: Secondary | ICD-10-CM

## 2021-08-17 DIAGNOSIS — R7989 Other specified abnormal findings of blood chemistry: Secondary | ICD-10-CM | POA: Insufficient documentation

## 2021-08-17 LAB — CBC WITH DIFFERENTIAL/PLATELET
Abs Immature Granulocytes: 0.02 10*3/uL (ref 0.00–0.07)
Basophils Absolute: 0 10*3/uL (ref 0.0–0.1)
Basophils Relative: 1 %
Eosinophils Absolute: 0.1 10*3/uL (ref 0.0–0.5)
Eosinophils Relative: 2 %
HCT: 38.7 % — ABNORMAL LOW (ref 39.0–52.0)
Hemoglobin: 12.5 g/dL — ABNORMAL LOW (ref 13.0–17.0)
Immature Granulocytes: 0 %
Lymphocytes Relative: 25 %
Lymphs Abs: 1.6 10*3/uL (ref 0.7–4.0)
MCH: 30.4 pg (ref 26.0–34.0)
MCHC: 32.3 g/dL (ref 30.0–36.0)
MCV: 94.2 fL (ref 80.0–100.0)
Monocytes Absolute: 0.5 10*3/uL (ref 0.1–1.0)
Monocytes Relative: 8 %
Neutro Abs: 4.3 10*3/uL (ref 1.7–7.7)
Neutrophils Relative %: 64 %
Platelets: 137 10*3/uL — ABNORMAL LOW (ref 150–400)
RBC: 4.11 MIL/uL — ABNORMAL LOW (ref 4.22–5.81)
RDW: 12.6 % (ref 11.5–15.5)
WBC: 6.6 10*3/uL (ref 4.0–10.5)
nRBC: 0 % (ref 0.0–0.2)

## 2021-08-17 LAB — TSH: TSH: 1.284 u[IU]/mL (ref 0.350–4.500)

## 2021-08-17 LAB — COMPREHENSIVE METABOLIC PANEL
ALT: 28 U/L (ref 0–44)
AST: 28 U/L (ref 15–41)
Albumin: 3.9 g/dL (ref 3.5–5.0)
Alkaline Phosphatase: 57 U/L (ref 38–126)
Anion gap: 6 (ref 5–15)
BUN: 15 mg/dL (ref 8–23)
CO2: 26 mmol/L (ref 22–32)
Calcium: 8.3 mg/dL — ABNORMAL LOW (ref 8.9–10.3)
Chloride: 106 mmol/L (ref 98–111)
Creatinine, Ser: 1.07 mg/dL (ref 0.61–1.24)
GFR, Estimated: >60 mL/min (ref >60)
Glucose, Bld: 121 mg/dL — ABNORMAL HIGH (ref 70–99)
Potassium: 3.7 mmol/L (ref 3.5–5.1)
Sodium: 138 mmol/L (ref 135–145)
Total Bilirubin: 1 mg/dL (ref 0.3–1.2)
Total Protein: 7.1 g/dL (ref 6.5–8.1)

## 2021-08-17 LAB — LIPID PANEL
Cholesterol: 134 mg/dL (ref 0–200)
HDL: 71 mg/dL (ref >40)
LDL Cholesterol: 50 mg/dL (ref 0–99)
Total CHOL/HDL Ratio: 1.9 RATIO
Triglycerides: 67 mg/dL (ref <150)
VLDL: 13 mg/dL (ref 0–40)

## 2021-08-17 LAB — T4, FREE: Free T4: 0.75 ng/dL (ref 0.61–1.12)

## 2021-08-17 NOTE — Progress Notes (Signed)
[facility] [place], [place] [address]  Internal MEDICINE  Office Visit Note  Patient Name: Donald Scott  [id number]  [id number]  Date of Service: 08/18/2021  Chief Complaint  Patient presents with   Follow-up   Diabetes   Hypertension    Pt wants BP checked manually, refused machine    HPI Pt is here for follow up on HTN -Has cut down on salt and the last week Bp has been better. Does fluctuate some still -Hasnt eaten anything, just water. Had cig before he left home, but not on the way before appt. -Some allergies currently -he has been taking Amlodipine, hydralazine and losartan-hctz.  -Lexapro has been helping, wife looked up use for it and has been trying to make some changes as well to help reduce his stress -labs still not done, because he forgets and has soda in AM. Has not had anything, but water today and will go straight over to lab now with new lab slip -Discussed needing to bring his cuff into office to make sure it is reading accurately as readings have been consistently high in office and he attributes this to the office setting. Did explain that his BP is significantly elevated more so than just white coat elevation now and higher than a few months ago -He denies any symptoms including CP, palpitations, SOB, headaches, weakness, or blurred vision. He states he normally can tell if BP elevated.  Current Medication: Outpatient Encounter Medications as of 08/17/2021  Medication Sig   albuterol (VENTOLIN HFA) 108 (90 Base) MCG/ACT inhaler INHALE 1 PUFF INTO THE LUNGS EVERY 6 HOURS AS NEEDED FOR WHEEZING OR SHORTNESS OF BREATH   Alcohol Swabs PADS Use with cleaning area to test blood sugar twice a day   allopurinol (ZYLOPRIM) 100 MG tablet Take 1 tablet (100 mg total) by mouth daily.   amLODipine (NORVASC) 10 MG tablet Take 1 tablet (10 mg total) by mouth daily.   Blood Glucose Calibration (TRUE METRIX LEVEL 1) Low SOLN Use as directed to  check blood sugar twice a day TRUE METRIX LEVEL 1 CTRL SOLN   escitalopram (LEXAPRO) 10 MG tablet Take 1 tablet (10 mg total) by mouth daily.   fluticasone (FLONASE) 50 MCG/ACT nasal spray SHAKE LQ AND U 1 TO 2 SPRAYS IEN D FOR RHINITIS   hydrALAZINE (APRESOLINE) 25 MG tablet TAKE 1 TABLET(25 MG) BY MOUTH THREE TIMES DAILY   losartan-hydrochlorothiazide (HYZAAR) 100-25 MG tablet Take 1 tablet by mouth daily.   meloxicam (MOBIC) 7.5 MG tablet TAKE 1 TABLET(7.5 MG) BY MOUTH DAILY   omeprazole (PRILOSEC) 40 MG capsule Take 1 capsule (40 mg total) by mouth daily.   rosuvastatin (CRESTOR) 5 MG tablet Take 1 tablet (5 mg total) by mouth daily.   TRUEplus Lancets 30G MISC Use to test blood sugar twice a day   [DISCONTINUED] spironolactone (ALDACTONE) 25 MG tablet Take 1 tablet (25 mg total) by mouth daily.   spironolactone (ALDACTONE) 25 MG tablet Take 1 tablet (25 mg total) by mouth daily.   No facility-administered encounter medications on file as of 08/17/2021.    Surgical History: Past Surgical History:  Procedure Laterality Date   CAROTID STENT  2010   Vascular bypass graft      Medical History: Past Medical History:  Diagnosis Date   Diabetes mellitus ([place]) 05/22/2015   Gout 05/22/2015   Hypertension     Family History: Family History  Problem Relation Age of Onset   Arthritis Mother  Social History   Socioeconomic History   Marital status: Married    Spouse name: Not on file   Number of children: Not on file   Years of education: Not on file   Highest education level: Not on file  Occupational History   Not on file  Tobacco Use   Smoking status: Every Day    Packs/day: 1.00    Years: 50.00    Pack years: 50.00    Types: Cigarettes   Smokeless tobacco: Never  Substance and Sexual Activity   Alcohol use: Yes    Alcohol/week: 18.0 standard drinks    Types: 18 Cans of beer per week   Drug use: No   Sexual activity: Not on file  Other Topics Concern   Not on  file  Social History Narrative   Not on file   Social Determinants of Health   Financial Resource Strain: Not on file  Food Insecurity: Not on file  Transportation Needs: Not on file  Physical Activity: Not on file  Stress: Not on file  Social Connections: Not on file  Intimate Partner Violence: Not on file      Review of Systems  Constitutional:  Negative for chills, fatigue and unexpected weight change.  HENT:  Positive for postnasal drip. Negative for congestion, rhinorrhea, sneezing and sore throat.   Eyes:  Negative for redness.  Respiratory:  Negative for cough, chest tightness and shortness of breath.   Cardiovascular:  Negative for chest pain and palpitations.  Gastrointestinal:  Negative for abdominal pain, constipation, diarrhea, nausea and vomiting.  Genitourinary:  Negative for dysuria and frequency.  Musculoskeletal:  Negative for arthralgias, back pain, joint swelling and neck pain.  Skin:  Negative for rash.  Allergic/Immunologic: Positive for environmental allergies.  Neurological: Negative.  Negative for tremors and numbness.  Hematological:  Negative for adenopathy. Does not bruise/bleed easily.  Psychiatric/Behavioral:  Negative for behavioral problems (Depression), sleep disturbance and suicidal ideas. The patient is not nervous/anxious.    Vital Signs: BP (!) 188/90 Comment: 190/92  Pulse 69   Temp 98 F (36.7 C)   Resp 16   Ht '5\' 7"'$  (1.702 m)   Wt 156 lb 9.6 oz (71 kg)   SpO2 100%   BMI 24.53 kg/m    Physical Exam Vitals and nursing note reviewed.  Constitutional:      General: He is not in acute distress.    Appearance: He is well-developed and normal weight. He is not diaphoretic.  HENT:     Head: Normocephalic and atraumatic.     Mouth/Throat:     Pharynx: No oropharyngeal exudate.  Eyes:     Pupils: Pupils are equal, round, and reactive to light.  Neck:     Thyroid: No thyromegaly.     Vascular: No JVD.     Trachea: No tracheal  deviation.  Cardiovascular:     Rate and Rhythm: Normal rate and regular rhythm.     Heart sounds: Normal heart sounds. No murmur heard.   No friction rub. No gallop.  Pulmonary:     Effort: Pulmonary effort is normal. No respiratory distress.     Breath sounds: No wheezing or rales.  Chest:     Chest wall: No tenderness.  Abdominal:     General: Bowel sounds are normal.     Palpations: Abdomen is soft.  Musculoskeletal:        General: Normal range of motion.     Cervical back: Normal range of motion and  neck supple.  Lymphadenopathy:     Cervical: No cervical adenopathy.  Skin:    General: Skin is warm and dry.  Neurological:     Mental Status: He is alert and oriented to person, place, and time.     Cranial Nerves: No cranial nerve deficit.  Psychiatric:        Behavior: Behavior normal.        Thought Content: Thought content normal.        Judgment: Judgment normal.       Assessment/Plan: 1. Essential hypertension Continued elevation in office. Will continue current medications as before and will add spironolactone once a day. Requested pt bring his cuff into office for comparison since he reports better numbers at home. Continue low salt diet and avoid excess caffeine. Go to ED if BP continues to rise and/or symptoms develop. May need to see cardiology if labs ok and BP continues to not improve. - spironolactone (ALDACTONE) 25 MG tablet; Take 1 tablet (25 mg total) by mouth daily.  Dispense: 90 tablet; Refill: 3  2. GAD (generalized anxiety disorder) Improving, continue lexapro as before     General Counseling: Wagner verbalizes understanding of the findings of todays visit and agrees with plan of treatment. I have discussed any further diagnostic evaluation that may be needed or ordered today. We also reviewed his medications today. he has been encouraged to call the office with any questions or concerns that should arise related to todays visit.  Hypertension  Counseling:   The following hypertensive lifestyle modification were recommended and discussed:  1. Limiting alcohol intake to less than 1 oz/day of ethanol:(24 oz of beer or 8 oz of wine or 2 oz of 100-proof whiskey). 2. Take baby ASA 81 mg daily. 3. Importance of regular aerobic exercise and losing weight. 4. Reduce dietary saturated fat and cholesterol intake for overall cardiovascular health. 5. Maintaining adequate dietary potassium, calcium, and magnesium intake. 6. Regular monitoring of the blood pressure. 7. Reduce sodium intake to less than 100 mmol/day (less than 2.3 gm of sodium or less than 6 gm of sodium choride)    No orders of the defined types were placed in this encounter.   Meds ordered this encounter  Medications   DISCONTD: spironolactone (ALDACTONE) 25 MG tablet    Sig: Take 1 tablet (25 mg total) by mouth daily.    Dispense:  90 tablet    Refill:  3   spironolactone (ALDACTONE) 25 MG tablet    Sig: Take 1 tablet (25 mg total) by mouth daily.    Dispense:  90 tablet    Refill:  3    This patient was seen by Drema Dallas, PA-C in collaboration with Dr. Clayborn Bigness as a part of collaborative care agreement.   Total time spent:30 Minutes Time spent includes review of chart, medications, test results, and follow up plan with the patient.      Dr Lavera Guise Internal medicine

## 2021-08-18 MED ORDER — SPIRONOLACTONE 25 MG PO TABS: 25.0000 mg | ORAL_TABLET | Freq: Every day | ORAL | 3 refills | Status: DC

## 2021-08-18 NOTE — Patient Instructions (Signed)
Hypertension, Adult High blood pressure (hypertension) is when the force of blood pumping through the arteries is too strong. The arteries are the blood vessels that carry blood from the heart throughout the body. Hypertension forces the heart to work harder to pump blood and may cause arteries to become narrow or stiff. Untreated or uncontrolled hypertension can lead to a heart attack, heart failure, a stroke, kidney disease, and other problems. A blood pressure reading consists of a higher number over a lower number. Ideally, your blood pressure should be below 120/80. The first ("top") number is called the systolic pressure. It is a measure of the pressure in your arteries as your heart beats. The second ("bottom") number is called the diastolic pressure. It is a measure of the pressure in your arteries as the heart relaxes. What are the causes? The exact cause of this condition is not known. There are some conditions that result in high blood pressure. What increases the risk? Certain factors may make you more likely to develop high blood pressure. Some of these risk factors are under your control, including: Smoking. Not getting enough exercise or physical activity. Being overweight. Having too much fat, sugar, calories, or salt (sodium) in your diet. Drinking too much alcohol. Other risk factors include: Having a personal history of heart disease, diabetes, high cholesterol, or kidney disease. Stress. Having a family history of high blood pressure and high cholesterol. Having obstructive sleep apnea. Age. The risk increases with age. What are the signs or symptoms? High blood pressure may not cause symptoms. Very high blood pressure (hypertensive crisis) may cause: Headache. Fast or irregular heartbeats (palpitations). Shortness of breath. Nosebleed. Nausea and vomiting. Vision changes. Severe chest pain, dizziness, and seizures. How is this diagnosed? This condition is diagnosed by  measuring your blood pressure while you are seated, with your arm resting on a flat surface, your legs uncrossed, and your feet flat on the floor. The cuff of the blood pressure monitor will be placed directly against the skin of your upper arm at the level of your heart. Blood pressure should be measured at least twice using the same arm. Certain conditions can cause a difference in blood pressure between your right and left arms. If you have a high blood pressure reading during one visit or you have normal blood pressure with other risk factors, you may be asked to: Return on a different day to have your blood pressure checked again. Monitor your blood pressure at home for 1 week or longer. If you are diagnosed with hypertension, you may have other blood or imaging tests to help your health care provider understand your overall risk for other conditions. How is this treated? This condition is treated by making healthy lifestyle changes, such as eating healthy foods, exercising more, and reducing your alcohol intake. You may be referred for counseling on a healthy diet and physical activity. Your health care provider may prescribe medicine if lifestyle changes are not enough to get your blood pressure under control and if: Your systolic blood pressure is above 130. Your diastolic blood pressure is above 80. Your personal target blood pressure may vary depending on your medical conditions, your age, and other factors. Follow these instructions at home: Eating and drinking  Eat a diet that is high in fiber and potassium, and low in sodium, added sugar, and fat. An example of this eating plan is called the DASH diet. DASH stands for Dietary Approaches to Stop Hypertension. To eat this way: Eat   plenty of fresh fruits and vegetables. Try to fill one half of your plate at each meal with fruits and vegetables. Eat whole grains, such as whole-wheat pasta, brown rice, or whole-grain bread. Fill about one  fourth of your plate with whole grains. Eat or drink low-fat dairy products, such as skim milk or low-fat yogurt. Avoid fatty cuts of meat, processed or cured meats, and poultry with skin. Fill about one fourth of your plate with lean proteins, such as fish, chicken without skin, beans, eggs, or tofu. Avoid pre-made and processed foods. These tend to be higher in sodium, added sugar, and fat. Reduce your daily sodium intake. Many people with hypertension should eat less than 1,500 mg of sodium a day. Do not drink alcohol if: Your health care provider tells you not to drink. You are pregnant, may be pregnant, or are planning to become pregnant. If you drink alcohol: Limit how much you have to: 0-1 drink a day for women. 0-2 drinks a day for men. Know how much alcohol is in your drink. In the [place] one drink equals one 12 oz bottle of beer (355 mL), one 5 oz glass of wine (148 mL), or one 1 oz glass of hard liquor (44 mL). Lifestyle  Work with your health care provider to maintain a healthy body weight or to lose weight. Ask what an ideal weight is for you. Get at least 30 minutes of exercise that causes your heart to beat faster (aerobic exercise) most days of the week. Activities may include walking, swimming, or biking. Include exercise to strengthen your muscles (resistance exercise), such as Pilates or lifting weights, as part of your weekly exercise routine. Try to do these types of exercises for 30 minutes at least 3 days a week. Do not use any products that contain nicotine or tobacco. These products include cigarettes, chewing tobacco, and vaping devices, such as e-cigarettes. If you need help quitting, ask your health care provider. Monitor your blood pressure at home as told by your health care provider. Keep all follow-up visits. This is important. Medicines Take over-the-counter and prescription medicines only as told by your health care provider. Follow directions carefully. Blood  pressure medicines must be taken as prescribed. Do not skip doses of blood pressure medicine. Doing this puts you at risk for problems and can make the medicine less effective. Ask your health care provider about side effects or reactions to medicines that you should watch for. Contact a health care provider if you: Think you are having a reaction to a medicine you are taking. Have headaches that keep coming back (recurring). Feel dizzy. Have swelling in your ankles. Have trouble with your vision. Get help right away if you: Develop a severe headache or confusion. Have unusual weakness or numbness. Feel faint. Have severe pain in your chest or abdomen. Vomit repeatedly. Have trouble breathing. These symptoms may be an emergency. Get help right away. Call 911. Do not wait to see if the symptoms will go away. Do not drive yourself to the hospital. Summary Hypertension is when the force of blood pumping through your arteries is too strong. If this condition is not controlled, it may put you at risk for serious complications. Your personal target blood pressure may vary depending on your medical conditions, your age, and other factors. For most people, a normal blood pressure is less than 120/80. Hypertension is treated with lifestyle changes, medicines, or a combination of both. Lifestyle changes include losing weight, eating a healthy,   low-sodium diet, exercising more, and limiting alcohol. This information is not intended to replace advice given to you by your health care provider. Make sure you discuss any questions you have with your health care provider. Document Revised: 01/27/2021 Document Reviewed: 01/27/2021 Elsevier Patient Education  2023 [employer]  

## 2021-08-19 ENCOUNTER — Telehealth: Payer: Self-pay

## 2021-08-19 NOTE — Telephone Encounter (Signed)
Spoke with patient regarding lab results on 08/19/2021. ?

## 2021-08-19 NOTE — Telephone Encounter (Signed)
-----   Message from Mylinda Latina, PA-C sent at 08/19/2021  1:10 PM EDT ----- ?Please let patient know that his calcium is low and should start supplementing OTC. Additionally his hemoglobin and platelets are a little low and will need monitoring--please confirm he has not seen any blood in stool. His cholesterol and thyroid labs looked good. Also please remind him that I did send an additional bp med to pharmacy to start on and to bring his bp cuff to his next appt. ?

## 2021-09-02 ENCOUNTER — Telehealth: Payer: Medicare HMO

## 2021-09-18 ENCOUNTER — Ambulatory Visit (INDEPENDENT_AMBULATORY_CARE_PROVIDER_SITE_OTHER): Payer: Medicare HMO | Admitting: Physician Assistant

## 2021-09-18 ENCOUNTER — Encounter: Payer: Self-pay | Admitting: Physician Assistant

## 2021-09-18 VITALS — BP 170/82 | HR 71 | Temp 98.0°F | Resp 16 | Ht 67.0 in | Wt 156.0 lb

## 2021-09-18 DIAGNOSIS — G4710 Hypersomnia, unspecified: Secondary | ICD-10-CM

## 2021-09-18 DIAGNOSIS — I10 Essential (primary) hypertension: Secondary | ICD-10-CM

## 2021-09-18 DIAGNOSIS — F411 Generalized anxiety disorder: Secondary | ICD-10-CM | POA: Diagnosis not present

## 2021-09-18 MED ORDER — HYDRALAZINE HCL 50 MG PO TABS: 50.0000 mg | ORAL_TABLET | Freq: Three times a day (TID) | ORAL | 3 refills | Status: DC

## 2021-09-18 NOTE — Progress Notes (Signed)
[facility] [place], [place] [address]  Internal MEDICINE  Office Visit Note  Patient Name: Donald Scott  [id number]  [id number]  Date of Service: 10/02/2021  Chief Complaint  Patient presents with   Follow-up    hypertension   Diabetes   Hypertension    HPI Pt is here for routine follow up -Not checking BP lately, but reports previously having some high readings and some good readings -Takes all meds in Am except hydralazine TID -178/78 manual recheck; his cuff: 189/90 -Lexapro helps some with anxiety/stress and reports his wife has stopped nagging as much -Has done sleep study vs pulse ox in past in prison? Unsure of results. Discussed checking HST as sleep apnea untreated could be contributing to uncontrolled BP.  -Does feel he sleeps well at night, no snoring usually, and unaware of gasping or startling awake. -Will also check renal artery [place] to look for other causes of elevated BP -Will go ahead and move Amlodipine to evening with increase to '50mg'$  hydralazine TID. Continue other meds as before. Instructions written out for patient.  Current Medication: Outpatient Encounter Medications as of 09/18/2021  Medication Sig   albuterol (VENTOLIN HFA) 108 (90 Base) MCG/ACT inhaler INHALE 1 PUFF INTO THE LUNGS EVERY 6 HOURS AS NEEDED FOR WHEEZING OR SHORTNESS OF BREATH   Alcohol Swabs PADS Use with cleaning area to test blood sugar twice a day   allopurinol (ZYLOPRIM) 100 MG tablet Take 1 tablet (100 mg total) by mouth daily.   Blood Glucose Calibration (TRUE METRIX LEVEL 1) Low SOLN Use as directed to check blood sugar twice a day TRUE METRIX LEVEL 1 CTRL SOLN   escitalopram (LEXAPRO) 10 MG tablet Take 1 tablet (10 mg total) by mouth daily.   losartan-hydrochlorothiazide (HYZAAR) 100-25 MG tablet Take 1 tablet by mouth daily.   meloxicam (MOBIC) 7.5 MG tablet TAKE 1 TABLET(7.5 MG) BY MOUTH DAILY   rosuvastatin (CRESTOR) 5 MG tablet Take 1 tablet (5 mg  total) by mouth daily.   spironolactone (ALDACTONE) 25 MG tablet Take 1 tablet (25 mg total) by mouth daily.   TRUEplus Lancets 30G MISC Use to test blood sugar twice a day   [DISCONTINUED] amLODipine (NORVASC) 10 MG tablet Take 1 tablet (10 mg total) by mouth daily.   [DISCONTINUED] fluticasone (FLONASE) 50 MCG/ACT nasal spray SHAKE LQ AND U 1 TO 2 SPRAYS IEN D FOR RHINITIS   [DISCONTINUED] hydrALAZINE (APRESOLINE) 25 MG tablet TAKE 1 TABLET(25 MG) BY MOUTH THREE TIMES DAILY   [DISCONTINUED] hydrALAZINE (APRESOLINE) 50 MG tablet Take 1 tablet (50 mg total) by mouth 3 (three) times daily.   [DISCONTINUED] omeprazole (PRILOSEC) 40 MG capsule Take 1 capsule (40 mg total) by mouth daily.   No facility-administered encounter medications on file as of 09/18/2021.    Surgical History: Past Surgical History:  Procedure Laterality Date   CAROTID STENT  2010   Vascular bypass graft      Medical History: Past Medical History:  Diagnosis Date   Diabetes mellitus ([place]) 05/22/2015   Gout 05/22/2015   Hypertension     Family History: Family History  Problem Relation Age of Onset   Arthritis Mother     Social History   Socioeconomic History   Marital status: Married    Spouse name: Not on file   Number of children: Not on file   Years of education: Not on file   Highest education level: Not on file  Occupational History   Not  on file  Tobacco Use   Smoking status: Every Day    Packs/day: 1.00    Years: 55.00    Total pack years: 55.00    Types: Cigarettes   Smokeless tobacco: Never  Substance and Sexual Activity   Alcohol use: Yes    Alcohol/week: 18.0 standard drinks of alcohol    Types: 18 Cans of beer per week   Drug use: No   Sexual activity: Not on file  Other Topics Concern   Not on file  Social History Narrative   Not on file   Social Determinants of Health   Financial Resource Strain: Not on file  Food Insecurity: Not on file  Transportation Needs: Not on file   Physical Activity: Not on file  Stress: Not on file  Social Connections: Not on file  Intimate Partner Violence: Not on file      Review of Systems  Constitutional:  Negative for chills, fatigue and unexpected weight change.  HENT:  Negative for congestion, postnasal drip, rhinorrhea, sneezing and sore throat.   Eyes:  Negative for redness.  Respiratory:  Negative for cough, chest tightness and shortness of breath.   Cardiovascular:  Negative for chest pain and palpitations.  Gastrointestinal:  Negative for abdominal pain, constipation, diarrhea, nausea and vomiting.  Genitourinary:  Negative for dysuria and frequency.  Musculoskeletal:  Negative for arthralgias, back pain, joint swelling and neck pain.  Skin:  Negative for rash.  Neurological: Negative.  Negative for tremors and numbness.  Hematological:  Negative for adenopathy. Does not bruise/bleed easily.  Psychiatric/Behavioral:  Negative for behavioral problems (Depression), sleep disturbance and suicidal ideas. The patient is nervous/anxious.     Vital Signs: BP (!) 170/82   Pulse 71   Temp 98 F (36.7 C)   Resp 16   Ht '5\' 7"'$  (1.702 m)   Wt 156 lb (70.8 kg)   SpO2 98%   BMI 24.43 kg/m    Physical Exam Vitals and nursing note reviewed.  Constitutional:      General: He is not in acute distress.    Appearance: He is well-developed and normal weight. He is not diaphoretic.  HENT:     Head: Normocephalic and atraumatic.     Mouth/Throat:     Pharynx: No oropharyngeal exudate.  Eyes:     Pupils: Pupils are equal, round, and reactive to light.  Neck:     Thyroid: No thyromegaly.     Vascular: No JVD.     Trachea: No tracheal deviation.  Cardiovascular:     Rate and Rhythm: Normal rate and regular rhythm.     Heart sounds: Normal heart sounds. No murmur heard.    No friction rub. No gallop.  Pulmonary:     Effort: Pulmonary effort is normal. No respiratory distress.     Breath sounds: No wheezing or rales.   Chest:     Chest wall: No tenderness.  Abdominal:     General: Bowel sounds are normal.     Palpations: Abdomen is soft.  Musculoskeletal:        General: Normal range of motion.     Cervical back: Normal range of motion and neck supple.  Lymphadenopathy:     Cervical: No cervical adenopathy.  Skin:    General: Skin is warm and dry.  Neurological:     Mental Status: He is alert and oriented to person, place, and time.     Cranial Nerves: No cranial nerve deficit.  Psychiatric:  Behavior: Behavior normal.        Thought Content: Thought content normal.        Judgment: Judgment normal.        Assessment/Plan: 1. Uncontrolled hypertension Pt would benefit from HST due to uncontrolled BP despite multiple medications. Will also order renal artery [place] as another potential cause for elevated BP. Will move amlodipine to evening and increase hydralazine to '50mg'$  TID and continue other meds in AM as before. Instructions written out for patient. Advised to check BP and log numbers to bring next visit. - Home sleep test - US Renal; Future - US Renal Artery Stenosis; Future  2. Hypersomnia Will order HST due to uncontrolled BP and daytime fatigue. - Home sleep test  3. GAD (generalized anxiety disorder) May continue lexapro as before   General Counseling: Nyaire verbalizes understanding of the findings of todays visit and agrees with plan of treatment. I have discussed any further diagnostic evaluation that may be needed or ordered today. We also reviewed his medications today. he has been encouraged to call the office with any questions or concerns that should arise related to todays visit.    Orders Placed This Encounter  Procedures   US Renal   US Renal Artery Stenosis   Home sleep test    Meds ordered this encounter  Medications   DISCONTD: hydrALAZINE (APRESOLINE) 50 MG tablet    Sig: Take 1 tablet (50 mg total) by mouth 3 (three) times daily.    Dispense:  90  tablet    Refill:  3    This patient was seen by Drema Dallas, PA-C in collaboration with Dr. Clayborn Bigness as a part of collaborative care agreement.   Total time spent:30 Minutes Time spent includes review of chart, medications, test results, and follow up plan with the patient.      Dr Lavera Guise Internal medicine

## 2021-09-21 ENCOUNTER — Other Ambulatory Visit: Payer: Self-pay

## 2021-09-21 MED ORDER — HYDRALAZINE HCL 50 MG PO TABS: 50.0000 mg | ORAL_TABLET | Freq: Three times a day (TID) | ORAL | 3 refills | Status: DC

## 2021-09-28 ENCOUNTER — Other Ambulatory Visit: Payer: Self-pay

## 2021-09-28 ENCOUNTER — Encounter: Payer: Self-pay | Admitting: Gastroenterology

## 2021-09-28 ENCOUNTER — Ambulatory Visit (INDEPENDENT_AMBULATORY_CARE_PROVIDER_SITE_OTHER): Payer: Medicare HMO | Admitting: Gastroenterology

## 2021-09-28 VITALS — BP 214/113 | HR 82 | Temp 99.2°F | Ht 67.0 in | Wt 158.2 lb

## 2021-09-28 DIAGNOSIS — K861 Other chronic pancreatitis: Secondary | ICD-10-CM | POA: Diagnosis not present

## 2021-09-28 DIAGNOSIS — R195 Other fecal abnormalities: Secondary | ICD-10-CM

## 2021-09-28 DIAGNOSIS — D126 Benign neoplasm of colon, unspecified: Secondary | ICD-10-CM | POA: Insufficient documentation

## 2021-09-28 DIAGNOSIS — R197 Diarrhea, unspecified: Secondary | ICD-10-CM | POA: Insufficient documentation

## 2021-09-28 DIAGNOSIS — K862 Cyst of pancreas: Secondary | ICD-10-CM | POA: Insufficient documentation

## 2021-09-28 DIAGNOSIS — F1010 Alcohol abuse, uncomplicated: Secondary | ICD-10-CM | POA: Insufficient documentation

## 2021-09-28 MED ORDER — ZENPEP 10000-32000 UNITS PO CPEP: ORAL_CAPSULE | ORAL | 11 refills | Status: DC

## 2021-09-28 NOTE — Progress Notes (Signed)
Patient was advised to go to the ED due to his blood pressure being elevated but patient declined. Dr. Tobi Bastos was notified.

## 2021-09-28 NOTE — Progress Notes (Signed)
Wyline Mood MD, MRCP(U.K) [address]  [address]  [place], [place] [address]  Main: [phone]  Fax: [phone]   Primary Care Physician: Carlean Jews, PA-C  Primary Gastroenterologist:  Dr. Wyline Mood   Chief Complaint  Patient presents with  . Pancreatitis    HPI: Donald Scott is a 71 y.o. male   Summary of history :  He is here today to see me for pancreatitis.  Last seen at my office in December 2021.  Previously a patient of [facility] with a prior history of alcohol induced pancreatitis, treated for extrapancreatic insufficiency.MRI back in 2018 demonstrated severe chronic calcific pancreatitis with extensive duct dilation and calcification within the dorsal pancreatic duct.   mild narrowing in the CBD in the vicinity of the pancreatic head but no biliary dilation.  At that point of time he was complaining of rectal bleeding was recommended a colonoscopy but did not proceed with it.    Interval history 2021-09/28/2021 08/17/2021 hemoglobin 12.5 g triglycerides normal TSH normal LFTs normal  Referral letter also suggest frequent bowel movements.  He says the reason he is here today to get refills of his medications for his pancreas.  Denies any change in his bowel habits denies any rectal bleeding.  He says he did not proceed with a colonoscopy last visit had a stool test at home.  Not keen on a colonoscopy at this point of time.  His blood pressure was over 200 systolic.  He states he did not take his medications.  Denies any headaches chest pain shortness of breath.    Current Outpatient Medications  Medication Sig Dispense Refill  . albuterol (VENTOLIN HFA) 108 (90 Base) MCG/ACT inhaler INHALE 1 PUFF INTO THE LUNGS EVERY 6 HOURS AS NEEDED FOR WHEEZING OR SHORTNESS OF BREATH 18 g 3  . Alcohol Swabs PADS Use with cleaning area to test blood sugar twice a day 100 each 3  . allopurinol (ZYLOPRIM) 100 MG tablet Take 1 tablet (100 mg total) by mouth  daily. 90 tablet 1  . amLODipine (NORVASC) 5 MG tablet Take 1 tablet by mouth daily.    . bisoprolol-hydrochlorothiazide (ZIAC) 5-6.25 MG tablet Take 1 tablet by mouth daily.    . Blood Glucose Calibration (TRUE METRIX LEVEL 1) Low SOLN Use as directed to check blood sugar twice a day TRUE METRIX LEVEL 1 CTRL SOLN 1 each 3  . escitalopram (LEXAPRO) 10 MG tablet Take 1 tablet (10 mg total) by mouth daily. 30 tablet 2  . fluticasone (FLONASE) 50 MCG/ACT nasal spray Place 1 spray into both nostrils as needed.    . hydrochlorothiazide (HYDRODIURIL) 25 MG tablet Take 25 mg by mouth 2 (two) times daily.    . lipase/protease/amylase (CREON) 36000 UNITS CPEP capsule Take 1 capsule by mouth daily.    Marland Kitchen losartan (COZAAR) 100 MG tablet Take 100 mg by mouth daily.    Marland Kitchen losartan-hydrochlorothiazide (HYZAAR) 100-25 MG tablet Take 1 tablet by mouth daily. 100 tablet 1  . meloxicam (MOBIC) 7.5 MG tablet TAKE 1 TABLET(7.5 MG) BY MOUTH DAILY 30 tablet 2  . omeprazole (PRILOSEC) 40 MG capsule Take 1 capsule by mouth daily.    Marland Kitchen oxyCODONE-acetaminophen (PERCOCET/ROXICET) 5-325 MG tablet Take 1 tablet by mouth daily.    . rosuvastatin (CRESTOR) 5 MG tablet Take 1 tablet (5 mg total) by mouth daily. 100 tablet 1  . spironolactone (ALDACTONE) 25 MG tablet Take 1 tablet (25 mg total) by mouth daily. 90 tablet  3  . TRUEplus Lancets 30G MISC Use to test blood sugar twice a day 100 each 3   No current facility-administered medications for this visit.    Allergies as of 09/28/2021 - Review Complete 09/28/2021  Allergen Reaction Noted  . Iodinated contrast media Other (See Comments) 05/22/2015  . Other Hives, Other (See Comments), and Rash 05/22/2015  . Penicillin g Other (See Comments) 05/22/2015    ROS:  General: Negative for anorexia, weight loss, fever, chills, fatigue, weakness. ENT: Negative for hoarseness, difficulty swallowing , nasal congestion. CV: Negative for chest pain, angina, palpitations, dyspnea  on exertion, peripheral edema.  Respiratory: Negative for dyspnea at rest, dyspnea on exertion, cough, sputum, wheezing.  GI: See history of present illness. GU:  Negative for dysuria, hematuria, urinary incontinence, urinary frequency, nocturnal urination.  Endo: Negative for unusual weight change.    Physical Examination:   BP (!) 221/103   Pulse 88   Temp 99.2 F (37.3 C) (Oral)   Ht 5\' 7"  (1.702 m)   Wt 158 lb 3.2 oz (71.8 kg)   BMI 24.78 kg/m   General: Well-nourished, well-developed in no acute distress.  Eyes: No icterus. Conjunctivae pink. Neuro: Alert and oriented x 3.  Grossly intact. Skin: Warm and dry, no jaundice.   Psych: Alert and cooperative, normal mood and affect.   Imaging Studies: No results found.  Assessment and Plan:   Donald Scott is a 71 y.o. 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  Seen back at my office in 2021 for colonoscopy due to rectal bleeding did not proceed with the same.  Presently referred for frequent bowel movements and chronic pancreatitis.  He denies any change of bowel habits himself.  He states he is only here to get a refill of his pancreas medications.  Not keen on a colonoscopy.  Discussed the risks versus benefits of doing so.  No other active problems.  Plan 1.  Very high blood pressure with systolic over 220 mmHg I explained to him that he should go to the emergency room.  He said he did not take his medications.  I explained that this would be classified as hypertensive urgency.  2.  We will give him a sample of Zenpep I advised him if it makes him feel better in a week's time to give [place] a call and we will give him a prescription     I have discussed alternative options, risks & benefits,  which include, but are not limited to, bleeding, infection, perforation,respiratory complication & drug reaction.  The patient agrees with this plan &  written consent will be obtained.       Dr Wyline Mood  MD,MRCP [facility]) Follow up in as needed

## 2021-09-29 ENCOUNTER — Telehealth: Payer: Self-pay

## 2021-09-29 MED ORDER — ZENPEP 10000-32000 UNITS PO CPEP: ORAL_CAPSULE | ORAL | 11 refills | Status: DC

## 2021-09-29 NOTE — Telephone Encounter (Signed)
A new prescription was sent to patient's pharmacy with new instructions.

## 2021-10-12 ENCOUNTER — Other Ambulatory Visit: Payer: Medicaid Other

## 2021-10-13 ENCOUNTER — Ambulatory Visit
Admission: RE | Admit: 2021-10-13 | Discharge: 2021-10-13 | Disposition: A | Discharge status: Home / Self Care | Payer: Medicare HMO | Source: Ambulatory Visit | Attending: Physician Assistant | Admitting: Physician Assistant

## 2021-10-13 ENCOUNTER — Other Ambulatory Visit: Payer: Self-pay | Admitting: Physician Assistant

## 2021-10-13 DIAGNOSIS — Z79899 Other long term (current) drug therapy: Secondary | ICD-10-CM | POA: Diagnosis not present

## 2021-10-13 DIAGNOSIS — I10 Essential (primary) hypertension: Secondary | ICD-10-CM | POA: Diagnosis present

## 2021-10-13 DIAGNOSIS — G8929 Other chronic pain: Secondary | ICD-10-CM

## 2021-10-16 ENCOUNTER — Telehealth: Payer: Self-pay | Admitting: Acute Care

## 2021-10-16 NOTE — Telephone Encounter (Signed)
Attempted to reach pt to schedule annual LDCT-attempted to leave VM but mailbox has not been set up.

## 2021-10-19 ENCOUNTER — Other Ambulatory Visit: Payer: Medicare HMO

## 2021-10-20 ENCOUNTER — Other Ambulatory Visit: Payer: Self-pay | Admitting: Physician Assistant

## 2021-10-20 DIAGNOSIS — F411 Generalized anxiety disorder: Secondary | ICD-10-CM

## 2021-11-12 ENCOUNTER — Emergency Department: Payer: Medicare HMO

## 2021-11-12 ENCOUNTER — Emergency Department
Admission: EM | Admit: 2021-11-12 | Discharge: 2021-11-12 | Disposition: A | Discharge status: Home / Self Care | Payer: Medicare HMO | Attending: Emergency Medicine | Admitting: Emergency Medicine

## 2021-11-12 ENCOUNTER — Other Ambulatory Visit: Payer: Self-pay

## 2021-11-12 ENCOUNTER — Encounter: Payer: Self-pay | Admitting: Emergency Medicine

## 2021-11-12 DIAGNOSIS — Z20822 Contact with and (suspected) exposure to covid-19: Secondary | ICD-10-CM | POA: Insufficient documentation

## 2021-11-12 DIAGNOSIS — J40 Bronchitis, not specified as acute or chronic: Secondary | ICD-10-CM | POA: Insufficient documentation

## 2021-11-12 DIAGNOSIS — R112 Nausea with vomiting, unspecified: Secondary | ICD-10-CM | POA: Insufficient documentation

## 2021-11-12 DIAGNOSIS — R197 Diarrhea, unspecified: Secondary | ICD-10-CM | POA: Diagnosis not present

## 2021-11-12 DIAGNOSIS — I10 Essential (primary) hypertension: Secondary | ICD-10-CM | POA: Insufficient documentation

## 2021-11-12 DIAGNOSIS — J019 Acute sinusitis, unspecified: Secondary | ICD-10-CM | POA: Insufficient documentation

## 2021-11-12 DIAGNOSIS — E119 Type 2 diabetes mellitus without complications: Secondary | ICD-10-CM | POA: Insufficient documentation

## 2021-11-12 DIAGNOSIS — R0981 Nasal congestion: Secondary | ICD-10-CM | POA: Diagnosis present

## 2021-11-12 LAB — CBC WITH DIFFERENTIAL/PLATELET
Abs Immature Granulocytes: 0.06 10*3/uL (ref 0.00–0.07)
Basophils Absolute: 0 10*3/uL (ref 0.0–0.1)
Basophils Relative: 0 %
Eosinophils Absolute: 0.1 10*3/uL (ref 0.0–0.5)
Eosinophils Relative: 1 %
HCT: 39 % (ref 39.0–52.0)
Hemoglobin: 12.7 g/dL — ABNORMAL LOW (ref 13.0–17.0)
Immature Granulocytes: 1 %
Lymphocytes Relative: 19 %
Lymphs Abs: 2 10*3/uL (ref 0.7–4.0)
MCH: 30 pg (ref 26.0–34.0)
MCHC: 32.6 g/dL (ref 30.0–36.0)
MCV: 92.2 fL (ref 80.0–100.0)
Monocytes Absolute: 0.7 10*3/uL (ref 0.1–1.0)
Monocytes Relative: 6 %
Neutro Abs: 7.7 10*3/uL (ref 1.7–7.7)
Neutrophils Relative %: 73 %
Platelets: 241 10*3/uL (ref 150–400)
RBC: 4.23 MIL/uL (ref 4.22–5.81)
RDW: 12.3 % (ref 11.5–15.5)
WBC: 10.5 10*3/uL (ref 4.0–10.5)
nRBC: 0 % (ref 0.0–0.2)

## 2021-11-12 LAB — BASIC METABOLIC PANEL
Anion gap: 8 (ref 5–15)
BUN: 25 mg/dL — ABNORMAL HIGH (ref 8–23)
CO2: 24 mmol/L (ref 22–32)
Calcium: 8.8 mg/dL — ABNORMAL LOW (ref 8.9–10.3)
Chloride: 101 mmol/L (ref 98–111)
Creatinine, Ser: 1.23 mg/dL (ref 0.61–1.24)
GFR, Estimated: >60 mL/min (ref >60)
Glucose, Bld: 127 mg/dL — ABNORMAL HIGH (ref 70–99)
Potassium: 3.3 mmol/L — ABNORMAL LOW (ref 3.5–5.1)
Sodium: 133 mmol/L — ABNORMAL LOW (ref 135–145)

## 2021-11-12 LAB — SARS CORONAVIRUS 2 BY RT PCR: SARS Coronavirus 2 by RT PCR: NEGATIVE

## 2021-11-12 MED ORDER — CEFDINIR 300 MG PO CAPS: 300.0000 mg | ORAL_CAPSULE | Freq: Two times a day (BID) | ORAL | 0 refills | Status: AC

## 2021-11-12 NOTE — ED Provider Notes (Signed)
[facility] Provider Note    Event Date/Time   First MD Initiated Contact with Patient 11/12/21 1403     (approximate)   History   Chief Complaint Facial Pain (/) and Nasal Congestion   HPI  Donald Scott is a 71 y.o. male with past medical history of hypertension, diabetes, gout, and alcohol abuse who presents to the ED complaining of cough.  Patient reports that he has been feeling congested with a cough for about the past week.  He denies any fevers or chills, does state his cough has been productive of greenish sputum.  He denies any pain in his chest or difficulty breathing, does state he has been feeling nauseous with occasional vomiting and diarrhea.  He has not had any pain in his abdomen and denies any dysuria, hematuria, or flank pain.  He reports his cousin is sick with similar symptoms and recently tested positive for COVID-19.     Physical Exam   Triage Vital Signs: ED Triage Vitals  Enc Vitals Group     BP 11/12/21 1312 (!) 175/96     Pulse Rate 11/12/21 1312 76     Resp 11/12/21 1312 16     Temp 11/12/21 1312 98.7 F (37.1 C)     Temp Source 11/12/21 1312 Oral     SpO2 11/12/21 1312 98 %     Weight 11/12/21 1313 159 lb (72.1 kg)     Height 11/12/21 1313 '5\' 7"'$  (1.702 m)     Head Circumference --      Peak Flow --      Pain Score 11/12/21 1311 0     Pain Loc --      Pain Edu? --      Excl. in [place]? --     Most recent vital signs: Vitals:   11/12/21 1312  BP: (!) 175/96  Pulse: 76  Resp: 16  Temp: 98.7 F (37.1 C)  SpO2: 98%    Constitutional: Alert and oriented. Eyes: Conjunctivae are normal. Head: Atraumatic. Nose: No congestion/rhinnorhea. Mouth/Throat: Mucous membranes are moist.  Cardiovascular: Normal rate, regular rhythm. Grossly normal heart sounds.  2+ radial pulses bilaterally. Respiratory: Normal respiratory effort.  No retractions. Lungs CTAB. Gastrointestinal: Soft and nontender. No  distention. Musculoskeletal: No lower extremity tenderness nor edema.  Neurologic:  Normal speech and language. No gross focal neurologic deficits are appreciated.    ED Results / Procedures / Treatments   Labs (all labs ordered are listed, but only abnormal results are displayed) Labs Reviewed  CBC WITH DIFFERENTIAL/PLATELET - Abnormal; Notable for the following components:      Result Value   Hemoglobin 12.7 (*)    All other components within normal limits  BASIC METABOLIC PANEL - Abnormal; Notable for the following components:   Sodium 133 (*)    Potassium 3.3 (*)    Glucose, Bld 127 (*)    BUN 25 (*)    Calcium 8.8 (*)    All other components within normal limits  SARS CORONAVIRUS 2 BY RT PCR     EKG  ED ECG REPORT I, Blake Divine, the attending physician, personally viewed and interpreted this ECG.   Date: 11/12/2021  EKG Time: 14:43  Rate: 72  Rhythm: normal sinus rhythm  Axis: Normal  Intervals:none  ST&T Change: None  RADIOLOGY Chest x-ray reviewed and interpreted by me with no infiltrate, edema, or effusion.  PROCEDURES:  Critical Care performed: No  Procedures   MEDICATIONS ORDERED IN  ED: Medications - No data to display   IMPRESSION / MDM / [place] / ED COURSE  I reviewed the triage vital signs and the nursing notes.                              71 y.o. male with past medical history of hypertension, diabetes, gout, and alcohol abuse who presents to the ED complaining of 1 week of nausea, vomiting, diarrhea, productive cough, and congestion.  Patient's presentation is most consistent with acute presentation with potential threat to life or bodily function.  Differential diagnosis includes, but is not limited to, pneumonia, bronchitis, COVID-19, dehydration, electrolyte abnormality, AKI.  Patient nontoxic-appearing and in no acute distress, vital signs are unremarkable.  He is not in any respiratory distress and is maintaining  oxygen saturations on room air.  Lungs are clear to auscultation bilaterally.  Testing for COVID-19 is negative and we will further assess with chest x-ray for evidence of pneumonia.  Given his week of vomiting and diarrhea intermittently, we will also screen basic labs for electrolyte abnormality, AKI, or anemia.  Chest x-ray is unremarkable and labs are reassuring with no significant anemia, leukocytosis, electrolyte abnormality, or AKI.  On reassessment, patient is primarily complaining of pain around his frontal and maxillary sinuses with green drainage from his nose.  He is appropriate for outpatient management of bronchitis with Mucinex and inhaler, will prescribe cefdinir for his sinusitis given penicillin allergy.  Patient counseled to return to the ED for new or worsening symptoms, patient agrees with plan.      FINAL CLINICAL IMPRESSION(S) / ED DIAGNOSES   Final diagnoses:  Bronchitis  Acute non-recurrent sinusitis, unspecified location     Rx / DC Orders   ED Discharge Orders          Ordered    cefdinir (OMNICEF) 300 MG capsule  2 times daily        11/12/21 1700             Note:  This document was prepared using Dragon voice recognition software and may include unintentional dictation errors.   Blake Divine, MD 11/12/21 [phone]

## 2021-11-12 NOTE — ED Triage Notes (Signed)
Pt to ED via POV, pt states that he has sinus pressure and congestion. Pt is currently in NAD.

## 2021-11-16 ENCOUNTER — Other Ambulatory Visit: Payer: Medicare HMO

## 2021-11-26 ENCOUNTER — Encounter: Payer: Self-pay | Admitting: Physician Assistant

## 2021-11-26 ENCOUNTER — Ambulatory Visit (INDEPENDENT_AMBULATORY_CARE_PROVIDER_SITE_OTHER): Payer: Medicare HMO | Admitting: Physician Assistant

## 2021-11-26 DIAGNOSIS — F411 Generalized anxiety disorder: Secondary | ICD-10-CM

## 2021-11-26 DIAGNOSIS — D649 Anemia, unspecified: Secondary | ICD-10-CM | POA: Diagnosis not present

## 2021-11-26 DIAGNOSIS — I10 Essential (primary) hypertension: Secondary | ICD-10-CM

## 2021-11-26 DIAGNOSIS — E876 Hypokalemia: Secondary | ICD-10-CM | POA: Diagnosis not present

## 2021-11-26 NOTE — Progress Notes (Signed)
[facility] [place], [place] [address]  Internal MEDICINE  Office Visit Note  Patient Name: Donald Scott  [id number]  [id number]  Date of Service: 12/03/2021  Chief Complaint  Patient presents with   Follow-up    U/s    HPI Pt is here for routine follow up to review renal artery [place] -He is recovering from bronchitis 2 weeks ago that sent him to the ED. -Labs done in ED show low potassium, calcium, and sodium, as well as slightly low hemoglobin and will recheck these labs. Discussed increasing calcium intake. He thinks these may have been low due to not feeling well -BP at home has fluctuated still, was higher when he first felt sick, but getting better now. Never heard about sleep study. -In the process of making an appt to have teeth pulled -Reviewed renal artery [place] and was normal without signs of RAS -Pt reports receiving frequent med refills and there seem to be duplicates of meds on list now and he is unsure exactly which formulations of the meds he received most recently--will call pharmacy to clarify. Advised to bring meds next visit as well.  Current Medication: Outpatient Encounter Medications as of 11/26/2021  Medication Sig   albuterol (VENTOLIN HFA) 108 (90 Base) MCG/ACT inhaler INHALE 1 PUFF INTO THE LUNGS EVERY 6 HOURS AS NEEDED FOR WHEEZING OR SHORTNESS OF BREATH   Alcohol Swabs PADS Use with cleaning area to test blood sugar twice a day   allopurinol (ZYLOPRIM) 100 MG tablet Take 1 tablet (100 mg total) by mouth daily.   amLODipine (NORVASC) 5 MG tablet Take 1 tablet by mouth daily.   bisoprolol-hydrochlorothiazide (ZIAC) 5-6.25 MG tablet Take 1 tablet by mouth daily.   Blood Glucose Calibration (TRUE METRIX LEVEL 1) Low SOLN Use as directed to check blood sugar twice a day TRUE METRIX LEVEL 1 CTRL SOLN   escitalopram (LEXAPRO) 10 MG tablet TAKE 1 TABLET(10 MG) BY MOUTH DAILY   fluticasone (FLONASE) 50 MCG/ACT nasal spray Place 1 spray  into both nostrils as needed.   hydrochlorothiazide (HYDRODIURIL) 25 MG tablet Take 25 mg by mouth 2 (two) times daily.   losartan (COZAAR) 100 MG tablet Take 100 mg by mouth daily.   losartan-hydrochlorothiazide (HYZAAR) 100-25 MG tablet Take 1 tablet by mouth daily.   meloxicam (MOBIC) 7.5 MG tablet TAKE 1 TABLET(7.5 MG) BY MOUTH DAILY   omeprazole (PRILOSEC) 40 MG capsule Take 1 capsule by mouth daily.   oxyCODONE-acetaminophen (PERCOCET/ROXICET) 5-325 MG tablet Take 1 tablet by mouth daily.   Pancrelipase, Lip-Prot-Amyl, (ZENPEP) 10000-32000 units CPEP Take 70,000 units every meal and take 30,000 units every snack.   rosuvastatin (CRESTOR) 5 MG tablet Take 1 tablet (5 mg total) by mouth daily.   spironolactone (ALDACTONE) 25 MG tablet Take 1 tablet (25 mg total) by mouth daily.   TRUEplus Lancets 30G MISC Use to test blood sugar twice a day   No facility-administered encounter medications on file as of 11/26/2021.    Surgical History: Past Surgical History:  Procedure Laterality Date   CAROTID STENT  2010   Vascular bypass graft      Medical History: Past Medical History:  Diagnosis Date   Diabetes mellitus ([place]) 05/22/2015   Gout 05/22/2015   Hypertension     Family History: Family History  Problem Relation Age of Onset   Arthritis Mother     Social History   Socioeconomic History   Marital status: Married    Spouse name:  Not on file   Number of children: Not on file   Years of education: Not on file   Highest education level: Not on file  Occupational History   Not on file  Tobacco Use   Smoking status: Every Day    Packs/day: 1.00    Years: 55.00    Total pack years: 55.00    Types: Cigarettes   Smokeless tobacco: Never  Substance and Sexual Activity   Alcohol use: Yes    Alcohol/week: 18.0 standard drinks of alcohol    Types: 18 Cans of beer per week   Drug use: No   Sexual activity: Not on file  Other Topics Concern   Not on file  Social History  Narrative   Not on file   Social Determinants of Health   Financial Resource Strain: Not on file  Food Insecurity: Not on file  Transportation Needs: Not on file  Physical Activity: Not on file  Stress: Not on file  Social Connections: Not on file  Intimate Partner Violence: Not on file      Review of Systems  Constitutional:  Negative for chills, fatigue and unexpected weight change.  HENT:  Negative for congestion, postnasal drip, rhinorrhea, sneezing and sore throat.   Eyes:  Negative for redness.  Respiratory:  Positive for cough. Negative for chest tightness and shortness of breath.   Cardiovascular:  Negative for chest pain and palpitations.  Gastrointestinal:  Negative for abdominal pain, constipation, diarrhea, nausea and vomiting.  Genitourinary:  Negative for dysuria and frequency.  Musculoskeletal:  Negative for arthralgias, back pain, joint swelling and neck pain.  Skin:  Negative for rash.  Neurological: Negative.  Negative for tremors and numbness.  Hematological:  Negative for adenopathy. Does not bruise/bleed easily.  Psychiatric/Behavioral:  Negative for behavioral problems (Depression), sleep disturbance and suicidal ideas. The patient is nervous/anxious.     Vital Signs: BP (!) 140/80   Pulse 60   Temp 97.8 F (36.6 C)   Resp 16   Ht '5\' 7"'$  (1.702 m)   Wt 140 lb (63.5 kg)   SpO2 99%   BMI 21.93 kg/m    Physical Exam Vitals and nursing note reviewed.  Constitutional:      General: He is not in acute distress.    Appearance: He is well-developed and normal weight. He is not diaphoretic.  HENT:     Head: Normocephalic and atraumatic.     Mouth/Throat:     Pharynx: No oropharyngeal exudate.  Eyes:     Pupils: Pupils are equal, round, and reactive to light.  Neck:     Thyroid: No thyromegaly.     Vascular: No JVD.     Trachea: No tracheal deviation.  Cardiovascular:     Rate and Rhythm: Normal rate and regular rhythm.     Heart sounds: Normal  heart sounds. No murmur heard.    No friction rub. No gallop.  Pulmonary:     Effort: Pulmonary effort is normal. No respiratory distress.     Breath sounds: No wheezing or rales.  Chest:     Chest wall: No tenderness.  Abdominal:     General: Bowel sounds are normal.     Palpations: Abdomen is soft.  Musculoskeletal:        General: Normal range of motion.     Cervical back: Normal range of motion and neck supple.  Lymphadenopathy:     Cervical: No cervical adenopathy.  Skin:    General: Skin is warm  and dry.  Neurological:     Mental Status: He is alert and oriented to person, place, and time.     Cranial Nerves: No cranial nerve deficit.  Psychiatric:        Behavior: Behavior normal.        Thought Content: Thought content normal.        Judgment: Judgment normal.        Assessment/Plan: 1. Essential hypertension Improving, will call pharmacy to clarify meds and have pt bring all meds next visit. Continue monitoring  2. GAD (generalized anxiety disorder) Stable, continue lexapro  3. Hypokalemia Will recheck labs - Comprehensive metabolic panel  4. Low hemoglobin Will recheck labs - CBC w/Diff/Platelet   General Counseling: Tyvon verbalizes understanding of the findings of todays visit and agrees with plan of treatment. I have discussed any further diagnostic evaluation that may be needed or ordered today. We also reviewed his medications today. he has been encouraged to call the office with any questions or concerns that should arise related to todays visit.    Orders Placed This Encounter  Procedures   CBC w/Diff/Platelet   Comprehensive metabolic panel    No orders of the defined types were placed in this encounter.   This patient was seen by Drema Dallas, PA-C in collaboration with Dr. Clayborn Bigness as a part of collaborative care agreement.   Total time spent:30 Minutes Time spent includes review of chart, medications, test results, and follow  up plan with the patient.      Dr Lavera Guise Internal medicine

## 2021-12-04 ENCOUNTER — Telehealth: Payer: Self-pay

## 2021-12-04 NOTE — Telephone Encounter (Signed)
Spoke to pt to go over his medications he is taking and he wasn't at home at the moment.  I advised pt that I can call him back Tuesday 12/08/21 and go over the medications he has in bottles he is taking and then see what Lauren needs him to do.  Pt agreed

## 2021-12-04 NOTE — Telephone Encounter (Signed)
-----   Message from Mylinda Latina, PA-C sent at 12/03/2021  6:15 PM EDT ----- Marykay Lex can one of you please call pharmacy to confirm his current meds when you have a moment. He has a lot of duplicates/overlapping combination pills on his list now and I think things got messed up when he went to the ED, but he also reports getting a lot of medications frequently and seems like they are being filled too soon. If you can clean up his list I would appreciate it. I have also asked him to bring all his meds next visit----but who knows if he will.  Thanks!

## 2022-01-25 ENCOUNTER — Ambulatory Visit (INDEPENDENT_AMBULATORY_CARE_PROVIDER_SITE_OTHER): Payer: Medicare HMO | Admitting: Physician Assistant

## 2022-01-25 ENCOUNTER — Encounter: Payer: Self-pay | Admitting: Physician Assistant

## 2022-01-25 VITALS — BP 190/98 | HR 72 | Temp 98.5°F | Resp 16 | Ht 67.0 in | Wt 158.0 lb

## 2022-01-25 DIAGNOSIS — F411 Generalized anxiety disorder: Secondary | ICD-10-CM | POA: Diagnosis not present

## 2022-01-25 DIAGNOSIS — M79604 Pain in right leg: Secondary | ICD-10-CM

## 2022-01-25 DIAGNOSIS — Z23 Encounter for immunization: Secondary | ICD-10-CM | POA: Diagnosis not present

## 2022-01-25 DIAGNOSIS — I10 Essential (primary) hypertension: Secondary | ICD-10-CM | POA: Diagnosis not present

## 2022-01-25 DIAGNOSIS — M7989 Other specified soft tissue disorders: Secondary | ICD-10-CM | POA: Diagnosis not present

## 2022-01-25 DIAGNOSIS — E1159 Type 2 diabetes mellitus with other circulatory complications: Secondary | ICD-10-CM | POA: Diagnosis not present

## 2022-01-25 DIAGNOSIS — I700 Atherosclerosis of aorta: Secondary | ICD-10-CM

## 2022-01-25 LAB — POCT GLYCOSYLATED HEMOGLOBIN (HGB A1C): Hemoglobin A1C: 6.3 % — AB (ref 4.0–5.6)

## 2022-01-25 MED ORDER — AMLODIPINE BESYLATE 5 MG PO TABS: 5.0000 mg | ORAL_TABLET | Freq: Every day | ORAL | 1 refills | Status: DC

## 2022-01-25 MED ORDER — LOSARTAN POTASSIUM-HCTZ 100-25 MG PO TABS: 1.0000 | ORAL_TABLET | Freq: Every day | ORAL | 1 refills | Status: DC

## 2022-01-25 MED ORDER — ROSUVASTATIN CALCIUM 5 MG PO TABS: 5.0000 mg | ORAL_TABLET | Freq: Every day | ORAL | 1 refills | Status: DC

## 2022-01-25 MED ORDER — ESCITALOPRAM OXALATE 10 MG PO TABS: ORAL_TABLET | ORAL | 2 refills | Status: DC

## 2022-01-25 NOTE — Progress Notes (Signed)
[facility] [place], [place] [address]  Internal MEDICINE  Office Visit Note  Patient Name: Donald Scott  [id number]  [id number]  Date of Service: 02/07/2022  Chief Complaint  Patient presents with   Follow-up   Diabetes   Hypertension    HPI Pt is here for routine follow up -Some sinus and allergies symptoms recently. Thinks this drives his BP up some. Using nasal spray. -Reports BP fluctuating at home still, but not too high. Doesn't recall actual readings. -he was instructed to bring all meds today due to concern he wasn't taking everything as prescribed, especially after ED visit a few months ago where his med list was altered. -losartan-hctz and amlodipine both missing from medication he brought in office today and likely explains why BP rising further. He is taking hydralazine and spironolactone still. Will resend his medications and have a med list printed so he is aware of all the meds he should be taking. -Knot along upper right leg, a little tender. Reports a tow dolley ran across upper legs, which weighs ~160lbs a few weeks ago. His left leg is back to normal, but right leg continues to have area of tenderness and a knot along inner thigh. No visible bruising. Has been tolerating pain ok and walking ok, but is worried he burst a blood vessel and decided it was time to have it looked at since he never was evaluated after the incident. No change in sensation. He does mention a hx of being told he needed stents in legs, got them in left leg but not right. Told narrowing/blockages of arteries. He didn't get the right side done, because of all the staples done on left side and didn't want to go through that again.  Current Medication: Outpatient Encounter Medications as of 01/25/2022  Medication Sig   albuterol (VENTOLIN HFA) 108 (90 Base) MCG/ACT inhaler INHALE 1 PUFF INTO THE LUNGS EVERY 6 HOURS AS NEEDED FOR WHEEZING OR SHORTNESS OF BREATH   Alcohol  Swabs PADS Use with cleaning area to test blood sugar twice a day   allopurinol (ZYLOPRIM) 100 MG tablet Take 1 tablet (100 mg total) by mouth daily.   Blood Glucose Calibration (TRUE METRIX LEVEL 1) Low SOLN Use as directed to check blood sugar twice a day TRUE METRIX LEVEL 1 CTRL SOLN   fluticasone (FLONASE) 50 MCG/ACT nasal spray Place 1 spray into both nostrils as needed.   hydrALAZINE (APRESOLINE) 50 MG tablet Take 50 mg by mouth 3 (three) times daily.   meloxicam (MOBIC) 7.5 MG tablet TAKE 1 TABLET(7.5 MG) BY MOUTH DAILY   omeprazole (PRILOSEC) 40 MG capsule Take 1 capsule by mouth daily.   Pancrelipase, Lip-Prot-Amyl, (ZENPEP) 10000-32000 units CPEP Take 70,000 units every meal and take 30,000 units every snack.   spironolactone (ALDACTONE) 25 MG tablet Take 1 tablet (25 mg total) by mouth daily.   TRUEplus Lancets 30G MISC Use to test blood sugar twice a day   [DISCONTINUED] amLODipine (NORVASC) 5 MG tablet Take 1 tablet by mouth daily.   [DISCONTINUED] bisoprolol-hydrochlorothiazide (ZIAC) 5-6.25 MG tablet Take 1 tablet by mouth daily.   [DISCONTINUED] escitalopram (LEXAPRO) 10 MG tablet TAKE 1 TABLET(10 MG) BY MOUTH DAILY   [DISCONTINUED] hydrochlorothiazide (HYDRODIURIL) 25 MG tablet Take 25 mg by mouth 2 (two) times daily.   [DISCONTINUED] losartan (COZAAR) 100 MG tablet Take 100 mg by mouth daily.   [DISCONTINUED] losartan-hydrochlorothiazide (HYZAAR) 100-25 MG tablet Take 1 tablet by mouth daily.   [  DISCONTINUED] oxyCODONE-acetaminophen (PERCOCET/ROXICET) 5-325 MG tablet Take 1 tablet by mouth daily.   [DISCONTINUED] rosuvastatin (CRESTOR) 5 MG tablet Take 1 tablet (5 mg total) by mouth daily.   amLODipine (NORVASC) 5 MG tablet Take 1 tablet (5 mg total) by mouth daily.   escitalopram (LEXAPRO) 10 MG tablet TAKE 1 TABLET(10 MG) BY MOUTH DAILY   losartan-hydrochlorothiazide (HYZAAR) 100-25 MG tablet Take 1 tablet by mouth daily.   rosuvastatin (CRESTOR) 5 MG tablet Take 1 tablet (5  mg total) by mouth daily.   No facility-administered encounter medications on file as of 01/25/2022.    Surgical History: Past Surgical History:  Procedure Laterality Date   CAROTID STENT  2010   Vascular bypass graft      Medical History: Past Medical History:  Diagnosis Date   Diabetes mellitus ([place]) 05/22/2015   Gout 05/22/2015   Hypertension     Family History: Family History  Problem Relation Age of Onset   Arthritis Mother     Social History   Socioeconomic History   Marital status: Married    Spouse name: Not on file   Number of children: Not on file   Years of education: Not on file   Highest education level: Not on file  Occupational History   Not on file  Tobacco Use   Smoking status: Every Day    Packs/day: 1.00    Years: 55.00    Total pack years: 55.00    Types: Cigarettes   Smokeless tobacco: Never  Substance and Sexual Activity   Alcohol use: Yes    Alcohol/week: 18.0 standard drinks of alcohol    Types: 18 Cans of beer per week   Drug use: No   Sexual activity: Not on file  Other Topics Concern   Not on file  Social History Narrative   Not on file   Social Determinants of Health   Financial Resource Strain: Not on file  Food Insecurity: Not on file  Transportation Needs: Not on file  Physical Activity: Not on file  Stress: Not on file  Social Connections: Not on file  Intimate Partner Violence: Not on file      Review of Systems  Constitutional:  Negative for chills, fatigue and unexpected weight change.  HENT:  Positive for postnasal drip. Negative for congestion, rhinorrhea, sneezing and sore throat.   Eyes:  Negative for redness.  Respiratory:  Negative for cough, chest tightness and shortness of breath.   Cardiovascular:  Negative for chest pain and palpitations.  Gastrointestinal:  Negative for abdominal pain, constipation, diarrhea, nausea and vomiting.  Genitourinary:  Negative for dysuria and frequency.  Musculoskeletal:   Positive for myalgias. Negative for arthralgias, back pain, joint swelling and neck pain.  Skin:  Negative for rash.  Allergic/Immunologic: Positive for environmental allergies.  Neurological: Negative.  Negative for tremors and numbness.  Hematological:  Negative for adenopathy. Does not bruise/bleed easily.  Psychiatric/Behavioral:  Negative for behavioral problems (Depression), sleep disturbance and suicidal ideas. The patient is nervous/anxious.     Vital Signs: BP (!) 190/98 Comment: 220/101  Pulse 72   Temp 98.5 F (36.9 C)   Resp 16   Ht '5\' 7"'$  (1.702 m)   Wt 158 lb (71.7 kg)   SpO2 99%   BMI 24.75 kg/m    Physical Exam Vitals and nursing note reviewed.  Constitutional:      General: He is not in acute distress.    Appearance: He is well-developed and normal weight. He is not  diaphoretic.  HENT:     Head: Normocephalic and atraumatic.     Mouth/Throat:     Pharynx: No oropharyngeal exudate.  Eyes:     Pupils: Pupils are equal, round, and reactive to light.  Neck:     Thyroid: No thyromegaly.     Vascular: No JVD.     Trachea: No tracheal deviation.  Cardiovascular:     Rate and Rhythm: Normal rate and regular rhythm.     Heart sounds: Normal heart sounds. No murmur heard.    No friction rub. No gallop.  Pulmonary:     Effort: Pulmonary effort is normal. No respiratory distress.     Breath sounds: No wheezing or rales.  Chest:     Chest wall: No tenderness.  Abdominal:     General: Bowel sounds are normal.     Palpations: Abdomen is soft.  Musculoskeletal:        General: Swelling and signs of injury present. Normal range of motion.     Cervical back: Normal range of motion and neck supple.     Comments: Knot along right upper thigh following injury  Lymphadenopathy:     Cervical: No cervical adenopathy.  Skin:    General: Skin is warm and dry.  Neurological:     Mental Status: He is alert and oriented to person, place, and time.     Cranial Nerves: No  cranial nerve deficit.  Psychiatric:        Behavior: Behavior normal.        Thought Content: Thought content normal.        Judgment: Judgment normal.        Assessment/Plan: 1. Uncontrolled hypertension Will resend hyzaarr and amlodipine and continue hydralazine and spironolactone as before. Given med list to keep track of his meds and not miss any. Advised to continue monitoring BP. - losartan-hydrochlorothiazide (HYZAAR) 100-25 MG tablet; Take 1 tablet by mouth daily.  Dispense: 90 tablet; Refill: 1 - amLODipine (NORVASC) 5 MG tablet; Take 1 tablet (5 mg total) by mouth daily.  Dispense: 90 tablet; Refill: 1  2. Type 2 diabetes mellitus with other circulatory complication, without long-term current use of insulin (HCC) - POCT HgB A1C is 6.3 which is up from 5.7 last visit. Will work on diet and exercise.  3. Aortic atherosclerosis (HCC) - rosuvastatin (CRESTOR) 5 MG tablet; Take 1 tablet (5 mg total) by mouth daily.  Dispense: 90 tablet; Refill: 1  4. GAD (generalized anxiety disorder) - escitalopram (LEXAPRO) 10 MG tablet; TAKE 1 TABLET(10 MG) BY MOUTH DAILY  Dispense: 90 tablet; Refill: 2  5. Pain and swelling of right lower extremity Will obtain US, likely swelling from injury however need to rule out DVT. Pt advised to call or go to ED if any new or worsening symptoms - VAS [place] LOWER EXTREMITY VENOUS (DVT); Future  6. Flu vaccine need - Flu Vaccine MDCK QUAD PF   General Counseling: Chidi verbalizes understanding of the findings of todays visit and agrees with plan of treatment. I have discussed any further diagnostic evaluation that may be needed or ordered today. We also reviewed his medications today. he has been encouraged to call the office with any questions or concerns that should arise related to todays visit.    Orders Placed This Encounter  Procedures   Flu Vaccine MDCK QUAD PF   POCT HgB A1C   VAS [place] LOWER EXTREMITY VENOUS (DVT)    Meds ordered this  encounter  Medications  losartan-hydrochlorothiazide (HYZAAR) 100-25 MG tablet    Sig: Take 1 tablet by mouth daily.    Dispense:  90 tablet    Refill:  1   rosuvastatin (CRESTOR) 5 MG tablet    Sig: Take 1 tablet (5 mg total) by mouth daily.    Dispense:  90 tablet    Refill:  1   escitalopram (LEXAPRO) 10 MG tablet    Sig: TAKE 1 TABLET(10 MG) BY MOUTH DAILY    Dispense:  90 tablet    Refill:  2   amLODipine (NORVASC) 5 MG tablet    Sig: Take 1 tablet (5 mg total) by mouth daily.    Dispense:  90 tablet    Refill:  1    This patient was seen by Drema Dallas, PA-C in collaboration with Dr. Clayborn Bigness as a part of collaborative care agreement.   Total time spent:30 Minutes Time spent includes review of chart, medications, test results, and follow up plan with the patient.      Dr Lavera Guise Internal medicine

## 2022-01-26 ENCOUNTER — Ambulatory Visit (INDEPENDENT_AMBULATORY_CARE_PROVIDER_SITE_OTHER): Payer: Medicare HMO

## 2022-01-26 DIAGNOSIS — M7989 Other specified soft tissue disorders: Secondary | ICD-10-CM | POA: Diagnosis not present

## 2022-01-26 DIAGNOSIS — M79604 Pain in right leg: Secondary | ICD-10-CM | POA: Diagnosis not present

## 2022-01-28 ENCOUNTER — Telehealth: Payer: Self-pay

## 2022-01-28 NOTE — Telephone Encounter (Signed)
-----   Message from Mylinda Latina, PA-C sent at 01/28/2022 12:56 PM EDT ----- Please let him know that his [place] did not show any blood clot, it shows an area of fluid collection from injury to the area but no blood flow in it and will monitor. 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 I would recommend referral to vascular to address this and can put in referral if he is ok with it

## 2022-01-28 NOTE — Telephone Encounter (Signed)
Spoke with patient regarding recent ultrasound and offered patient a referral to vein and vascular, per Ander Purpura, but patient denied needing a referral.

## 2022-02-18 ENCOUNTER — Ambulatory Visit: Payer: Medicare HMO | Admitting: Physician Assistant

## 2022-04-09 ENCOUNTER — Ambulatory Visit: Payer: Medicare HMO | Admitting: Physician Assistant

## 2022-04-22 ENCOUNTER — Encounter: Payer: Self-pay | Admitting: Physician Assistant

## 2022-04-22 ENCOUNTER — Ambulatory Visit (INDEPENDENT_AMBULATORY_CARE_PROVIDER_SITE_OTHER): Payer: Medicare HMO | Admitting: Physician Assistant

## 2022-04-22 VITALS — BP 198/78 | HR 73 | Temp 98.3°F | Resp 16 | Ht 67.0 in | Wt 157.0 lb

## 2022-04-22 DIAGNOSIS — R2242 Localized swelling, mass and lump, left lower limb: Secondary | ICD-10-CM

## 2022-04-22 DIAGNOSIS — E1159 Type 2 diabetes mellitus with other circulatory complications: Secondary | ICD-10-CM | POA: Diagnosis not present

## 2022-04-22 DIAGNOSIS — I739 Peripheral vascular disease, unspecified: Secondary | ICD-10-CM

## 2022-04-22 DIAGNOSIS — I10 Essential (primary) hypertension: Secondary | ICD-10-CM

## 2022-04-22 DIAGNOSIS — Z0001 Encounter for general adult medical examination with abnormal findings: Secondary | ICD-10-CM

## 2022-04-22 NOTE — Progress Notes (Signed)
[facility] [place], [place] [address]  Internal MEDICINE  Office Visit Note  Patient Name: Donald Scott  [id number]  [id number]  Date of Service: 04/22/2022  Chief Complaint  Patient presents with   Medicare Wellness   Diabetes   Hypertension     HPI Pt is here for routine health maintenance examination -BP is again very elevated despite patient stating he is taking his meds, slight improvement on recheck. He states his BP rises due to being in office and is normally ok at home for the most part except today -At home BP 200/74 this morning, but previously was better at home and avg 145/70 at highest.  -Only takes hydralazine twice per day, states if he takes three time per day makes him not feel well. -He mentions that there is an area on his left leg on inner thigh has large bump that has been getting bigger over last few weeks when he first noticed it. Not painful. It is in the area where his previous stent was placed many years ago.  -He did have US done on his other leg a few months ago after a dolly fell on him and a pocket of fluid arose. This [place] did not show any DVT or blood flow in the area of fluid collection. 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 He also declined vascular referral upon receiving results of this [place] a few months ago.  -Discussed that both legs may be impacted and that new area of concern on left along previous stent is concerning for possible blockage of stent or other vascular complications. He was made aware of potential serious risks of not having this evaluated and treated ASAP. Advised to go to ED now, though patient remarks he can't go anywhere now and would wait until early morning. Did have Jonetta Osgood, FNP also come look at area and she was in agreement that patient should go to ED.  Again discussed my recommendation to go now, especially with additional high BP in office and at home today, and the dangers of waiting and he expressed understanding.   -He will at least recheck BP at home  -He was told he can take second tab of amlodipine for total of '10mg'$  if continued elevation in future once leg evaluated as this could also contribute to BP reading -Will have him bring all meds and BP cuff to next visit to compare readings in office with home monitor -Will think about lung cancer screening -declines shingles vaccines -declines urine ACR  Current Medication: Outpatient Encounter Medications as of 04/22/2022  Medication Sig   albuterol (VENTOLIN HFA) 108 (90 Base) MCG/ACT inhaler INHALE 1 PUFF INTO THE LUNGS EVERY 6 HOURS AS NEEDED FOR WHEEZING OR SHORTNESS OF BREATH   Alcohol Swabs PADS Use with cleaning area to test blood sugar twice a day   allopurinol (ZYLOPRIM) 100 MG tablet Take 1 tablet (100 mg total) by mouth daily.   amLODipine (NORVASC) 5 MG tablet Take 1 tablet (5 mg total) by mouth daily.   Blood Glucose Calibration (TRUE METRIX LEVEL 1) Low SOLN Use as directed to check blood sugar twice a day TRUE METRIX LEVEL 1 CTRL SOLN   escitalopram (LEXAPRO) 10 MG tablet TAKE 1 TABLET(10 MG) BY MOUTH DAILY   fluticasone (FLONASE) 50 MCG/ACT nasal spray Place  1 spray into both nostrils as needed.   hydrALAZINE (APRESOLINE) 50 MG tablet Take 50 mg by mouth 3 (three) times daily.   losartan-hydrochlorothiazide (HYZAAR) 100-25 MG tablet Take 1 tablet by mouth daily.   meloxicam (MOBIC) 7.5 MG tablet TAKE 1 TABLET(7.5 MG) BY MOUTH DAILY   omeprazole (PRILOSEC) 40 MG capsule Take 1 capsule by mouth daily.   Pancrelipase, Lip-Prot-Amyl, (ZENPEP) 10000-32000 units CPEP Take 70,000 units every meal and take 30,000 units every snack.   rosuvastatin (CRESTOR) 5 MG tablet Take 1 tablet (5 mg total) by mouth daily.   spironolactone (ALDACTONE) 25 MG tablet Take 1 tablet (25 mg total)  by mouth daily.   TRUEplus Lancets 30G MISC Use to test blood sugar twice a day   No facility-administered encounter medications on file as of 04/22/2022.    Surgical History: Past Surgical History:  Procedure Laterality Date   CAROTID STENT  2010   Vascular bypass graft      Medical History: Past Medical History:  Diagnosis Date   Diabetes mellitus ([place]) 05/22/2015   Gout 05/22/2015   Hypertension     Family History: Family History  Problem Relation Age of Onset   Arthritis Mother       Review of Systems  Constitutional:  Negative for chills, fatigue and unexpected weight change.  HENT:  Negative for congestion, postnasal drip, rhinorrhea, sneezing and sore throat.   Eyes:  Negative for redness.  Respiratory:  Negative for cough, chest tightness and shortness of breath.   Cardiovascular:  Positive for leg swelling. Negative for chest pain and palpitations.  Gastrointestinal:  Negative for abdominal pain, constipation, diarrhea, nausea and vomiting.  Genitourinary:  Negative for dysuria and frequency.  Musculoskeletal:  Negative for arthralgias, back pain, joint swelling and neck pain.  Skin:  Negative for rash.  Neurological: Negative.  Negative for tremors and numbness.  Hematological:  Negative for adenopathy. Does not bruise/bleed easily.  Psychiatric/Behavioral:  Negative for behavioral problems (Depression), sleep disturbance and suicidal ideas. The patient is nervous/anxious.      Vital Signs: BP (!) 198/78 Comment: 212/99  Pulse 73   Temp 98.3 F (36.8 C)   Resp 16   Ht '5\' 7"'$  (1.702 m)   Wt 157 lb (71.2 kg)   SpO2 99%   BMI 24.59 kg/m    Physical Exam Vitals and nursing note reviewed.  Constitutional:      General: He is not in acute distress.    Appearance: He is well-developed and normal weight. He is not diaphoretic.  HENT:     Head: Normocephalic and atraumatic.     Mouth/Throat:     Pharynx: No oropharyngeal exudate.  Eyes:     Pupils:  Pupils are equal, round, and reactive to light.  Neck:     Thyroid: No thyromegaly.     Vascular: No JVD.     Trachea: No tracheal deviation.  Cardiovascular:     Rate and Rhythm: Normal rate and regular rhythm.     Heart sounds: Normal heart sounds. No murmur heard.    No friction rub. No gallop.  Pulmonary:     Effort: Pulmonary effort is normal. No respiratory distress.     Breath sounds: No wheezing or rales.  Chest:     Chest wall: No tenderness.  Abdominal:     General: Bowel sounds are normal.     Palpations: Abdomen is soft.  Musculoskeletal:        General: Swelling present. Normal range of  motion.     Cervical back: Normal range of motion and neck supple.     Comments: Large area of swelling/mass along left upper inner thigh just distal to previous surgical scar from stent. Non tender to palpation, no changes in coloration  Lymphadenopathy:     Cervical: No cervical adenopathy.  Skin:    General: Skin is warm and dry.  Neurological:     Mental Status: He is alert and oriented to person, place, and time.     Cranial Nerves: No cranial nerve deficit.  Psychiatric:        Behavior: Behavior normal.        Thought Content: Thought content normal.        Judgment: Judgment normal.      LABS: Recent Results (from the past 2160 hour(s))  POCT HgB A1C     Status: Abnormal   Collection Time: 01/25/22 10:37 AM  Result Value Ref Range   Hemoglobin A1C 6.3 (A) 4.0 - 5.6 %   HbA1c POC (<> result, manual entry)     HbA1c, POC (prediabetic range)     HbA1c, POC (controlled diabetic range)          Assessment/Plan: 1. Encounter for general adult medical examination with abnormal findings CPE performed, pt declines lung cancer screening or shingles vaccine at this time. Has not had previous labs done  2. Type 2 diabetes mellitus with other circulatory complication, without long-term current use of insulin (HCC) Declines urine ACR today  3. Uncontrolled  hypertension Very elevated in office, pt asymptomatic and states normally ok at home until today. Advised to go to ED due to area of swelling along area of previous stenting in LE in addition to high BP despite taking his meds. Bring all meds and home BP cuff next visit to check in office.  4. Localized swelling, mass, or lump of left lower extremity Advised to go to ED for further evaluation and treatment and discussed potential risks of not doing so.  5. Peripheral vascular disease, unspecified ([place]) Pt has declined vascular referral in past but may reconsider pending ED evaluation of lower extremity swelling on left and hx of abnormal right LE [place]   General Counseling: Sinjin verbalizes understanding of the findings of todays visit and agrees with plan of treatment. I have discussed any further diagnostic evaluation that may be needed or ordered today. We also reviewed his medications today. he has been encouraged to call the office with any questions or concerns that should arise related to todays visit.    Counseling:    No orders of the defined types were placed in this encounter.   No orders of the defined types were placed in this encounter.   This patient was seen by Drema Dallas, PA-C in collaboration with Dr. Clayborn Bigness as a part of collaborative care agreement.  Total time spent:35 Minutes  Time spent includes review of chart, medications, test results, and follow up plan with the patient.     Lavera Guise, MD  Internal Medicine

## 2022-05-04 ENCOUNTER — Inpatient Hospital Stay
Admission: EM | Admit: 2022-05-04 | Discharge: 2022-05-05 | DRG: 556 | Discharge status: Against Medical Advice | Payer: Medicare HMO | Attending: Internal Medicine | Admitting: Internal Medicine

## 2022-05-04 ENCOUNTER — Emergency Department: Payer: Medicare HMO

## 2022-05-04 ENCOUNTER — Other Ambulatory Visit: Payer: Self-pay

## 2022-05-04 ENCOUNTER — Inpatient Hospital Stay: Payer: Medicare HMO

## 2022-05-04 DIAGNOSIS — S8012XA Contusion of left lower leg, initial encounter: Secondary | ICD-10-CM | POA: Diagnosis not present

## 2022-05-04 DIAGNOSIS — Z880 Allergy status to penicillin: Secondary | ICD-10-CM | POA: Diagnosis not present

## 2022-05-04 DIAGNOSIS — E1159 Type 2 diabetes mellitus with other circulatory complications: Secondary | ICD-10-CM

## 2022-05-04 DIAGNOSIS — E876 Hypokalemia: Secondary | ICD-10-CM | POA: Diagnosis present

## 2022-05-04 DIAGNOSIS — Z5329 Procedure and treatment not carried out because of patient's decision for other reasons: Secondary | ICD-10-CM | POA: Diagnosis present

## 2022-05-04 DIAGNOSIS — I729 Aneurysm of unspecified site: Secondary | ICD-10-CM | POA: Diagnosis not present

## 2022-05-04 DIAGNOSIS — R079 Chest pain, unspecified: Secondary | ICD-10-CM | POA: Diagnosis not present

## 2022-05-04 DIAGNOSIS — E1151 Type 2 diabetes mellitus with diabetic peripheral angiopathy without gangrene: Secondary | ICD-10-CM | POA: Diagnosis present

## 2022-05-04 DIAGNOSIS — I739 Peripheral vascular disease, unspecified: Secondary | ICD-10-CM | POA: Diagnosis present

## 2022-05-04 DIAGNOSIS — I70201 Unspecified atherosclerosis of native arteries of extremities, right leg: Secondary | ICD-10-CM | POA: Diagnosis not present

## 2022-05-04 DIAGNOSIS — F1010 Alcohol abuse, uncomplicated: Secondary | ICD-10-CM | POA: Diagnosis present

## 2022-05-04 DIAGNOSIS — R0789 Other chest pain: Secondary | ICD-10-CM | POA: Diagnosis present

## 2022-05-04 DIAGNOSIS — K8689 Other specified diseases of pancreas: Secondary | ICD-10-CM | POA: Diagnosis not present

## 2022-05-04 DIAGNOSIS — E1121 Type 2 diabetes mellitus with diabetic nephropathy: Secondary | ICD-10-CM | POA: Insufficient documentation

## 2022-05-04 DIAGNOSIS — Z91041 Radiographic dye allergy status: Secondary | ICD-10-CM

## 2022-05-04 DIAGNOSIS — Z91148 Patient's other noncompliance with medication regimen for other reason: Secondary | ICD-10-CM

## 2022-05-04 DIAGNOSIS — Z8261 Family history of arthritis: Secondary | ICD-10-CM | POA: Diagnosis not present

## 2022-05-04 DIAGNOSIS — T148XXA Other injury of unspecified body region, initial encounter: Secondary | ICD-10-CM

## 2022-05-04 DIAGNOSIS — I10 Essential (primary) hypertension: Secondary | ICD-10-CM | POA: Diagnosis present

## 2022-05-04 DIAGNOSIS — M7981 Nontraumatic hematoma of soft tissue: Secondary | ICD-10-CM | POA: Diagnosis not present

## 2022-05-04 DIAGNOSIS — F17210 Nicotine dependence, cigarettes, uncomplicated: Secondary | ICD-10-CM | POA: Diagnosis present

## 2022-05-04 DIAGNOSIS — S301XXA Contusion of abdominal wall, initial encounter: Secondary | ICD-10-CM | POA: Diagnosis not present

## 2022-05-04 DIAGNOSIS — L7634 Postprocedural seroma of skin and subcutaneous tissue following other procedure: Secondary | ICD-10-CM | POA: Diagnosis present

## 2022-05-04 DIAGNOSIS — E119 Type 2 diabetes mellitus without complications: Secondary | ICD-10-CM

## 2022-05-04 DIAGNOSIS — Z79899 Other long term (current) drug therapy: Secondary | ICD-10-CM | POA: Diagnosis not present

## 2022-05-04 DIAGNOSIS — K625 Hemorrhage of anus and rectum: Secondary | ICD-10-CM | POA: Diagnosis not present

## 2022-05-04 DIAGNOSIS — K861 Other chronic pancreatitis: Secondary | ICD-10-CM | POA: Diagnosis not present

## 2022-05-04 DIAGNOSIS — Q433 Congenital malformations of intestinal fixation: Secondary | ICD-10-CM | POA: Diagnosis not present

## 2022-05-04 DIAGNOSIS — I701 Atherosclerosis of renal artery: Secondary | ICD-10-CM | POA: Diagnosis not present

## 2022-05-04 DIAGNOSIS — K922 Gastrointestinal hemorrhage, unspecified: Secondary | ICD-10-CM

## 2022-05-04 DIAGNOSIS — K862 Cyst of pancreas: Secondary | ICD-10-CM | POA: Diagnosis not present

## 2022-05-04 DIAGNOSIS — M7989 Other specified soft tissue disorders: Secondary | ICD-10-CM | POA: Diagnosis not present

## 2022-05-04 DIAGNOSIS — K3189 Other diseases of stomach and duodenum: Secondary | ICD-10-CM | POA: Diagnosis not present

## 2022-05-04 DIAGNOSIS — Q272 Other congenital malformations of renal artery: Secondary | ICD-10-CM | POA: Diagnosis not present

## 2022-05-04 LAB — CBC
HCT: 37.6 % — ABNORMAL LOW (ref 39.0–52.0)
HCT: 37.5 % — ABNORMAL LOW (ref 39.0–52.0)
Hemoglobin: 12.1 g/dL — ABNORMAL LOW (ref 13.0–17.0)
Hemoglobin: 12.2 g/dL — ABNORMAL LOW (ref 13.0–17.0)
MCH: 30.3 pg (ref 26.0–34.0)
MCH: 30.2 pg (ref 26.0–34.0)
MCHC: 32.2 g/dL (ref 30.0–36.0)
MCHC: 32.5 g/dL (ref 30.0–36.0)
MCV: 94.2 fL (ref 80.0–100.0)
MCV: 92.8 fL (ref 80.0–100.0)
Platelets: 123 10*3/uL — ABNORMAL LOW (ref 150–400)
Platelets: 122 10*3/uL — ABNORMAL LOW (ref 150–400)
RBC: 3.99 MIL/uL — ABNORMAL LOW (ref 4.22–5.81)
RBC: 4.04 MIL/uL — ABNORMAL LOW (ref 4.22–5.81)
RDW: 12.7 % (ref 11.5–15.5)
RDW: 12.6 % (ref 11.5–15.5)
WBC: 6.9 10*3/uL (ref 4.0–10.5)
WBC: 8 10*3/uL (ref 4.0–10.5)
nRBC: 0 % (ref 0.0–0.2)
nRBC: 0 % (ref 0.0–0.2)

## 2022-05-04 LAB — COMPREHENSIVE METABOLIC PANEL
ALT: 30 U/L (ref 0–44)
AST: 30 U/L (ref 15–41)
Albumin: 3.9 g/dL (ref 3.5–5.0)
Alkaline Phosphatase: 77 U/L (ref 38–126)
Anion gap: 3 — ABNORMAL LOW (ref 5–15)
BUN: 13 mg/dL (ref 8–23)
CO2: 24 mmol/L (ref 22–32)
Calcium: 7.6 mg/dL — ABNORMAL LOW (ref 8.9–10.3)
Chloride: 107 mmol/L (ref 98–111)
Creatinine, Ser: 1.05 mg/dL (ref 0.61–1.24)
GFR, Estimated: >60 mL/min (ref >60)
Glucose, Bld: 198 mg/dL — ABNORMAL HIGH (ref 70–99)
Potassium: 2.8 mmol/L — ABNORMAL LOW (ref 3.5–5.1)
Sodium: 134 mmol/L — ABNORMAL LOW (ref 135–145)
Total Bilirubin: 0.9 mg/dL (ref 0.3–1.2)
Total Protein: 7.1 g/dL (ref 6.5–8.1)

## 2022-05-04 LAB — TROPONIN I (HIGH SENSITIVITY)
Troponin I (High Sensitivity): 7 ng/L (ref <18)
Troponin I (High Sensitivity): 6 ng/L (ref <18)

## 2022-05-04 LAB — PROTIME-INR
INR: 1.1 (ref 0.8–1.2)
Prothrombin Time: 14 seconds (ref 11.4–15.2)

## 2022-05-04 LAB — TYPE AND SCREEN
ABO/RH(D): O NEG
Antibody Screen: NEGATIVE

## 2022-05-04 LAB — BASIC METABOLIC PANEL
Anion gap: 8 (ref 5–15)
BUN: 13 mg/dL (ref 8–23)
CO2: 20 mmol/L — ABNORMAL LOW (ref 22–32)
Calcium: 8.2 mg/dL — ABNORMAL LOW (ref 8.9–10.3)
Chloride: 104 mmol/L (ref 98–111)
Creatinine, Ser: 0.87 mg/dL (ref 0.61–1.24)
GFR, Estimated: >60 mL/min (ref >60)
Glucose, Bld: 219 mg/dL — ABNORMAL HIGH (ref 70–99)
Potassium: 3.4 mmol/L — ABNORMAL LOW (ref 3.5–5.1)
Sodium: 132 mmol/L — ABNORMAL LOW (ref 135–145)

## 2022-05-04 LAB — MAGNESIUM: Magnesium: 2.1 mg/dL (ref 1.7–2.4)

## 2022-05-04 LAB — CBG MONITORING, ED: Glucose-Capillary: 127 mg/dL — ABNORMAL HIGH (ref 70–99)

## 2022-05-04 MED ORDER — ROSUVASTATIN CALCIUM 5 MG PO TABS
5.0000 mg | ORAL_TABLET | Freq: Every day | ORAL | Status: DC
  Administered 2022-05-05: 5 mg via ORAL
  Filled 2022-05-04: qty 1

## 2022-05-04 MED ORDER — LOSARTAN POTASSIUM 50 MG PO TABS
100.0000 mg | ORAL_TABLET | Freq: Every day | ORAL | Status: DC
  Administered 2022-05-04 – 2022-05-05 (×2): 100 mg via ORAL
  Filled 2022-05-04 (×2): qty 2

## 2022-05-04 MED ORDER — THIAMINE MONONITRATE 100 MG PO TABS
100.0000 mg | ORAL_TABLET | Freq: Every day | ORAL | Status: DC
  Administered 2022-05-04 – 2022-05-05 (×2): 100 mg via ORAL
  Filled 2022-05-04 (×2): qty 1

## 2022-05-04 MED ORDER — LOSARTAN POTASSIUM-HCTZ 100-25 MG PO TABS: 1.0000 | ORAL_TABLET | Freq: Every day | ORAL | Status: DC

## 2022-05-04 MED ORDER — SODIUM CHLORIDE 0.9 % IV SOLN: Freq: Once | INTRAVENOUS | Status: AC

## 2022-05-04 MED ORDER — DIPHENHYDRAMINE HCL 25 MG PO CAPS
50.0000 mg | ORAL_CAPSULE | Freq: Once | ORAL | Status: AC
  Administered 2022-05-05: 50 mg via ORAL
  Filled 2022-05-04: qty 2

## 2022-05-04 MED ORDER — DIPHENHYDRAMINE HCL 50 MG/ML IJ SOLN: 50.0000 mg | Freq: Once | INTRAMUSCULAR | Status: AC

## 2022-05-04 MED ORDER — ADULT MULTIVITAMIN W/MINERALS CH
1.0000 | ORAL_TABLET | Freq: Every day | ORAL | Status: DC
  Administered 2022-05-04 – 2022-05-05 (×2): 1 via ORAL
  Filled 2022-05-04 (×2): qty 1

## 2022-05-04 MED ORDER — SPIRONOLACTONE 25 MG PO TABS
25.0000 mg | ORAL_TABLET | Freq: Every day | ORAL | Status: DC
  Administered 2022-05-04 – 2022-05-05 (×2): 25 mg via ORAL
  Filled 2022-05-04 (×2): qty 1

## 2022-05-04 MED ORDER — HYDRALAZINE HCL 50 MG PO TABS
50.0000 mg | ORAL_TABLET | Freq: Three times a day (TID) | ORAL | Status: DC
  Administered 2022-05-04 (×2): 50 mg via ORAL
  Filled 2022-05-04 (×2): qty 1

## 2022-05-04 MED ORDER — ACETAMINOPHEN 650 MG RE SUPP: 650.0000 mg | Freq: Four times a day (QID) | RECTAL | Status: DC | PRN

## 2022-05-04 MED ORDER — POTASSIUM CHLORIDE CRYS ER 20 MEQ PO TBCR
40.0000 meq | EXTENDED_RELEASE_TABLET | Freq: Once | ORAL | Status: AC
  Administered 2022-05-04: 40 meq via ORAL
  Filled 2022-05-04: qty 2

## 2022-05-04 MED ORDER — POTASSIUM CHLORIDE 10 MEQ/100ML IV SOLN
10.0000 meq | INTRAVENOUS | Status: AC
  Administered 2022-05-04 (×2): 10 meq via INTRAVENOUS
  Filled 2022-05-04 (×2): qty 100

## 2022-05-04 MED ORDER — HYDRALAZINE HCL 50 MG PO TABS: 50.0000 mg | ORAL_TABLET | Freq: Once | ORAL | Status: DC

## 2022-05-04 MED ORDER — HYDRALAZINE HCL 50 MG PO TABS
100.0000 mg | ORAL_TABLET | Freq: Three times a day (TID) | ORAL | Status: DC
  Administered 2022-05-05: 100 mg via ORAL
  Filled 2022-05-04: qty 2

## 2022-05-04 MED ORDER — AMLODIPINE BESYLATE 5 MG PO TABS
5.0000 mg | ORAL_TABLET | Freq: Every day | ORAL | Status: DC
  Administered 2022-05-04 – 2022-05-05 (×2): 5 mg via ORAL
  Filled 2022-05-04 (×2): qty 1

## 2022-05-04 MED ORDER — ESCITALOPRAM OXALATE 10 MG PO TABS
10.0000 mg | ORAL_TABLET | Freq: Every day | ORAL | Status: DC
  Administered 2022-05-05: 10 mg via ORAL
  Filled 2022-05-04: qty 1

## 2022-05-04 MED ORDER — ACETAMINOPHEN 325 MG PO TABS: 650.0000 mg | ORAL_TABLET | Freq: Four times a day (QID) | ORAL | Status: DC | PRN

## 2022-05-04 MED ORDER — HYDROCHLOROTHIAZIDE 25 MG PO TABS
25.0000 mg | ORAL_TABLET | Freq: Every day | ORAL | Status: DC
  Administered 2022-05-04 – 2022-05-05 (×2): 25 mg via ORAL
  Filled 2022-05-04 (×2): qty 1

## 2022-05-04 MED ORDER — ALLOPURINOL 100 MG PO TABS
100.0000 mg | ORAL_TABLET | Freq: Every day | ORAL | Status: DC
  Administered 2022-05-05: 100 mg via ORAL
  Filled 2022-05-04: qty 1

## 2022-05-04 MED ORDER — HYDRALAZINE HCL 50 MG PO TABS
50.0000 mg | ORAL_TABLET | Freq: Once | ORAL | Status: AC
  Administered 2022-05-04: 50 mg via ORAL
  Filled 2022-05-04: qty 1

## 2022-05-04 MED ORDER — PREDNISONE 50 MG PO TABS
50.0000 mg | ORAL_TABLET | Freq: Four times a day (QID) | ORAL | Status: AC
  Administered 2022-05-04 – 2022-05-05 (×3): 50 mg via ORAL
  Filled 2022-05-04 (×3): qty 1

## 2022-05-04 MED ORDER — ONDANSETRON HCL 4 MG PO TABS: 4.0000 mg | ORAL_TABLET | Freq: Four times a day (QID) | ORAL | Status: DC | PRN

## 2022-05-04 MED ORDER — ONDANSETRON HCL 4 MG/2ML IJ SOLN: 4.0000 mg | Freq: Four times a day (QID) | INTRAMUSCULAR | Status: DC | PRN

## 2022-05-04 MED ORDER — INSULIN ASPART 100 UNIT/ML IJ SOLN
0.0000 [IU] | Freq: Three times a day (TID) | INTRAMUSCULAR | Status: DC
  Filled 2022-05-04: qty 1

## 2022-05-04 MED ORDER — FOLIC ACID 1 MG PO TABS
1.0000 mg | ORAL_TABLET | Freq: Every day | ORAL | Status: DC
  Administered 2022-05-04 – 2022-05-05 (×2): 1 mg via ORAL
  Filled 2022-05-04 (×2): qty 1

## 2022-05-04 MED ORDER — SODIUM CHLORIDE 0.9% FLUSH
3.0000 mL | Freq: Two times a day (BID) | INTRAVENOUS | Status: DC
  Administered 2022-05-04 – 2022-05-05 (×3): 3 mL via INTRAVENOUS

## 2022-05-04 NOTE — Assessment & Plan Note (Signed)
The patient states his blood pressure has been up for 35 years and he is not concerned about his high blood pressure.  I did increase his Norvasc to 10 mg daily and hydralazine to 100 mg 3 times a day even though he signed out [place].

## 2022-05-04 NOTE — Assessment & Plan Note (Signed)
No signs of withdrawal while here.

## 2022-05-04 NOTE — Consult Note (Signed)
Hospital Consult    Reason for Consult:  Left Leg Lump  Requesting Physician:  Dr Revonda Humphrey MD. MRN #:  [id number]  History of Present Illness: This is a 72 y.o. 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   On exam today he has a 5 cm round lump on the left anterior side of his thigh.  It starts at the end of a prior bypass incision.  There are strong pulses proximally before the lump but no pulses after.  It is hard but mobile.  It is painful to palpation.  Patient states that when he has had a bowel movement with bright red blood in it the lump will decrease for short period of time but it returns.  Currently his blood pressure is 237/105 and prior to that blood pressure was 233/94 with a heart rate in the 60s.  He states he takes his blood pressure regularly but despite all this his blood pressure over the last 30 years has been very difficult to control.  He admits to currently smoking 2 packs of cigarettes a day and drinking 6-8 beers with some alcohol daily.  He denies any dizziness, shortness of breath at this time.  He does endorse chest pain but its only been since the last 3 days and very mild in nature.  Past Medical History:  Diagnosis Date   Diabetes mellitus ([place]) 05/22/2015   Gout 05/22/2015   Hypertension     Past Surgical History:  Procedure Laterality Date   CAROTID STENT  2010   Vascular bypass graft      Allergies  Allergen Reactions   Iodinated Contrast Media Other (See Comments)    Other Reaction: SOB, hives, flushing   Other Hives, Other (See Comments) and Rash    Uncoded Allergy. Allergen: CT Dye Uncoded Allergy. Allergen: ISOVUE 300, Other Reaction: Itching (even w/prep) Other reaction(s): Other (See Comments) Uncoded Allergy. Allergen: ISOVUE 300, Other Reaction: Itching (even w/prep) Uncoded  Allergy. Allergen: CT Dye    Penicillin G Other (See Comments)    Prior to Admission medications   Medication Sig Start Date End Date Taking? Authorizing Provider  albuterol (VENTOLIN HFA) 108 (90 Base) MCG/ACT inhaler INHALE 1 PUFF INTO THE LUNGS EVERY 6 HOURS AS NEEDED FOR WHEEZING OR SHORTNESS OF BREATH Patient taking differently: Inhale 1 puff into the lungs every 6 (six) hours as needed for wheezing or shortness of breath. 04/21/21  Yes McDonough, Lauren K, PA-C  allopurinol (ZYLOPRIM) 100 MG tablet Take 1 tablet (100 mg total) by mouth daily. 12/15/20  Yes McDonough, Lauren K, PA-C  amLODipine (NORVASC) 5 MG tablet Take 1 tablet (5 mg total) by mouth daily. 01/25/22  Yes McDonough, Lauren K, PA-C  escitalopram (LEXAPRO) 10 MG tablet TAKE 1 TABLET(10 MG) BY MOUTH DAILY Patient taking differently: Take 10 mg by mouth daily. TAKE 1 TABLET(10 MG) BY MOUTH DAILY 01/25/22  Yes McDonough, Lauren K, PA-C  fluticasone (FLONASE) 50 MCG/ACT nasal spray Place 1 spray into both nostrils as needed. 04/06/16  Yes [provider]  hydrALAZINE (APRESOLINE) 50 MG tablet Take 50 mg by mouth 3 (three) times daily.   Yes [provider]  losartan-hydrochlorothiazide (HYZAAR) 100-25 MG tablet Take 1 tablet by mouth daily. 01/25/22  Yes McDonough, Lauren K, PA-C  meloxicam (MOBIC) 7.5 MG tablet TAKE 1 TABLET(7.5 MG) BY MOUTH  DAILY 10/13/21  Yes McDonough, Lauren K, PA-C  omeprazole (PRILOSEC) 40 MG capsule Take 40 mg by mouth daily.   Yes [provider]  rosuvastatin (CRESTOR) 5 MG tablet Take 1 tablet (5 mg total) by mouth daily. 01/25/22  Yes McDonough, Si Gaul, PA-C  spironolactone (ALDACTONE) 25 MG tablet Take 1 tablet (25 mg total) by mouth daily. 08/18/21  Yes McDonough, Si Gaul, PA-C  Alcohol Swabs PADS Use with cleaning area to test blood sugar twice a day 11/27/19   Ronnell Freshwater, NP  Blood Glucose Calibration (TRUE METRIX LEVEL 1) Low SOLN Use as directed to check blood sugar  twice a day TRUE METRIX LEVEL 1 CTRL SOLN 11/27/19   Boscia, Heather E, NP  Pancrelipase, Lip-Prot-Amyl, (ZENPEP) 10000-32000 units CPEP Take 70,000 units every meal and take 30,000 units every snack. Patient not taking: Reported on 05/04/2022 09/29/21   Jonathon Bellows, MD  TRUEplus Lancets 30G MISC Use to test blood sugar twice a day 11/27/19   Ronnell Freshwater, NP    Social History   Socioeconomic History   Marital status: Married    Spouse name: Not on file   Number of children: Not on file   Years of education: Not on file   Highest education level: Not on file  Occupational History   Not on file  Tobacco Use   Smoking status: Every Day    Packs/day: 1.00    Years: 55.00    Total pack years: 55.00    Types: Cigarettes   Smokeless tobacco: Never   Tobacco comments:    1 pack daily  Substance and Sexual Activity   Alcohol use: Yes    Alcohol/week: 18.0 standard drinks of alcohol    Types: 18 Cans of beer per week   Drug use: No   Sexual activity: Not on file  Other Topics Concern   Not on file  Social History Narrative   Not on file   Social Determinants of Health   Financial Resource Strain: Not on file  Food Insecurity: Not on file  Transportation Needs: Not on file  Physical Activity: Not on file  Stress: Not on file  Social Connections: Not on file  Intimate Partner Violence: Not on file     Family History  Problem Relation Age of Onset   Arthritis Mother     ROS: Otherwise negative unless mentioned in HPI  Physical Examination  Vitals:   05/04/22 1416 05/04/22 1445  BP:  (!) 224/94  Pulse:  (!) 58  Resp:  17  Temp: 97.9 F (36.6 C)   SpO2:  98%   Body mass index is 24.59 kg/m.  General:  WDWN in NAD Gait: Not observed HENT: WNL, normocephalic Pulmonary: normal non-labored breathing, without Rales, rhonchi,  wheezing Cardiac: regular, without  Murmurs, rubs or gallops; without carotid bruits Abdomen: Positive bowel sounds, soft, NT/ND, no  masses Skin: without rashes Vascular Exam/Pulses: Strong pulses throughout on palpation.  Extremities: without ischemic changes, without Gangrene , without cellulitis; without open wounds; Noted 5 CM round lump to left inner thigh starting just after a prior incision for bypass grafting.  Musculoskeletal: no muscle wasting or atrophy  Neurologic: A&O X 3;  No focal weakness or paresthesias are detected; speech is fluent/normal Psychiatric:  The pt has Normal affect. Lymph:  Unremarkable  CBC    Component Value Date/Time   WBC 6.9 05/04/2022 0907   RBC 3.99 (L) 05/04/2022 0907   HGB 12.1 (L) 05/04/2022 4259  HGB 13.8 12/17/2019 0910   HCT 37.6 (L) 05/04/2022 0907   HCT 43.7 12/17/2019 0910   PLT 123 (L) 05/04/2022 0907   PLT 159 12/17/2019 0910   MCV 94.2 05/04/2022 0907   MCV 94 12/17/2019 0910   MCV 94 04/30/2014 1142   MCH 30.3 05/04/2022 0907   MCHC 32.2 05/04/2022 0907   RDW 12.7 05/04/2022 0907   RDW 11.4 (L) 12/17/2019 0910   RDW 13.1 04/30/2014 1142   LYMPHSABS 2.0 11/12/2021 1509   LYMPHSABS 1.8 04/30/2014 1142   MONOABS 0.7 11/12/2021 1509   MONOABS 0.6 04/30/2014 1142   EOSABS 0.1 11/12/2021 1509   EOSABS 0.1 04/30/2014 1142   BASOSABS 0.0 11/12/2021 1509   BASOSABS 0.0 04/30/2014 1142    BMET    Component Value Date/Time   NA 134 (L) 05/04/2022 0907   NA 136 12/17/2019 0910   K 2.8 (L) 05/04/2022 0907   CL 107 05/04/2022 0907   CO2 24 05/04/2022 0907   GLUCOSE 198 (H) 05/04/2022 0907   BUN 13 05/04/2022 0907   BUN 16 12/17/2019 0910   CREATININE 1.05 05/04/2022 0907   CALCIUM 7.6 (L) 05/04/2022 0907   GFRNONAA >60 05/04/2022 0907   GFRAA 78 12/17/2019 0910    COAGS: Lab Results  Component Value Date   INR 1.1 05/04/2022   INR 1.0 04/30/2014     Non-Invasive Vascular Imaging:   EXAM: LEFT LOWER EXTREMITY ARTERIAL DUPLEX SCAN  FINDINGS: Patent bypass graft from the common femoral artery to the distal SFA. There is a complex fluid  collection measuring up to 5 cm in transverse diameter, greater than 7 cm in length adjacent to the graft in the thigh. Color Doppler shows arterial phase flow external to the graft in the collection suggesting possible leak/pseudoaneurysm.  IMPRESSION: Large hematoma or pseudoaneurysm adjacent to fem-distal graft.   Statin:  Yes.   Beta Blocker:  No. Aspirin:  No. ACEI:  No. ARB:  No. CCB use:  Yes Other antiplatelets/anticoagulants:  No.    ASSESSMENT/PLAN: This is a 72 y.o. male with PAD s/p fem-pop bypass from about 10 years ago, presents to the ED with complaints of left leg lump and rectal bleeding. On Ultrasound the impression shows a large hematoma or pseudoaneurysm directly adjacent to the Fem-distal graft. This could be a leaking aneurysm or possibly an infected graft.   PLAN: Vascular surgery recommends getting a CT Scan of the abdomen/Pelvis with Left Leg prior to any intervention for better evaluation. Patient has a noted severe allergy to contrast dye and states he gets Hives and Shortness of Breath. He will need to be premedicated for IV Dye Allergy via protocol for 18 hours prior. Hopefully we can get this completed sometime tonight.   He also has severe untreated Hypertension for the past 30 years which needs to be in better control prior to any possible interventional procedure or surgery.   No Intervention or surgery is scheduled at this time. We will wait for the finding of the CT Scan prior to a decision as to how to proceed. We will continue to follow.   Findings and Plan were discussed with Dr Ella Jubilee and he is in agreement.    Drema Pry [facility] 05/04/2022 4:03 PM

## 2022-05-04 NOTE — ED Notes (Signed)
Pt to CT

## 2022-05-04 NOTE — Progress Notes (Signed)
Please advise patient that this is a shared room

## 2022-05-04 NOTE — H&P (Addendum)
History and Physical    Patient: Donald Scott IWP:[id number] DOB: 1950-08-21 DOA: 05/04/2022 DOS: the patient was seen and examined on 05/04/2022 PCP: Mylinda Latina, PA-C  Patient coming from: Home  Chief Complaint:  Chief Complaint  Patient presents with   Wound Check   HPI: [name] is a 72 y.o. 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   [name] states that approximately 2 weeks ago, he noticed a lump developing on his upper thigh of his left leg.  Lump is not painful to touch.  Initially, it did get bigger but has since been stable.  Occasionally when he has a bowel movement though, it goes down temporarily and then goes back to its original size.  He has not noticed any skin changes or drainage.  He notes that he had a femoral-popliteal bypass on the same leg approximately 10 years ago.  He denies any fever, chills.  [name] also states that approximately 3 days ago, he started to have bright red blood in his stool that is both on the toilet paper and mixed with the stool.  He denies any pain with bowel movements or abdominal pain.  He wonders if it is connected to the lump that is on his leg and is concerned that the blood is going from the lump to his colon.  He denies any history of diverticulosis.  He denies any dizziness, shortness of breath.    He endorses chest pain but states it has been present for the last 3 days and mild in nature.  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  [name] also states that he takes his blood pressure medicines as instructed and despite this, his blood pressure has been very difficult to control for the past 30 years.  ED Course:  On arrival to the ED, patient was initially normotensive at 126/72 blood pressure subsequently increased  to 209/89.  Heart rate has ranged between 56-72.  He was afebrile at 98.6.  Saturating at 100% on room air.  Initial workup remarkable for potassium of 2.8, magnesium 2.1, hemoglobin of 12.1, platelets of 123.  INR within normal limits at 1.1.  Glucose elevated at 198.  Chest x-ray was obtained that demonstrated no acute cardiopulmonary abnormalities.  Lower extremity arterial duplex was obtained that demonstrated large hematoma versus pseudoaneurysm adjacent to femme distal graft measuring up to 5 cm x 7 cm.  Due to this, vascular surgery was consulted.  TRH contacted for admission.  Review of Systems: As mentioned in the history of present illness. All other systems reviewed and are negative.  Past Medical History:  Diagnosis Date   Diabetes mellitus ([place]) 05/22/2015   Gout 05/22/2015   Hypertension    Past Surgical History:  Procedure Laterality Date   CAROTID STENT  2010   Vascular bypass graft     Social History:  reports that he has been smoking cigarettes. He has a 55.00 pack-year smoking history. He has never used smokeless tobacco. He reports current alcohol use of about 18.0 standard drinks of alcohol per week. He reports that he does not use drugs.  Allergies  Allergen Reactions   Iodinated Contrast Media Other (See Comments)    Other Reaction: SOB, hives, flushing   Other Hives, Other (See Comments)  and Rash    Uncoded Allergy. Allergen: CT Dye Uncoded Allergy. Allergen: ISOVUE 300, Other Reaction: Itching (even w/prep) Other reaction(s): Other (See Comments) Uncoded Allergy. Allergen: ISOVUE 300, Other Reaction: Itching (even w/prep) Uncoded Allergy. Allergen: CT Dye    Penicillin G Other (See Comments)    Family History  Problem Relation Age of Onset   Arthritis Mother     Prior to Admission medications   Medication Sig Start Date End Date Taking? Authorizing Provider  amLODipine (NORVASC) 5 MG tablet Take 1 tablet (5 mg total) by mouth daily. 01/25/22  Yes  McDonough, Lauren K, PA-C  escitalopram (LEXAPRO) 10 MG tablet TAKE 1 TABLET(10 MG) BY MOUTH DAILY 01/25/22  Yes McDonough, Lauren K, PA-C  losartan-hydrochlorothiazide (HYZAAR) 100-25 MG tablet Take 1 tablet by mouth daily. 01/25/22  Yes McDonough, Lauren K, PA-C  rosuvastatin (CRESTOR) 5 MG tablet Take 1 tablet (5 mg total) by mouth daily. 01/25/22  Yes McDonough, Si Gaul, PA-C  spironolactone (ALDACTONE) 25 MG tablet Take 1 tablet (25 mg total) by mouth daily. 08/18/21  Yes McDonough, Lauren K, PA-C  albuterol (VENTOLIN HFA) 108 (90 Base) MCG/ACT inhaler INHALE 1 PUFF INTO THE LUNGS EVERY 6 HOURS AS NEEDED FOR WHEEZING OR SHORTNESS OF BREATH 04/21/21   McDonough, Si Gaul, PA-C  Alcohol Swabs PADS Use with cleaning area to test blood sugar twice a day 11/27/19   Ronnell Freshwater, NP  allopurinol (ZYLOPRIM) 100 MG tablet Take 1 tablet (100 mg total) by mouth daily. 12/15/20   McDonough, Si Gaul, PA-C  Blood Glucose Calibration (TRUE METRIX LEVEL 1) Low SOLN Use as directed to check blood sugar twice a day TRUE METRIX LEVEL 1 CTRL SOLN 11/27/19   Boscia, Heather E, NP  fluticasone (FLONASE) 50 MCG/ACT nasal spray Place 1 spray into both nostrils as needed. 04/06/16   [provider]  hydrALAZINE (APRESOLINE) 50 MG tablet Take 50 mg by mouth 3 (three) times daily.    [provider]  meloxicam (MOBIC) 7.5 MG tablet TAKE 1 TABLET(7.5 MG) BY MOUTH DAILY 10/13/21   McDonough, Si Gaul, PA-C  omeprazole (PRILOSEC) 40 MG capsule Take 1 capsule by mouth daily.    [provider]  Pancrelipase, Lip-Prot-Amyl, (ZENPEP) 10000-32000 units CPEP Take 70,000 units every meal and take 30,000 units every snack. 09/29/21   Jonathon Bellows, MD  TRUEplus Lancets 30G MISC Use to test blood sugar twice a day 11/27/19   Ronnell Freshwater, NP    Physical Exam: Vitals:   05/04/22 1230 05/04/22 1300 05/04/22 1416 05/04/22 1445  BP: (!) 210/93 (!) 188/96  (!) 224/94  Pulse: (!) 52 61  (!) 58  Resp: '14  15  17  '$ Temp:   97.9 F (36.6 C)   TempSrc:   Oral   SpO2: 99% 98%  98%  Weight:      Height:       Physical Exam Vitals and nursing note reviewed.  Constitutional:      General: He is not in acute distress.    Appearance: He is normal weight. He is not toxic-appearing.  HENT:     Head: Normocephalic and atraumatic.     Mouth/Throat:     Mouth: Mucous membranes are moist.     Pharynx: Oropharynx is clear.     Comments: Poor dentition Eyes:     Conjunctiva/sclera: Conjunctivae normal.     Pupils: Pupils are equal, round, and reactive to light.  Cardiovascular:     Rate and Rhythm: Normal  rate and regular rhythm.     Heart sounds: No murmur heard.    No gallop.  Pulmonary:     Effort: Pulmonary effort is normal. No respiratory distress.     Breath sounds: Normal breath sounds. No wheezing, rhonchi or rales.  Abdominal:     General: Bowel sounds are normal. There is no distension.     Palpations: Abdomen is soft.     Tenderness: There is no abdominal tenderness. There is no guarding.  Musculoskeletal:     Right lower leg: No edema.     Left lower leg: No edema.  Skin:    General: Skin is warm and dry.     Comments: Overlying the medial aspect of the left thigh, there is a large nontender semimobile mass that is warm to the touch.  Femoral pulses able to be palpated just superior to the mass.  No overlying skin erythema, laceration, abrasion or rash.  Neurological:     General: No focal deficit present.     Mental Status: He is alert and oriented to person, place, and time. Mental status is at baseline.  Psychiatric:        Mood and Affect: Mood normal.        Behavior: Behavior normal.    Data Reviewed: CBC with WBC of 6.9, hemoglobin of 12.1, platelets of 123 CMP with sodium of 134, potassium of 2.8, bicarb 28, glucose 198, creatinine 1.05, anion gap 3, calcium 7.6, AST 30, ALT 30, GFR over 60 Magnesium within normal limits at 2.1 Troponin negative x 2 INR within  normal limits at 1.1  EKG personally reviewed.  Sinus rhythm with rate of 60.  Repolarization changes noted in the lateral leads with findings consistent with LVH.  Compared to EKG obtained in August 2023, no significant changes.  Repolarization changes in lateral leads were less notable, but still present.  CT ABDOMEN PELVIS WO CONTRAST  Result Date: 05/04/2022 CLINICAL DATA:  Diverticulitis. EXAM: CT ABDOMEN AND PELVIS WITHOUT CONTRAST TECHNIQUE: Multidetector CT imaging of the abdomen and pelvis was performed following the standard protocol without IV contrast. RADIATION DOSE REDUCTION: This exam was performed according to the departmental dose-optimization program which includes automated exposure control, adjustment of the mA and/or kV according to patient size and/or use of iterative reconstruction technique. COMPARISON:  CT 2012. Ultrasound kidneys 10/13/2021. MRI 2018 and older FINDINGS: Lower chest: Breathing motion along the lung bases and upper abdomen. There is some dependent atelectasis or scarring at the lung bases. No pleural effusion. Heart is slightly enlarged. Trace pericardial fluid. Coronary artery calcifications are seen. Hepatobiliary: On this non IV contrast exam no clear space-occupying liver lesion. There is a tiny cysts suggested in segment 4 on series 2, image 12 measuring 8 mm. Gallbladder is present and nondilated. Pancreas: Extensive calcifications along the pancreas with atrophy and ductal dilatation. Please correlate for chronic calcific pancreatitis. The duct dilatation is similar to previous. On the MRI there were some areas of potential filling defects in the dilated pancreatic duct with calcification. Spleen: Spleen is nonenlarged. Adrenals/Urinary Tract: Adrenal glands are grossly preserved and unchanged. Minimal thickening of the left adrenal gland is stable. No abnormal calcifications seen within either kidney nor along the course of either ureter. Preserved contours of  the urinary bladder. Stomach/Bowel: The sigmoid colon is redundant course into the central abdomen with a very slight twist along its course. Mesenteric vessels also slightly turn, nonspecific. The large bowel overall is nondilated with scattered colonic stool. Few  scattered diverticula identified diffusely along the colon. Appendix extends medial to the cecum in the right lower quadrant. Appendix is best seen on for example series 5, image 44 coronal datasets. Stomach is underdistended. Previously there was some fold thickening along the stomach which again is suggested. Please correlate with symptoms. Small bowel is nondilated. Vascular/Lymphatic: Extensive vascular calcifications are identified including along several branch vessels suggestive of significant stenosis such as renal arteries, iliac vessels. There are bilateral external iliac artery stents. Reproductive: Prostate is unremarkable. Other: Mild anasarca. If there is further concern, contrast study can be performed to further delineate for the patient's symptoms. Musculoskeletal: Curvature of the spine with scattered degenerative changes. Bridging osteophytes along the SI joints. IMPRESSION: 1. No bowel obstruction, free air or free fluid.  Normal appendix. 2. Few colonic diverticula without inflammatory changes at this time on this noncontrast exam no CT sequelae of diverticulitis. 3. Chronic calcific pancreatitis. 4. Extensive vascular calcifications including along several branch vessels suggestive of significant stenosis such as renal arteries, iliac vessels. 5. Persistent gastric fold thickening as on prior. 6. Redundant course to the nondilated sigmoid colon with a slight twist, nonspecific. Again no dilatation. Electronically Signed   By: Jill Side M.D.   On: 05/04/2022 16:22   [place] Lower Ext Art Left Ltd  Result Date: 05/04/2022 CLINICAL DATA:  Leg swelling, mass EXAM: LEFT LOWER EXTREMITY ARTERIAL DUPLEX SCAN TECHNIQUE: Gray-scale  sonography as well as color Doppler and duplex ultrasound was performed to evaluate the left lower extremity including vascular evaluation COMPARISON:  None Available. FINDINGS: Patent bypass graft from the common femoral artery to the distal SFA. There is a complex fluid collection measuring up to 5 cm in transverse diameter, greater than 7 cm in length adjacent to the graft in the thigh. Color Doppler shows arterial phase flow external to the graft in the collection suggesting possible leak/pseudoaneurysm. IMPRESSION: Large hematoma or pseudoaneurysm adjacent to fem-distal graft. Electronically Signed   By: Lucrezia Europe M.D.   On: 05/04/2022 12:34   DG Chest Portable 1 View  Result Date: 05/04/2022 CLINICAL DATA:  Chest pain EXAM: PORTABLE CHEST 1 VIEW COMPARISON:  11/12/2021 FINDINGS: Heart and mediastinal contours are within normal limits. No focal opacities or effusions. No acute bony abnormality. Aortic atherosclerosis. IMPRESSION: No active disease. Electronically Signed   By: Rolm Baptise M.D.   On: 05/04/2022 10:34    There are no new results to review at this time.  Assessment and Plan:  * Pseudoaneurysm ([place]) Patient is presenting with a large lump overlying prior femoral-popliteal bypass, most consistent with likely pseudoaneurysm differential also includes hematoma.  Due to contrast allergy, will need to do prep with prednisone and Benadryl prior to CTA.  - Vascular surgery following; appreciate their recommendations - CTA ordered, likely tomorrow morning after completing 18-hour prednisone and Benadryl prep - Frequent neurovascular checks due to high risk for rupture in the setting of uncontrolled hypertension  Severe hypertension Patient is presenting with severe asymptomatic hypertension that is chronic in nature.  Current regimen includes amlodipine 5 mg, hydralazine 50 mg 3 times daily, losartan-HCTZ 100-25 mg, and spironolactone 25 mg.  Patient states he has not taken any of these  medicines today.  Per chart review, patient had a renal artery duplex in July 2023 that did not show any RAS.  He has never been tested for primary hyperaldosteronism, however given he is already on spironolactone, unable to test at this time as blood pressure control is imperative due to  pseudoaneurysm.  - Restart all of patient's home medications - If blood pressure remains elevated, plan to increase hydralazine to 100 mg 3 times daily  Lower GI bleed Patient states that for the last 3 days, he has been having bright red blood per rectum and mixed in to his stool.  It is painless and he is not having any abdominal pain either. This is most consistent with diverticular bleed. Low suspicion this is related to leg pseudoaneurysm.  Patient is hemodynamically stable with unchanged hemoglobin.   - CBC twice daily - CT abdomen/pelvis without contrast to evaluate for diverticulosis - If patient symptoms not self resolve, can consider consulting GI versus outpatient follow-up  Hypokalemia Likely in the setting of poor p.o. intake and daily alcohol use.  - S/p 40 mEq oral and 20 mEq IV - Recheck BMP this evening - Continue daily replacement as needed  Chronic alcohol abuse - CIWA without Ativan  Diabetes mellitus ([facility]) - SSI, moderate  Other chronic pancreatitis ([place]) - Patient states he is not taking his home Creon.  Will need outpatient follow-up with GI.  Advance Care Planning:   Code Status: Full Code verified by patient  Consults: Vascular surgery  Family Communication: No family at bedside  Severity of Illness: The appropriate patient status for this patient is INPATIENT. Inpatient status is judged to be reasonable and necessary in order to provide the required intensity of service to ensure the patient's safety. The patient's presenting symptoms, physical exam findings, and initial radiographic and laboratory data in the context of their chronic comorbidities is felt to place them  at high risk for further clinical deterioration. Furthermore, it is not anticipated that the patient will be medically stable for discharge from the hospital within 2 midnights of admission.   * I certify that at the point of admission it is my clinical judgment that the patient will require inpatient hospital care spanning beyond 2 midnights from the point of admission due to high intensity of service, high risk for further deterioration and high frequency of surveillance required.*  Author: Jose Persia, MD 05/04/2022 5:37 PM  For on call review [email or web address].

## 2022-05-04 NOTE — ED Triage Notes (Signed)
Pt states years ago he had a bypass where they used left leg and now has large raised area to left upper inner thigh. Denies any injury, also co bloody stools for few days. Is currently not on blood thinners.

## 2022-05-04 NOTE — ED Notes (Signed)
Neurovascular check to LLE: Skin to foot warm. Cap refill < 3 seconds. Sensation intact. Doppler pulse obtained to post tibial, unable to doppler dorsalis pedis pulse. Patient moves toes freely and denies pain. Hematoma noted to left thigh.

## 2022-05-04 NOTE — ED Provider Notes (Signed)
[facility] Provider Note    Event Date/Time   First MD Initiated Contact with Patient 05/04/22 0935     (approximate)   History   Wound Check   HPI  Donald Scott is a 72 y.o. male who comes in with 2 separate complaints.  #1 patient reports having bloody stools over the past few days not on any current blood thinners.  #2 patient reports he is having a large raised area to the left upper inner thigh where he had a bypass done.  Patient reports he is worried that his bypass has somehow failed and called states that the rectal bleeding.  He denies any history of rectal bleeding previously.  He does report a little bit of chest pain as well denies any headaches or falls or hitting his head.  He reports that he is not taking his blood pressure medicine this morning.  He denies ever having rectal bleeding in the past or ever needing a blood transfusion.  He also reports a little bit of chest discomfort but he states that this been going on for couple days now.  He reports that it has very minimal in nature just a small ache.  Denies any numbness or tingling in his arms or legs.   Physical Exam   Triage Vital Signs: ED Triage Vitals  Enc Vitals Group     BP 05/04/22 0903 126/72     Pulse Rate 05/04/22 0903 72     Resp 05/04/22 0903 18     Temp 05/04/22 0903 98.6 F (37 C)     Temp Source 05/04/22 0903 Oral     SpO2 05/04/22 0903 100 %     Weight 05/04/22 0904 157 lb (71.2 kg)     Height 05/04/22 0904 '5\' 7"'$  (1.702 m)     Head Circumference --      Peak Flow --      Pain Score 05/04/22 0904 0     Pain Loc --      Pain Edu? --      Excl. in [place]? --     Most recent vital signs: Vitals:   05/04/22 0903  BP: 126/72  Pulse: 72  Resp: 18  Temp: 98.6 F (37 C)  SpO2: 100%     General: Awake, no distress.  CV:  Good peripheral perfusion.  Resp:  Normal effort.  Abd:  No distention.  Soft and nontender Other:  Patient has about a baseball sized  mass on the left inner thigh.  He has got good warm well-perfused feet with 2+ distal pulse. Rectal exam has some bright red blood per rectum. No numbness or tingling.  No chest wall tenderness.  ED Results / Procedures / Treatments   Labs (all labs ordered are listed, but only abnormal results are displayed) Labs Reviewed  CBC - Abnormal; Notable for the following components:      Result Value   RBC 3.99 (*)    Hemoglobin 12.1 (*)    HCT 37.6 (*)    Platelets 123 (*)    All other components within normal limits  COMPREHENSIVE METABOLIC PANEL - Abnormal; Notable for the following components:   Sodium 134 (*)    Potassium 2.8 (*)    Glucose, Bld 198 (*)    Calcium 7.6 (*)    Anion gap 3 (*)    All other components within normal limits  MAGNESIUM  PROTIME-INR  TYPE AND SCREEN  TROPONIN I (HIGH SENSITIVITY)  EKG  My interpretation of EKG:  Normal sinus bradycardia rate of 59 without any ST elevation except for maybe a little bit in V2 V3 but that looks more like repolarization from LVH.  RADIOLOGY I have reviewed the xray personally and no signs of widened mediastinum   PROCEDURES:  Critical Care performed: No  .1-3 Lead EKG Interpretation  Performed by: Vanessa Mayodan, MD Authorized by: Vanessa , MD     Interpretation: normal     ECG rate:  60   ECG rate assessment: normal     Rhythm: sinus rhythm     Ectopy: none     Conduction: normal      MEDICATIONS ORDERED IN ED: Medications  potassium chloride SA (KLOR-CON M) CR tablet 40 mEq (has no administration in time range)  potassium chloride 10 mEq in 100 mL IVPB (has no administration in time range)  0.9 %  sodium chloride infusion (has no administration in time range)     IMPRESSION / MDM / ASSESSMENT AND PLAN / ED COURSE  I reviewed the triage vital signs and the nursing notes.   Patient's presentation is most consistent with acute presentation with potential threat to life or bodily function.    Patient comes in hypertensive is not taking of his home medications will do med rec to try to figure out what he is on given he is unclear.  Labs ordered to evaluate for ACS given some mild chest discomfort but this is going on for few days no numbness or tingling chest x-ray ordered evaluate for any evidence of widened mediastinum but low suspicion given no other signs of that.  For the rectal bleeding his hemoglobin is reassuring he has got some mild bright red blood on rectal exam.  Reviewed a note from 03/05/2020 where patient was seen by Dr. Vicente Males for rectal bleeding.  They note there that he has a history of alcohol induced pancreatitis and they have planned a colonoscopy.  I do not see that this was ever done.  Patient also has a history of hives even with the prep and therefore cannot undergo CT imaging with contrast to look for active GI bleed.  CMP shows a potassium of 2.8 creatinine normal Magnesium normal.  Troponin negative x2  Ultrasound concerning for hematoma versus pseudoaneurysm.  Discussed with the vascular team Dr Eulogio Ditch who discussed the case with Dr. Kirstie Mirza are aware the patient has a contrast allergy even with contrast prep but patient himself hardly remembers it and reports it was more just a burning sensation.  The patient is on the cardiac monitor to evaluate for evidence of arrhythmia and/or significant heart rate changes.      FINAL CLINICAL IMPRESSION(S) / ED DIAGNOSES   Final diagnoses:  Rectal bleeding  Leg hematoma, left, initial encounter  Hypokalemia     Rx / DC Orders   ED Discharge Orders     None        Note:  This document was prepared using Dragon voice recognition software and may include unintentional dictation errors.   Vanessa , MD 05/04/22 1352

## 2022-05-04 NOTE — Assessment & Plan Note (Addendum)
Potassium still on the lower side.  Patient on ACE and spironolactone.

## 2022-05-04 NOTE — Assessment & Plan Note (Signed)
-  Patient states he is not taking his home Creon.

## 2022-05-04 NOTE — Assessment & Plan Note (Signed)
Hemoglobin A1c back on 01/25/22 was 6.3.

## 2022-05-04 NOTE — Assessment & Plan Note (Addendum)
Hemoglobin up at 13.1 upon discharge.  Unclear if this is a real diagnosis with hemoglobin going up.

## 2022-05-04 NOTE — Assessment & Plan Note (Deleted)
Patient is presenting with a large lump overlying prior femoral-popliteal bypass, most consistent with likely pseudoaneurysm differential also includes hematoma.  Due to contrast allergy, will need to do prep with prednisone and Benadryl prior to CTA.  - Vascular surgery following; appreciate their recommendations - CTA ordered, likely tomorrow morning after completing 18-hour prednisone and Benadryl prep - Frequent neurovascular checks due to high risk for rupture in the setting of uncontrolled hypertension

## 2022-05-05 ENCOUNTER — Inpatient Hospital Stay: Payer: Medicare HMO

## 2022-05-05 ENCOUNTER — Encounter: Payer: Self-pay | Admitting: Internal Medicine

## 2022-05-05 DIAGNOSIS — E876 Hypokalemia: Secondary | ICD-10-CM | POA: Diagnosis not present

## 2022-05-05 DIAGNOSIS — I10 Essential (primary) hypertension: Secondary | ICD-10-CM | POA: Diagnosis not present

## 2022-05-05 DIAGNOSIS — T148XXA Other injury of unspecified body region, initial encounter: Secondary | ICD-10-CM

## 2022-05-05 DIAGNOSIS — E1121 Type 2 diabetes mellitus with diabetic nephropathy: Secondary | ICD-10-CM

## 2022-05-05 DIAGNOSIS — K922 Gastrointestinal hemorrhage, unspecified: Secondary | ICD-10-CM | POA: Diagnosis not present

## 2022-05-05 DIAGNOSIS — I739 Peripheral vascular disease, unspecified: Secondary | ICD-10-CM

## 2022-05-05 LAB — CBC WITH DIFFERENTIAL/PLATELET
Abs Immature Granulocytes: 0.03 10*3/uL (ref 0.00–0.07)
Basophils Absolute: 0 10*3/uL (ref 0.0–0.1)
Basophils Relative: 0 %
Eosinophils Absolute: 0 10*3/uL (ref 0.0–0.5)
Eosinophils Relative: 0 %
HCT: 39 % (ref 39.0–52.0)
Hemoglobin: 13.1 g/dL (ref 13.0–17.0)
Immature Granulocytes: 0 %
Lymphocytes Relative: 10 %
Lymphs Abs: 0.8 10*3/uL (ref 0.7–4.0)
MCH: 30.5 pg (ref 26.0–34.0)
MCHC: 33.6 g/dL (ref 30.0–36.0)
MCV: 90.9 fL (ref 80.0–100.0)
Monocytes Absolute: 0.1 10*3/uL (ref 0.1–1.0)
Monocytes Relative: 1 %
Neutro Abs: 7.2 10*3/uL (ref 1.7–7.7)
Neutrophils Relative %: 89 %
Platelets: 133 10*3/uL — ABNORMAL LOW (ref 150–400)
RBC: 4.29 MIL/uL (ref 4.22–5.81)
RDW: 12.4 % (ref 11.5–15.5)
Smear Review: NORMAL
WBC: 8.1 10*3/uL (ref 4.0–10.5)
nRBC: 0 % (ref 0.0–0.2)

## 2022-05-05 LAB — BASIC METABOLIC PANEL
Anion gap: 10 (ref 5–15)
BUN: 13 mg/dL (ref 8–23)
CO2: 19 mmol/L — ABNORMAL LOW (ref 22–32)
Calcium: 8.7 mg/dL — ABNORMAL LOW (ref 8.9–10.3)
Chloride: 104 mmol/L (ref 98–111)
Creatinine, Ser: 0.94 mg/dL (ref 0.61–1.24)
GFR, Estimated: >60 mL/min (ref >60)
Glucose, Bld: 202 mg/dL — ABNORMAL HIGH (ref 70–99)
Potassium: 3.2 mmol/L — ABNORMAL LOW (ref 3.5–5.1)
Sodium: 133 mmol/L — ABNORMAL LOW (ref 135–145)

## 2022-05-05 LAB — GLUCOSE, CAPILLARY: Glucose-Capillary: 178 mg/dL — ABNORMAL HIGH (ref 70–99)

## 2022-05-05 MED ORDER — IOHEXOL 350 MG/ML SOLN
125.0000 mL | Freq: Once | INTRAVENOUS | Status: AC | PRN
  Administered 2022-05-05: 125 mL via INTRAVENOUS

## 2022-05-05 MED ORDER — PNEUMOCOCCAL 20-VAL CONJ VACC 0.5 ML IM SUSY
0.5000 mL | PREFILLED_SYRINGE | INTRAMUSCULAR | Status: DC
  Filled 2022-05-05: qty 0.5

## 2022-05-05 MED ORDER — HYDRALAZINE HCL 100 MG PO TABS: 100.0000 mg | ORAL_TABLET | Freq: Three times a day (TID) | ORAL | 0 refills | Status: AC

## 2022-05-05 MED ORDER — AMLODIPINE BESYLATE 5 MG PO TABS: 5.0000 mg | ORAL_TABLET | Freq: Once | ORAL | Status: DC

## 2022-05-05 MED ORDER — AMLODIPINE BESYLATE 10 MG PO TABS: 10.0000 mg | ORAL_TABLET | Freq: Every day | ORAL | 0 refills | Status: DC

## 2022-05-05 NOTE — Discharge Summary (Signed)
Physician Discharge Summary   Patient: Donald Scott MRN: [id number] DOB: 1950-11-02  Admit date:     05/04/2022  date of signing out [place]: 05/05/22  Discharge Physician: Loletha Grayer   PCP: Mylinda Latina, PA-C   Recommendations at discharge:   Follow-up with your medical doctor 5 days Follow-up with vascular surgery  Discharge Diagnoses: Principal Problem:   Hematoma Active Problems:   Accelerated hypertension   Lower GI bleed   Hypokalemia   Chronic alcohol abuse   Controlled type 2 diabetes mellitus with diabetic nephropathy, without long-term current use of insulin (HCC)   Other chronic pancreatitis (HCC)   PVD (peripheral vascular disease) [facility])    Hospital Course: 72 year old man came in with swelling in his left leg.  Past medical history of type 2 diabetes mellitus, hypertension, chronic pancreatitis, peripheral vascular disease.  He presented to the ED with a left leg lump and rectal bleeding.  He states the lump fluctuates in size.  Patient told admitting physician that he had bright red blood per rectum in his stool.  His blood pressure was very elevated upon presentation.  1/31.  CT angiogram shows a hematoma along the left femoral to popliteal bypass graft no pseudoaneurysm or active extravasation.  Presumed chronic right SFA occlusion, severe narrowing of the left popliteal artery, coarse calcifications in the pancreas compatible with sequela of chronic pancreatitis.    Patient signed out [place]. Without further workup unable to tell if this really is a GI bleed or not and what that swelling is in his leg actually is.  With his blood pressure being elevated also higher risk of stroke.  Assessment and Plan: * Hematoma CT angiogram commented on a possible hematoma left leg.  Area feels very soft.  Not sure if this is a hematoma or not.  Vascular plans on doing an angiogram for further evaluation.  Patient signed out  [place].  Accelerated hypertension The patient states his blood pressure has been up for 35 years and he is not concerned about his high blood pressure.  I did increase his Norvasc to 10 mg daily and hydralazine to 100 mg 3 times a day even though he signed out [place].  Lower GI bleed Hemoglobin up at 13.1 upon discharge.  Unclear if this is a real diagnosis with hemoglobin going up.  Hypokalemia Potassium still on the lower side.  Patient on ACE and spironolactone.  Chronic alcohol abuse No signs of withdrawal while here.  Controlled type 2 diabetes mellitus with diabetic nephropathy, without long-term current use of insulin (HCC) Hemoglobin A1c back on 01/25/22 was 6.3.  Other chronic pancreatitis [facility]) - Patient states he is not taking his home [place].  PVD (peripheral vascular disease) ([place]) Follow-up with vascular as outpatient.         Consultants: Vascular surgery Procedures performed: None Disposition: Home Diet recommendation:  Cardiac diet DISCHARGE MEDICATION: Allergies as of 05/05/2022       Reactions   Iodinated Contrast Media Other (See Comments)   Other Reaction: SOB, hives, flushing   Other Hives, Other (See Comments), Rash   Uncoded Allergy. Allergen: CT Dye Uncoded Allergy. Allergen: ISOVUE 300, Other Reaction: Itching (even w/prep) Other reaction(s): Other (See Comments) Uncoded Allergy. Allergen: ISOVUE 300, Other Reaction: Itching (even w/prep) Uncoded Allergy. Allergen: CT Dye   Penicillin G Other (See Comments)        Medication List     STOP taking these medications  meloxicam 7.5 MG tablet Commonly known as: MOBIC   Zenpep 10000-32000 units Cpep Generic drug: Pancrelipase (Lip-Prot-Amyl)       TAKE these medications    albuterol 108 (90 Base) MCG/ACT inhaler Commonly known as: VENTOLIN HFA INHALE 1 PUFF INTO THE LUNGS EVERY 6 HOURS AS NEEDED FOR WHEEZING OR SHORTNESS OF BREATH What changed:  See the new instructions.   Alcohol Swabs Pads Use with cleaning area to test blood sugar twice a day   allopurinol 100 MG tablet Commonly known as: ZYLOPRIM Take 1 tablet (100 mg total) by mouth daily.   amLODipine 10 MG tablet Commonly known as: NORVASC Take 1 tablet (10 mg total) by mouth daily. What changed:  medication strength how much to take   escitalopram 10 MG tablet Commonly known as: LEXAPRO TAKE 1 TABLET(10 MG) BY MOUTH DAILY What changed:  how much to take how to take this when to take this   fluticasone 50 MCG/ACT nasal spray Commonly known as: FLONASE Place 1 spray into both nostrils as needed.   hydrALAZINE 100 MG tablet Commonly known as: APRESOLINE Take 1 tablet (100 mg total) by mouth 3 (three) times daily. What changed:  medication strength how much to take   losartan-hydrochlorothiazide 100-25 MG tablet Commonly known as: HYZAAR Take 1 tablet by mouth daily.   omeprazole 40 MG capsule Commonly known as: PRILOSEC Take 40 mg by mouth daily.   rosuvastatin 5 MG tablet Commonly known as: Crestor Take 1 tablet (5 mg total) by mouth daily.   spironolactone 25 MG tablet Commonly known as: ALDACTONE Take 1 tablet (25 mg total) by mouth daily.   True Metrix Level 1 Low Soln Use as directed to check blood sugar twice a day TRUE METRIX LEVEL 1 CTRL SOLN   TRUEplus Lancets 30G Misc Use to test blood sugar twice a day        Follow-up Information     McDonough, Lauren K, PA-C Follow up in 5 day(s).   Specialty: Physician Assistant Contact information: [place] [address] [phone]         Schnier, Dolores Lory, MD Follow up in 2 week(s).   Specialties: Vascular Surgery, Cardiology, Radiology, Vascular Surgery Why: hospital follow up Contact information: [place] [address] [phone]                Discharge Exam: Filed Weights   05/04/22 0904 05/05/22 0750  Weight: 71.2 kg 66.2 kg    Physical Exam HENT:     Head: Normocephalic.     Mouth/Throat:     Pharynx: No oropharyngeal exudate.  Eyes:     General: Lids are normal.     Conjunctiva/sclera: Conjunctivae normal.  Cardiovascular:     Rate and Rhythm: Normal rate and regular rhythm.     Heart sounds: Normal heart sounds, S1 normal and S2 normal.  Pulmonary:     Breath sounds: No decreased breath sounds, wheezing, rhonchi or rales.  Abdominal:     Palpations: Abdomen is soft.     Tenderness: There is no abdominal tenderness.  Musculoskeletal:     Right lower leg: No swelling.     Left lower leg: No swelling.     Comments: Left inner thigh swelling midway down the leg.  Almost spongy feeling.  Skin:    General: Skin is warm.     Findings: No rash.  Neurological:     Mental Status: He is alert and oriented to person, place, and  time.      Condition at discharge: fair  The results of significant diagnostics from this hospitalization (including imaging, microbiology, ancillary and laboratory) are listed below for reference.   Imaging Studies: CT ANGIO AO+BIFEM W & OR WO CONTRAST  Result Date: 05/05/2022 CLINICAL DATA:  Aneurysm, pelvis or lower extremity abscess versus pseudoaneurysm near the bypass graft. After receiving 13 hour prep patient had no reaction to contrast. EXAM: CT ANGIOGRAPHY OF ABDOMINAL AORTA WITH ILIOFEMORAL RUNOFF TECHNIQUE: Multidetector CT imaging of the abdomen, pelvis and lower extremities was performed using the standard protocol during bolus administration of intravenous contrast. Multiplanar CT image reconstructions and MIPs were obtained to evaluate the vascular anatomy. RADIATION DOSE REDUCTION: This exam was performed according to the departmental dose-optimization program which includes automated exposure control, adjustment of the mA and/or kV according to patient size and/or use of iterative reconstruction technique. CONTRAST:  15m OMNIPAQUE IOHEXOL 350 MG/ML SOLN COMPARISON:   Lower extremity ultrasound 05/04/2022 and CT abdomen and pelvis 05/04/2022 FINDINGS: VASCULAR Aorta: Normal caliber aorta without aneurysm, dissection, vasculitis or significant stenosis. Mixed density atherosclerotic plaque causes at most mild narrowing. Celiac: Patent without evidence of aneurysm, dissection, vasculitis or significant stenosis. SMA: Patent without evidence of aneurysm, dissection, vasculitis or significant stenosis. Renals: Mixed density plaque in the proximal renal arteries causes severe right and moderate left renal artery narrowing. Accessory left renal artery containing mixed density plaque with severe narrowing. IMA: Patent.  Plaque at the origin causes severe narrowing. RIGHT Lower Extremity Inflow: Mixed density plaque causes multifocal areas of up to severe narrowing. Patent right external iliac stent. Severe narrowing at the distal end of the stent. Outflow: The SFA is occluded with prominent collateral filling of the popliteal artery from profunda femoral artery collaterals. Runoff: Patent three-vessel runoff of the right lower extremity LEFT Lower Extremity Inflow: Mixed density plaque causes up to severe narrowing in the internal iliac artery. Patent external iliac artery stent. Outflow: Femoral to popliteal bypass graft is patent. There is severe narrowing of the popliteal artery. Occluded native superficial femoral artery. Heterogenous loculated fluid collection within the left vastus medialis muscle measuring 5.2 x 5.0 cm and abutting the fem-pop bypass graft. No evidence of active extravasation or pseudoaneurysm. This is compatible with hematoma. The hematoma tracks superiorly along the bypass graft to the origin to the origin of the graft at the common femoral artery. Runoff: Single-vessel runoff of the left lower extremity through the posterior tibial artery. The peroneal artery is only visualized at its origin. The anterior tibial artery is visualized to the mid calf. Veins: No  obvious venous abnormality within the limitations of this arterial phase study. Review of the MIP images confirms the above findings. NON-VASCULAR Lower chest: No acute abnormality. Hepatobiliary: No focal liver abnormality is seen. No gallstones, gallbladder wall thickening, or biliary dilatation. Pancreas: Coarse calcifications in the pancreas. Cystic lesion in the tail of the pancreas measuring 2.2 cm and compatible with a pseudocyst. This was previously evaluated with MRI 05/19/2016. Spleen: Unremarkable. Adrenals/Urinary Tract: Normal adrenal glands. Bilateral cortical renal scarring. No renal calculi or hydronephrosis. Unremarkable bladder. Stomach/Bowel: Normal caliber large and small bowel. Normal appendix. Unremarkable stomach. Lymphatic: No suspicious lymphadenopathy. Reproductive: Unremarkable. Other: No free intraperitoneal fluid or air. Musculoskeletal: No acute fracture. IMPRESSION: VASCULAR Hematoma along the left femoral to popliteal bypass graft. No pseudoaneurysm or active extravasation. Patent bilateral external iliac stents. 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  Patent three-vessel runoff of the right lower extremity. Severe narrowing of the left popliteal artery. Single-vessel runoff of the left lower extremity through the posterior tibial artery. NON-VASCULAR Coarse calcifications in the pancreas compatible with sequela of chronic pancreatitis. Electronically Signed   By: Placido Sou M.D.   On: 05/05/2022 04:03   CT ABDOMEN PELVIS WO CONTRAST  Result Date: 05/04/2022 CLINICAL DATA:  Diverticulitis. EXAM: CT ABDOMEN AND PELVIS WITHOUT CONTRAST TECHNIQUE: Multidetector CT imaging of the abdomen and pelvis was performed following the standard protocol without IV contrast. RADIATION DOSE REDUCTION: This exam was performed according to the departmental dose-optimization program which  includes automated exposure control, adjustment of the mA and/or kV according to patient size and/or use of iterative reconstruction technique. COMPARISON:  CT 2012. Ultrasound kidneys 10/13/2021. MRI 2018 and older FINDINGS: Lower chest: Breathing motion along the lung bases and upper abdomen. There is some dependent atelectasis or scarring at the lung bases. No pleural effusion. Heart is slightly enlarged. Trace pericardial fluid. Coronary artery calcifications are seen. Hepatobiliary: On this non IV contrast exam no clear space-occupying liver lesion. There is a tiny cysts suggested in segment 4 on series 2, image 12 measuring 8 mm. Gallbladder is present and nondilated. Pancreas: Extensive calcifications along the pancreas with atrophy and ductal dilatation. Please correlate for chronic calcific pancreatitis. The duct dilatation is similar to previous. On the MRI there were some areas of potential filling defects in the dilated pancreatic duct with calcification. Spleen: Spleen is nonenlarged. Adrenals/Urinary Tract: Adrenal glands are grossly preserved and unchanged. Minimal thickening of the left adrenal gland is stable. No abnormal calcifications seen within either kidney nor along the course of either ureter. Preserved contours of the urinary bladder. Stomach/Bowel: The sigmoid colon is redundant course into the central abdomen with a very slight twist along its course. Mesenteric vessels also slightly turn, nonspecific. The large bowel overall is nondilated with scattered colonic stool. Few scattered diverticula identified diffusely along the colon. Appendix extends medial to the cecum in the right lower quadrant. Appendix is best seen on for example series 5, image 44 coronal datasets. Stomach is underdistended. Previously there was some fold thickening along the stomach which again is suggested. Please correlate with symptoms. Small bowel is nondilated. Vascular/Lymphatic: Extensive vascular  calcifications are identified including along several branch vessels suggestive of significant stenosis such as renal arteries, iliac vessels. There are bilateral external iliac artery stents. Reproductive: Prostate is unremarkable. Other: Mild anasarca. If there is further concern, contrast study can be performed to further delineate for the patient's symptoms. Musculoskeletal: Curvature of the spine with scattered degenerative changes. Bridging osteophytes along the SI joints. IMPRESSION: 1. No bowel obstruction, free air or free fluid.  Normal appendix. 2. Few colonic diverticula without inflammatory changes at this time on this noncontrast exam no CT sequelae of diverticulitis. 3. Chronic calcific pancreatitis. 4. Extensive vascular calcifications including along several branch vessels suggestive of significant stenosis such as renal arteries, iliac vessels. 5. Persistent gastric fold thickening as on prior. 6. Redundant course to the nondilated sigmoid colon with a slight twist, nonspecific. Again no dilatation. Electronically Signed   By: Jill Side M.D.   On: 05/04/2022 16:22   [place] Lower Ext Art Left Ltd  Result Date: 05/04/2022 CLINICAL DATA:  Leg swelling, mass EXAM: LEFT LOWER EXTREMITY ARTERIAL DUPLEX SCAN TECHNIQUE: Gray-scale sonography as well as color Doppler and duplex ultrasound was performed to evaluate the left lower extremity including vascular evaluation COMPARISON:  None Available. FINDINGS: Patent  bypass graft from the common femoral artery to the distal SFA. There is a complex fluid collection measuring up to 5 cm in transverse diameter, greater than 7 cm in length adjacent to the graft in the thigh. Color Doppler shows arterial phase flow external to the graft in the collection suggesting possible leak/pseudoaneurysm. IMPRESSION: Large hematoma or pseudoaneurysm adjacent to fem-distal graft. Electronically Signed   By: Lucrezia Europe M.D.   On: 05/04/2022 12:34   DG Chest Portable 1  View  Result Date: 05/04/2022 CLINICAL DATA:  Chest pain EXAM: PORTABLE CHEST 1 VIEW COMPARISON:  11/12/2021 FINDINGS: Heart and mediastinal contours are within normal limits. No focal opacities or effusions. No acute bony abnormality. Aortic atherosclerosis. IMPRESSION: No active disease. Electronically Signed   By: Rolm Baptise M.D.   On: 05/04/2022 10:34    Microbiology: Results for orders placed or performed during the hospital encounter of 11/12/21  SARS Coronavirus 2 by RT PCR (hospital order, performed in [facility] hospital lab) *cepheid single result test* Anterior Nasal Swab     Status: None   Collection Time: 11/12/21  1:11 PM   Specimen: Anterior Nasal Swab  Result Value Ref Range Status   SARS Coronavirus 2 by RT PCR NEGATIVE NEGATIVE Final    Comment: (NOTE) SARS-CoV-2 target nucleic acids are NOT DETECTED.  The SARS-CoV-2 RNA is generally detectable in upper and lower respiratory specimens during the acute phase of infection. The lowest concentration of SARS-CoV-2 viral copies this assay can detect is 250 copies / mL. A negative result does not preclude SARS-CoV-2 infection and should not be used as the sole basis for treatment or other patient management decisions.  A negative result may occur with improper specimen collection / handling, submission of specimen other than nasopharyngeal swab, presence of viral mutation(s) within the areas targeted by this assay, and inadequate number of viral copies (<250 copies / mL). A negative result must be combined with clinical observations, patient history, and epidemiological information.  Fact Sheet for Patients:   https://www.patel.info/  Fact Sheet for Healthcare Providers: https://hall.com/  This test is not yet approved or  cleared by the [place] FDA and has been authorized for detection and/or diagnosis of SARS-CoV-2 by FDA under an Emergency Use Authorization (EUA).   This EUA will remain in effect (meaning this test can be used) for the duration of the COVID-19 declaration under Section 564(b)(1) of the Act, 21 U.S.C. section 360bbb-3(b)(1), unless the authorization is terminated or revoked sooner.  Performed at [facility], [address] [place], [place] [address]     Labs: CBC: Recent Labs  Lab 05/04/22 [phone] 05/04/22 2050 05/05/22 0210  WBC 6.9 8.0 8.1  NEUTROABS  --   --  7.2  HGB 12.1* 12.2* 13.1  HCT 37.6* 37.5* 39.0  MCV 94.2 92.8 90.9  PLT 123* 122* 338*   Basic Metabolic Panel: Recent Labs  Lab 05/04/22 0907 05/04/22 2050 05/05/22 0210  NA 134* 132* 133*  K 2.8* 3.4* 3.2*  CL 107 104 104  CO2 24 20* 19*  GLUCOSE 198* 219* 202*  BUN '13 13 13  '$ CREATININE 1.05 0.87 0.94  CALCIUM 7.6* 8.2* 8.7*  MG 2.1  --   --    Liver Function Tests: Recent Labs  Lab 05/04/22 0907  AST 30  ALT 30  ALKPHOS 77  BILITOT 0.9  PROT 7.1  ALBUMIN 3.9   CBG: Recent Labs  Lab 05/04/22 1642 05/05/22 0842  GLUCAP 127* 178*  Discharge time spent: greater than 30 minutes.  Signed: Loletha Grayer, MD [facility] 05/05/2022

## 2022-05-05 NOTE — Progress Notes (Signed)
Progress Note    05/05/2022 12:09 PM * No surgery found *  Subjective:  Donald Scott is a 72 y.o. 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   After evaluation yesterday patient agreed to be premedicated for a cat scan of his abdomen and pelvis due to a severe dye allergy to be completed the next day.   This morning he wishes to leave stating " I don't have time to stay for this today"     Vitals:   05/05/22 0443 05/05/22 0750  BP: (!) 192/88 (!) 180/102  Pulse: 87 98  Resp: 18 18  Temp: 98.1 F (36.7 C) 98 F (36.7 C)  SpO2: 97% 100%   Physical Exam: Cardiac:  RRR  Lungs:  Clear with non productive cough. Incisions:  None Extremities:  Left lower extremity with Lump/hematoma/seroma in thigh area where prior graft surgery was completed.  Abdomen:  Positive bowel sounds, soft, NT/ND Neurologic: AAOX3 Agitated this morning  CBC    Component Value Date/Time   WBC 8.1 05/05/2022 0210   RBC 4.29 05/05/2022 0210   HGB 13.1 05/05/2022 0210   HGB 13.8 12/17/2019 0910   HCT 39.0 05/05/2022 0210   HCT 43.7 12/17/2019 0910   PLT 133 (L) 05/05/2022 0210   PLT 159 12/17/2019 0910   MCV 90.9 05/05/2022 0210   MCV 94 12/17/2019 0910   MCV 94 04/30/2014 1142   MCH 30.5 05/05/2022 0210   MCHC 33.6 05/05/2022 0210   RDW 12.4 05/05/2022 0210   RDW 11.4 (L) 12/17/2019 0910   RDW 13.1 04/30/2014 1142   LYMPHSABS 0.8 05/05/2022 0210   LYMPHSABS 1.8 04/30/2014 1142   MONOABS 0.1 05/05/2022 0210   MONOABS 0.6 04/30/2014 1142   EOSABS 0.0 05/05/2022 0210   EOSABS 0.1 04/30/2014 1142   BASOSABS 0.0 05/05/2022 0210   BASOSABS 0.0 04/30/2014 1142    BMET    Component Value Date/Time   NA 133 (L) 05/05/2022 0210   NA 136 12/17/2019 0910   K 3.2 (L) 05/05/2022 0210   CL 104 05/05/2022 0210   CO2 19 (L) 05/05/2022  0210   GLUCOSE 202 (H) 05/05/2022 0210   BUN 13 05/05/2022 0210   BUN 16 12/17/2019 0910   CREATININE 0.94 05/05/2022 0210   CALCIUM 8.7 (L) 05/05/2022 0210   GFRNONAA >60 05/05/2022 0210   GFRAA 78 12/17/2019 0910    INR    Component Value Date/Time   INR 1.1 05/04/2022 1110   INR 1.0 04/30/2014 1142     Intake/Output Summary (Last 24 hours) at 05/05/2022 1209 Last data filed at 05/05/2022 1023 Gross per 24 hour  Intake 977.7 ml  Output 1250 ml  Net -272.3 ml     Assessment/Plan:  72 y.o. male is s/p CT of the Abdomen and pelvis.  * No surgery found *   Patient wishes to leave this morning without have any procedure. I had a long discussion with the patient regarding his need for renal angiogram and how this corollates to his high blood pressure. I discussed in detail how he may have a life ending stroke with his current blood pressure. I also discussed in detail with him the need for a left lower extremity angiogram to rule out having possible aneurysm and hematoma in his left lower leg specifically his thigh. I discussed with him  in detail in the event of a sudden rupture of the artery could be life threatening. He refused to stay for either procedure stating " I need to get home to take care of the dogs and pick up my 55 year old after school". He agreed to come see Vascular surgery in the clinic next week and set up a date for the above stated procedures. He proceeded to sign out AMA.   Dr Ella Jubilee was made aware of the situation. Both he and the vascular lab were made aware of the patients leaving AMA today and the need to cancel his case tomorrow morning.    Drema Pry [facility] 05/05/2022 12:09 PM

## 2022-05-05 NOTE — Progress Notes (Signed)
Pt left AMA. Discussed he will f/u outpatient with Cardiologist. Pt left with belongings and has a car in the parking lot.

## 2022-05-05 NOTE — Assessment & Plan Note (Signed)
CT angiogram commented on a possible hematoma left leg.  Area feels very soft.  Not sure if this is a hematoma or not.  Vascular plans on doing an angiogram for further evaluation.  Patient signed out [place].

## 2022-05-05 NOTE — Assessment & Plan Note (Signed)
Follow-up with vascular as outpatient.

## 2022-05-05 NOTE — Plan of Care (Signed)

## 2022-05-06 SURGERY — LOWER EXTREMITY INTERVENTION
Anesthesia: Moderate Sedation | Laterality: Right

## 2022-05-10 ENCOUNTER — Ambulatory Visit (INDEPENDENT_AMBULATORY_CARE_PROVIDER_SITE_OTHER): Payer: Medicare HMO | Admitting: Physician Assistant

## 2022-05-10 ENCOUNTER — Encounter: Payer: Self-pay | Admitting: Physician Assistant

## 2022-05-10 VITALS — BP 150/88 | HR 81 | Temp 98.0°F | Resp 16 | Ht 67.0 in | Wt 158.0 lb

## 2022-05-10 DIAGNOSIS — I10 Essential (primary) hypertension: Secondary | ICD-10-CM | POA: Diagnosis not present

## 2022-05-10 DIAGNOSIS — T148XXA Other injury of unspecified body region, initial encounter: Secondary | ICD-10-CM

## 2022-05-10 DIAGNOSIS — I700 Atherosclerosis of aorta: Secondary | ICD-10-CM

## 2022-05-10 DIAGNOSIS — Z09 Encounter for follow-up examination after completed treatment for conditions other than malignant neoplasm: Secondary | ICD-10-CM

## 2022-05-10 DIAGNOSIS — I739 Peripheral vascular disease, unspecified: Secondary | ICD-10-CM | POA: Diagnosis not present

## 2022-05-10 NOTE — Progress Notes (Signed)
[facility] [place], [place] [address]  Internal MEDICINE  Office Visit Note  Patient Name: Donald Scott  [id number]  [id number]  Date of Service: 05/10/2022     Chief Complaint  Patient presents with   Hospitalization Follow-up   Hypertension     HPI Pt is here for recent hospital follow up. -Pt went to ED on 1/30 and was admitted, but left AMA on 1/31 after 1 night stay. 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 He states things were taking too long to get done and felt like they just wanted to keep him longer than necessary and he decided to leave. -Discussed the reason behind his admission and importance of follow up as OP then if not going through ED. -He did have a CTA of abdominal aorta with iliofemoral runoff to further evaluate concern for hematoma vs pseudoaneurysm from [place] along LLE. He was previously seen in office on 04/22/22 for this same swelling over previous graft on LLE and was referred to ED from office on that date, but pt delayed going until 1/30 visit. Based on imaging was determined to be hematoma with recommendation to see vascular surgery for this and other chronic changes seen. -he also complained of rectal bleeding upon ED visit, but states this was only for one day and then resolved and has not occurred again. He has not been back to see GI in awhile -Took his BP meds and didnt smoke a cig before coming in today and BP much better than last visit, though still elevated. Pt admits he gets impatient and his BP will rise instead of lower when given time to sit and "relax." He does state he hasn't picked up increased hydralazine yet that the ED sent for '100mg'$  TID and will get this now and monitor. -BP at home not checked since hospital visit.  CTA 05/05/22: IMPRESSION: VASCULAR   Hematoma along the left femoral to popliteal bypass graft. No pseudoaneurysm or active  extravasation.   Patent bilateral external iliac stents.   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 Patent three-vessel runoff of the right lower extremity.   Severe narrowing of the left popliteal artery. Single-vessel runoff of the left lower extremity through the posterior tibial artery.   NON-VASCULAR   Coarse calcifications in the pancreas compatible with sequela of chronic pancreatitis.  CT abdomen pelvis 05/04/22: IMPRESSION: 1. No bowel obstruction, free air or free fluid.  Normal appendix. 2. Few colonic diverticula without inflammatory changes at this time on this noncontrast exam no CT sequelae of diverticulitis. 3. Chronic calcific pancreatitis. 4. Extensive vascular calcifications including along several branch vessels suggestive of significant stenosis such as renal arteries, iliac vessels. 5. Persistent gastric fold thickening as on prior. 6. Redundant course to the nondilated sigmoid colon with a slight twist, nonspecific. Again no dilatation.  Current Medication: Outpatient Encounter Medications as of 05/10/2022  Medication Sig   albuterol (VENTOLIN HFA) 108 (90 Base) MCG/ACT inhaler INHALE 1 PUFF INTO THE LUNGS EVERY 6 HOURS AS NEEDED FOR WHEEZING OR SHORTNESS OF BREATH (Patient taking differently: Inhale 1 puff into the lungs every 6 (six) hours as needed for wheezing or shortness of breath.)   Alcohol Swabs PADS Use with cleaning area to test blood sugar twice a day   allopurinol (ZYLOPRIM) 100 MG tablet Take 1  tablet (100 mg total) by mouth daily.   amLODipine (NORVASC) 10 MG tablet Take 1 tablet (10 mg total) by mouth daily.   Blood Glucose Calibration (TRUE METRIX LEVEL 1) Low SOLN Use as directed to check blood sugar twice a day TRUE METRIX LEVEL 1 CTRL SOLN   escitalopram (LEXAPRO) 10 MG tablet TAKE 1 TABLET(10 MG) BY MOUTH DAILY (Patient taking  differently: Take 10 mg by mouth daily. TAKE 1 TABLET(10 MG) BY MOUTH DAILY)   fluticasone (FLONASE) 50 MCG/ACT nasal spray Place 1 spray into both nostrils as needed.   hydrALAZINE (APRESOLINE) 100 MG tablet Take 1 tablet (100 mg total) by mouth 3 (three) times daily.   losartan-hydrochlorothiazide (HYZAAR) 100-25 MG tablet Take 1 tablet by mouth daily.   omeprazole (PRILOSEC) 40 MG capsule Take 40 mg by mouth daily.   rosuvastatin (CRESTOR) 5 MG tablet Take 1 tablet (5 mg total) by mouth daily.   spironolactone (ALDACTONE) 25 MG tablet Take 1 tablet (25 mg total) by mouth daily.   TRUEplus Lancets 30G MISC Use to test blood sugar twice a day   No facility-administered encounter medications on file as of 05/10/2022.    Surgical History: Past Surgical History:  Procedure Laterality Date   CAROTID STENT  2010   Vascular bypass graft      Medical History: Past Medical History:  Diagnosis Date   Diabetes mellitus ([place]) 05/22/2015   Gout 05/22/2015   Hypertension     Family History: Family History  Problem Relation Age of Onset   Arthritis Mother     Social History   Socioeconomic History   Marital status: Married    Spouse name: Not on file   Number of children: Not on file   Years of education: Not on file   Highest education level: Not on file  Occupational History   Not on file  Tobacco Use   Smoking status: Every Day    Packs/day: 1.00    Years: 55.00    Total pack years: 55.00    Types: Cigarettes   Smokeless tobacco: Never   Tobacco comments:    1 pack daily  Substance and Sexual Activity   Alcohol use: Yes    Alcohol/week: 18.0 standard drinks of alcohol    Types: 18 Cans of beer per week   Drug use: No   Sexual activity: Not on file  Other Topics Concern   Not on file  Social History Narrative   Not on file   Social Determinants of Health   Financial Resource Strain: Not on file  Food Insecurity: No Food Insecurity (05/05/2022)   Hunger Vital Sign     Worried About Running Out of Food in the Last Year: Never true    Ran Out of Food in the Last Year: Never true  Transportation Needs: No Transportation Needs (05/05/2022)   PRAPARE - Hydrologist (Medical): No    Lack of Transportation (Non-Medical): No  Physical Activity: Not on file  Stress: Not on file  Social Connections: Not on file  Intimate Partner Violence: Not At Risk (05/05/2022)   Humiliation, Afraid, Rape, and Kick questionnaire    Fear of Current or Ex-Partner: No    Emotionally Abused: No    Physically Abused: No    Sexually Abused: No      Review of Systems  Constitutional:  Negative for chills, fatigue and unexpected weight change.  HENT:  Negative for congestion, postnasal drip, rhinorrhea, sneezing and sore  throat.   Eyes:  Negative for redness.  Respiratory:  Negative for cough, chest tightness and shortness of breath.   Cardiovascular:  Positive for leg swelling. Negative for chest pain and palpitations.  Gastrointestinal:  Negative for abdominal pain, constipation, diarrhea, nausea and vomiting.  Genitourinary:  Negative for dysuria and frequency.  Musculoskeletal:  Negative for arthralgias, back pain, joint swelling and neck pain.  Skin:  Negative for rash.  Neurological: Negative.  Negative for tremors and numbness.  Hematological:  Negative for adenopathy. Does not bruise/bleed easily.  Psychiatric/Behavioral:  Negative for behavioral problems (Depression), sleep disturbance and suicidal ideas. The patient is nervous/anxious.     Vital Signs: BP (!) 150/88   Pulse 81   Temp 98 F (36.7 C)   Resp 16   Ht '5\' 7"'$  (1.702 m)   Wt 158 lb (71.7 kg)   SpO2 99%   BMI 24.75 kg/m    Physical Exam Vitals and nursing note reviewed.  Constitutional:      General: He is not in acute distress.    Appearance: He is well-developed and normal weight. He is not diaphoretic.  HENT:     Head: Normocephalic and atraumatic.      Mouth/Throat:     Pharynx: No oropharyngeal exudate.  Eyes:     Pupils: Pupils are equal, round, and reactive to light.  Neck:     Thyroid: No thyromegaly.     Vascular: No JVD.     Trachea: No tracheal deviation.  Cardiovascular:     Rate and Rhythm: Normal rate and regular rhythm.     Heart sounds: Normal heart sounds. No murmur heard.    No friction rub. No gallop.  Pulmonary:     Effort: Pulmonary effort is normal. No respiratory distress.     Breath sounds: No wheezing or rales.  Chest:     Chest wall: No tenderness.  Abdominal:     General: Bowel sounds are normal.     Palpations: Abdomen is soft.  Musculoskeletal:        General: Normal range of motion.     Cervical back: Normal range of motion and neck supple.  Lymphadenopathy:     Cervical: No cervical adenopathy.  Skin:    General: Skin is warm and dry.  Neurological:     Mental Status: He is alert and oriented to person, place, and time.     Cranial Nerves: No cranial nerve deficit.  Psychiatric:        Behavior: Behavior normal.        Thought Content: Thought content normal.        Judgment: Judgment normal.       Assessment/Plan: 1. Hospital discharge follow-up Reviewed hospital course despite pt leaving AMA, will move forward with vascular referral as OP  2. Hematoma - Ambulatory referral to Vascular Surgery  3. Peripheral vascular disease, unspecified ([place]) - Ambulatory referral to Vascular Surgery  4. Uncontrolled hypertension Is picking up increased hydralazine dose and will continue on other medications as before and start monitoring at home  5. Aortic atherosclerosis ([place]) Continue crestor, will establish with vascular   General Counseling: Jakwon verbalizes understanding of the findings of todays visit and agrees with plan of treatment. I have discussed any further diagnostic evaluation that may be needed or ordered today. We also reviewed his medications today. he has been encouraged to  call the office with any questions or concerns that should arise related to todays visit.    Counseling:  Orders Placed This Encounter  Procedures   Ambulatory referral to Vascular Surgery    This patient was seen by Drema Dallas, PA-C in collaboration with Dr. Clayborn Bigness as a part of collaborative care agreement.   I have reviewed all medical records from hospital follow up including radiology reports and consults from other physicians. Appropriate follow up diagnostics will be scheduled as needed. Patient/ Family understands the plan of treatment. Time spent 40 minutes.   Dr Lavera Guise, MD Internal Medicine

## 2022-05-17 ENCOUNTER — Ambulatory Visit: Payer: Medicare HMO | Admitting: Internal Medicine

## 2022-06-01 ENCOUNTER — Encounter: Payer: Self-pay | Admitting: Internal Medicine

## 2022-06-01 ENCOUNTER — Ambulatory Visit (INDEPENDENT_AMBULATORY_CARE_PROVIDER_SITE_OTHER): Payer: Medicare HMO | Admitting: Internal Medicine

## 2022-06-01 VITALS — BP 150/70 | HR 78 | Temp 98.0°F | Resp 16 | Ht 67.0 in | Wt 156.0 lb

## 2022-06-01 DIAGNOSIS — J209 Acute bronchitis, unspecified: Secondary | ICD-10-CM | POA: Diagnosis not present

## 2022-06-01 DIAGNOSIS — R2242 Localized swelling, mass and lump, left lower limb: Secondary | ICD-10-CM

## 2022-06-01 DIAGNOSIS — J440 Chronic obstructive pulmonary disease with acute lower respiratory infection: Secondary | ICD-10-CM

## 2022-06-01 DIAGNOSIS — E1159 Type 2 diabetes mellitus with other circulatory complications: Secondary | ICD-10-CM

## 2022-06-01 DIAGNOSIS — I10 Essential (primary) hypertension: Secondary | ICD-10-CM

## 2022-06-01 LAB — POCT GLYCOSYLATED HEMOGLOBIN (HGB A1C): Hemoglobin A1C: 6.5 % — AB (ref 4.0–5.6)

## 2022-06-01 MED ORDER — LEVOFLOXACIN 500 MG PO TABS: 500.0000 mg | ORAL_TABLET | Freq: Every day | ORAL | 0 refills | Status: DC

## 2022-06-01 NOTE — Progress Notes (Signed)
[facility] [place], [place] [address]  Internal MEDICINE  Office Visit Note  Patient Name: Donald Scott  [id number]  [id number]  Date of Service: 06/16/2022  Chief Complaint  Patient presents with   Follow-up   Diabetes   Hypertension    HPI Pt is here for routine follow-up for multiple medical problems 1.  Patient has been having groin pain has history of PAD status post femoropopliteal bypass about 10 years ago he went to ED with left leg pain and a lump increasing in size, on ultrasound/CT was found to have a hematoma, patient is scheduled to have appointment with vascular surgeon 2.  Accelerated hypertension blood pressure is improving he is on hydralazine 100 3 times daily along with other blood pressure medications 3. Diabetes under good control 4.  Is complaining of cough and chest tightness has been using his inhaler quite frequently   Current Medication: Outpatient Encounter Medications as of 06/01/2022  Medication Sig   albuterol (VENTOLIN HFA) 108 (90 Base) MCG/ACT inhaler INHALE 1 PUFF INTO THE LUNGS EVERY 6 HOURS AS NEEDED FOR WHEEZING OR SHORTNESS OF BREATH (Patient taking differently: Inhale 1 puff into the lungs every 6 (six) hours as needed for wheezing or shortness of breath.)   Alcohol Swabs PADS Use with cleaning area to test blood sugar twice a day   allopurinol (ZYLOPRIM) 100 MG tablet Take 1 tablet (100 mg total) by mouth daily.   amLODipine (NORVASC) 10 MG tablet Take 1 tablet (10 mg total) by mouth daily.   Blood Glucose Calibration (TRUE METRIX LEVEL 1) Low SOLN Use as directed to check blood sugar twice a day TRUE METRIX LEVEL 1 CTRL SOLN   escitalopram (LEXAPRO) 10 MG tablet TAKE 1 TABLET(10 MG) BY MOUTH DAILY (Patient taking differently: Take 10 mg by mouth daily. TAKE 1 TABLET(10 MG) BY MOUTH DAILY)   fluticasone (FLONASE) 50 MCG/ACT nasal spray Place 1 spray into both nostrils as needed.   hydrALAZINE (APRESOLINE) 100 MG  tablet Take 1 tablet (100 mg total) by mouth 3 (three) times daily.   levofloxacin (LEVAQUIN) 500 MG tablet Take 1 tablet (500 mg total) by mouth daily.   losartan-hydrochlorothiazide (HYZAAR) 100-25 MG tablet Take 1 tablet by mouth daily.   omeprazole (PRILOSEC) 40 MG capsule Take 40 mg by mouth daily.   rosuvastatin (CRESTOR) 5 MG tablet Take 1 tablet (5 mg total) by mouth daily.   spironolactone (ALDACTONE) 25 MG tablet Take 1 tablet (25 mg total) by mouth daily.   TRUEplus Lancets 30G MISC Use to test blood sugar twice a day   No facility-administered encounter medications on file as of 06/01/2022.    Surgical History: Past Surgical History:  Procedure Laterality Date   CAROTID STENT  2010   Vascular bypass graft      Medical History: Past Medical History:  Diagnosis Date   Diabetes mellitus ([place]) 05/22/2015   Gout 05/22/2015   Hypertension     Family History: Family History  Problem Relation Age of Onset   Arthritis Mother     Social History   Socioeconomic History   Marital status: Married    Spouse name: Not on file   Number of children: Not on file   Years of education: Not on file   Highest education level: Not on file  Occupational History   Not on file  Tobacco Use   Smoking status: Every Day    Packs/day: 1.00    Years: 55.00  Total pack years: 55.00    Types: Cigarettes   Smokeless tobacco: Never   Tobacco comments:    1 pack daily  Substance and Sexual Activity   Alcohol use: Yes    Alcohol/week: 18.0 standard drinks of alcohol    Types: 18 Cans of beer per week   Drug use: No   Sexual activity: Not on file  Other Topics Concern   Not on file  Social History Narrative   Not on file   Social Determinants of Health   Financial Resource Strain: Not on file  Food Insecurity: No Food Insecurity (05/05/2022)   Hunger Vital Sign    Worried About Running Out of Food in the Last Year: Never true    Ran Out of Food in the Last Year: Never true   Transportation Needs: No Transportation Needs (05/05/2022)   PRAPARE - Hydrologist (Medical): No    Lack of Transportation (Non-Medical): No  Physical Activity: Not on file  Stress: Not on file  Social Connections: Not on file  Intimate Partner Violence: Not At Risk (05/05/2022)   Humiliation, Afraid, Rape, and Kick questionnaire    Fear of Current or Ex-Partner: No    Emotionally Abused: No    Physically Abused: No    Sexually Abused: No      Review of Systems  Constitutional:  Negative for fatigue and fever.  HENT:  Negative for congestion, mouth sores and postnasal drip.   Respiratory:  Positive for cough and shortness of breath.   Cardiovascular:  Negative for chest pain.  Genitourinary:  Negative for flank pain.  Musculoskeletal:  Positive for arthralgias.  Skin:        Left groin hematoma questionable pseudoaneurysm  Psychiatric/Behavioral: Negative.      Vital Signs: BP (!) 150/70   Pulse 78   Temp 98 F (36.7 C)   Resp 16   Ht '5\' 7"'$  (1.702 m)   Wt 156 lb (70.8 kg)   SpO2 98%   BMI 24.43 kg/m    Physical Exam Constitutional:      Appearance: Normal appearance.  HENT:     Head: Normocephalic and atraumatic.     Nose: Nose normal.     Mouth/Throat:     Mouth: Mucous membranes are moist.     Pharynx: No posterior oropharyngeal erythema.  Eyes:     Extraocular Movements: Extraocular movements intact.     Pupils: Pupils are equal, round, and reactive to light.  Cardiovascular:     Pulses: Normal pulses.     Heart sounds: Normal heart sounds.  Pulmonary:     Effort: Pulmonary effort is normal.     Breath sounds: Wheezing present.  Skin:    Findings: Lesion present.  Neurological:     General: No focal deficit present.     Mental Status: He is alert.  Psychiatric:        Mood and Affect: Mood normal.        Behavior: Behavior normal.        Assessment/Plan: 1. Type 2 diabetes mellitus with other circulatory  complication, without long-term current use of insulin ([place]) Continue monitoring his diabetes it is improving he was unable to void no microalbumin was done today - POCT HgB A1C, 6.3 improving  2. Localized swelling, mass, or lump of left lower extremity Patient is scheduled to see vascular for further recommendation history of femoral - pop bypass  3. Uncontrolled hypertension Patient is to continue on  hydralazine 100 mg up to 3 times a day, Norvasc 10 mg, he is also on maximum dose of hydralazine along with spironolactone need to monitor his a basic metabolic panel to assess his kidney function.  With potassium level  4. Acute bronchitis with COPD ([place]) Patient is to continue to use his inhaler, might need PFTs and checks x-ray if symptoms do not improve - levofloxacin (LEVAQUIN) 500 MG tablet; Take 1 tablet (500 mg total) by mouth daily.  Dispense: 10 tablet; Refill: 0   General Counseling: Zacherie verbalizes understanding of the findings of todays visit and agrees with plan of treatment. I have discussed any further diagnostic evaluation that may be needed or ordered today. We also reviewed his medications today. he has been encouraged to call the office with any questions or concerns that should arise related to todays visit.    Orders Placed This Encounter  Procedures   Urine Microalbumin w/creat. ratio   POCT HgB A1C    Meds ordered this encounter  Medications   levofloxacin (LEVAQUIN) 500 MG tablet    Sig: Take 1 tablet (500 mg total) by mouth daily.    Dispense:  10 tablet    Refill:  0    Total time spent:45 Minutes Time spent includes review of chart, medications, test results, and follow up plan with the patient.   [place] Controlled Substance Database was reviewed by me.   Dr Lavera Guise Internal medicine

## 2022-06-03 ENCOUNTER — Encounter (INDEPENDENT_AMBULATORY_CARE_PROVIDER_SITE_OTHER): Payer: Self-pay | Admitting: Vascular Surgery

## 2022-06-03 ENCOUNTER — Ambulatory Visit (INDEPENDENT_AMBULATORY_CARE_PROVIDER_SITE_OTHER): Payer: Medicare HMO | Admitting: Vascular Surgery

## 2022-06-03 VITALS — BP 152/71 | HR 76 | Resp 18 | Ht 67.0 in | Wt 150.6 lb

## 2022-06-03 DIAGNOSIS — E785 Hyperlipidemia, unspecified: Secondary | ICD-10-CM | POA: Insufficient documentation

## 2022-06-03 DIAGNOSIS — E1121 Type 2 diabetes mellitus with diabetic nephropathy: Secondary | ICD-10-CM

## 2022-06-03 DIAGNOSIS — I10 Essential (primary) hypertension: Secondary | ICD-10-CM | POA: Diagnosis not present

## 2022-06-03 DIAGNOSIS — E782 Mixed hyperlipidemia: Secondary | ICD-10-CM | POA: Diagnosis not present

## 2022-06-03 DIAGNOSIS — T829XXS Unspecified complication of cardiac and vascular prosthetic device, implant and graft, sequela: Secondary | ICD-10-CM

## 2022-06-03 DIAGNOSIS — I70213 Atherosclerosis of native arteries of extremities with intermittent claudication, bilateral legs: Secondary | ICD-10-CM | POA: Diagnosis not present

## 2022-06-03 DIAGNOSIS — T829XXA Unspecified complication of cardiac and vascular prosthetic device, implant and graft, initial encounter: Secondary | ICD-10-CM | POA: Insufficient documentation

## 2022-06-03 DIAGNOSIS — I70219 Atherosclerosis of native arteries of extremities with intermittent claudication, unspecified extremity: Secondary | ICD-10-CM | POA: Insufficient documentation

## 2022-06-03 NOTE — Progress Notes (Signed)
MRN : [id number]  Donald Scott is a 72 y.o. (1950-06-08) male who presents with chief complaint of check circulation.  History of Present Illness:   The patient returns to the office for followup and review of the noninvasive studies.  He has PAD and is s/p fem-pop bypass he thinks it was placed maybe 10 years ago.  Recently he presented to the [facility] ED with complaints of left leg lump that was increasing in size.  This prompted a CT scan which showed a patent femoral-popliteal bypass with single-vessel peroneal runoff.  A fluid collection adjacent to the bypass was noted which is reported as a hematoma.  I have personally reviewed this scan and concur that there is a fluid collection adjacent to the bypass however I am not convinced that this is a hematoma.  I do agree there is no evidence for graft disruption pseudoaneurysm or other ongoing hemorrhagic event.  Patient feels that the "lump has gone down".  The patient notes that there has not been a significant deterioration in the lower extremity symptoms.  The patient notes his walking distance is about the same and he denies development of rest pain symptoms. No new ulcers or wounds have occurred since the last visit.  There have been no significant changes to the patient's overall health care.  The patient denies amaurosis fugax or recent TIA symptoms. There are no recent neurological changes noted. There is no history of DVT, PE or superficial thrombophlebitis. The patient denies recent episodes of angina or shortness of breath.    No outpatient medications have been marked as taking for the 06/03/22 encounter (Appointment) with Delana Meyer, Dolores Lory, MD.    Past Medical History:  Diagnosis Date   Diabetes mellitus ([place]) 05/22/2015   Gout 05/22/2015   Hypertension     Past Surgical History:  Procedure Laterality Date   CAROTID STENT  2010   Vascular bypass graft      Social  History Social History   Tobacco Use   Smoking status: Every Day    Packs/day: 1.00    Years: 55.00    Total pack years: 55.00    Types: Cigarettes   Smokeless tobacco: Never   Tobacco comments:    1 pack daily  Substance Use Topics   Alcohol use: Yes    Alcohol/week: 18.0 standard drinks of alcohol    Types: 18 Cans of beer per week   Drug use: No    Family History Family History  Problem Relation Age of Onset   Arthritis Mother     Allergies  Allergen Reactions   Iodinated Contrast Media Other (See Comments)    Other Reaction: SOB, hives, flushing   Other Hives, Other (See Comments) and Rash    Uncoded Allergy. Allergen: CT Dye Uncoded Allergy. Allergen: ISOVUE 300, Other Reaction: Itching (even w/prep) Other reaction(s): Other (See Comments) Uncoded Allergy. Allergen: ISOVUE 300, Other Reaction: Itching (even w/prep) Uncoded Allergy. Allergen: CT Dye    Penicillin G Other (See Comments)     REVIEW OF SYSTEMS (Negative unless checked)  Constitutional: '[]'$ Weight loss  '[]'$ Fever  '[]'$ Chills Cardiac: '[]'$ Chest pain   '[]'$ Chest pressure   '[]'$ Palpitations   '[]'$ Shortness of breath when laying flat   '[]'$ Shortness of breath with exertion. Vascular:  '[x]'$ Pain in legs with walking   '[]'$ Pain in legs at rest  '[]'$ History of DVT   '[]'$ Phlebitis   '[]'$ Swelling  in legs   '[]'$ Varicose veins   '[]'$ Non-healing ulcers Pulmonary:   '[]'$ Uses home oxygen   '[]'$ Productive cough   '[]'$ Hemoptysis   '[]'$ Wheeze  '[]'$ COPD   '[]'$ Asthma Neurologic:  '[]'$ Dizziness   '[]'$ Seizures   '[]'$ History of stroke   '[]'$ History of TIA  '[]'$ Aphasia   '[]'$ Vissual changes   '[]'$ Weakness or numbness in arm   '[]'$ Weakness or numbness in leg Musculoskeletal:   '[]'$ Joint swelling   '[]'$ Joint pain   '[]'$ Low back pain Hematologic:  '[]'$ Easy bruising  '[]'$ Easy bleeding   '[]'$ Hypercoagulable state   '[]'$ Anemic Gastrointestinal:  '[]'$ Diarrhea   '[]'$ Vomiting  '[]'$ Gastroesophageal reflux/heartburn   '[]'$ Difficulty swallowing. Genitourinary:  '[]'$ Chronic kidney disease   '[]'$ Difficult urination   '[]'$ Frequent urination   '[]'$ Blood in urine Skin:  '[]'$ Rashes   '[]'$ Ulcers  Psychological:  '[]'$ History of anxiety   '[]'$  History of major depression.  Physical Examination  There were no vitals filed for this visit. There is no height or weight on file to calculate BMI. Gen: WD/WN, NAD Head: [place]/AT, No temporalis wasting.  Ear/Nose/Throat: Hearing grossly intact, nares w/o erythema or drainage Eyes: PER, EOMI, sclera nonicteric.  Neck: Supple, no masses.  No bruit or JVD.  Pulmonary:  Good air movement, no audible wheezing, no use of accessory muscles.  Cardiac: RRR, normal S1, S2, no Murmurs. Vascular: Smooth firm mass 6 mm x 4 mm nontender medial thigh no skin changes associated with it.  Feet demonstrate mild trophic changes, no open wounds Vessel Right Left  Radial Palpable Palpable  PT Not Palpable Not Palpable  DP Not Palpable Not Palpable  Gastrointestinal: soft, non-distended. No guarding/no peritoneal signs.  Musculoskeletal: M/S 5/5 throughout.  No visible deformity.  Neurologic: CN 2-12 intact. Pain and light touch intact in extremities.  Symmetrical.  Speech is fluent. Motor exam as listed above. Psychiatric: Judgment intact, Mood & affect appropriate for pt's clinical situation. Dermatologic: No rashes or ulcers noted.  No changes consistent with cellulitis.   CBC Lab Results  Component Value Date   WBC 8.1 05/05/2022   HGB 13.1 05/05/2022   HCT 39.0 05/05/2022   MCV 90.9 05/05/2022   PLT 133 (L) 05/05/2022    BMET    Component Value Date/Time   NA 133 (L) 05/05/2022 0210   NA 136 12/17/2019 0910   K 3.2 (L) 05/05/2022 0210   CL 104 05/05/2022 0210   CO2 19 (L) 05/05/2022 0210   GLUCOSE 202 (H) 05/05/2022 0210   BUN 13 05/05/2022 0210   BUN 16 12/17/2019 0910   CREATININE 0.94 05/05/2022 0210   CALCIUM 8.7 (L) 05/05/2022 0210   GFRNONAA >60 05/05/2022 0210   GFRAA 78 12/17/2019 0910   CrCl cannot be calculated (Patient's most recent lab result is older than the  maximum 21 days allowed.).  COAG Lab Results  Component Value Date   INR 1.1 05/04/2022   INR 1.0 04/30/2014    Radiology CT ANGIO AO+BIFEM W & OR WO CONTRAST  Result Date: 05/05/2022 CLINICAL DATA:  Aneurysm, pelvis or lower extremity abscess versus pseudoaneurysm near the bypass graft. After receiving 13 hour prep patient had no reaction to contrast. EXAM: CT ANGIOGRAPHY OF ABDOMINAL AORTA WITH ILIOFEMORAL RUNOFF TECHNIQUE: Multidetector CT imaging of the abdomen, pelvis and lower extremities was performed using the standard protocol during bolus administration of intravenous contrast. Multiplanar CT image reconstructions and MIPs were obtained to evaluate the vascular anatomy. RADIATION DOSE REDUCTION: This exam was performed according to the departmental dose-optimization program which includes automated exposure control, adjustment of the  mA and/or kV according to patient size and/or use of iterative reconstruction technique. CONTRAST:  143m OMNIPAQUE IOHEXOL 350 MG/ML SOLN COMPARISON:  Lower extremity ultrasound 05/04/2022 and CT abdomen and pelvis 05/04/2022 FINDINGS: VASCULAR Aorta: Normal caliber aorta without aneurysm, dissection, vasculitis or significant stenosis. Mixed density atherosclerotic plaque causes at most mild narrowing. Celiac: Patent without evidence of aneurysm, dissection, vasculitis or significant stenosis. SMA: Patent without evidence of aneurysm, dissection, vasculitis or significant stenosis. Renals: Mixed density plaque in the proximal renal arteries causes severe right and moderate left renal artery narrowing. Accessory left renal artery containing mixed density plaque with severe narrowing. IMA: Patent.  Plaque at the origin causes severe narrowing. RIGHT Lower Extremity Inflow: Mixed density plaque causes multifocal areas of up to severe narrowing. Patent right external iliac stent. Severe narrowing at the distal end of the stent. Outflow: The SFA is occluded with  prominent collateral filling of the popliteal artery from profunda femoral artery collaterals. Runoff: Patent three-vessel runoff of the right lower extremity LEFT Lower Extremity Inflow: Mixed density plaque causes up to severe narrowing in the internal iliac artery. Patent external iliac artery stent. Outflow: Femoral to popliteal bypass graft is patent. There is severe narrowing of the popliteal artery. Occluded native superficial femoral artery. Heterogenous loculated fluid collection within the left vastus medialis muscle measuring 5.2 x 5.0 cm and abutting the fem-pop bypass graft. No evidence of active extravasation or pseudoaneurysm. This is compatible with hematoma. The hematoma tracks superiorly along the bypass graft to the origin to the origin of the graft at the common femoral artery. Runoff: Single-vessel runoff of the left lower extremity through the posterior tibial artery. The peroneal artery is only visualized at its origin. The anterior tibial artery is visualized to the mid calf. Veins: No obvious venous abnormality within the limitations of this arterial phase study. Review of the MIP images confirms the above findings. NON-VASCULAR Lower chest: No acute abnormality. Hepatobiliary: No focal liver abnormality is seen. No gallstones, gallbladder wall thickening, or biliary dilatation. Pancreas: Coarse calcifications in the pancreas. Cystic lesion in the tail of the pancreas measuring 2.2 cm and compatible with a pseudocyst. This was previously evaluated with MRI 05/19/2016. Spleen: Unremarkable. Adrenals/Urinary Tract: Normal adrenal glands. Bilateral cortical renal scarring. No renal calculi or hydronephrosis. Unremarkable bladder. Stomach/Bowel: Normal caliber large and small bowel. Normal appendix. Unremarkable stomach. Lymphatic: No suspicious lymphadenopathy. Reproductive: Unremarkable. Other: No free intraperitoneal fluid or air. Musculoskeletal: No acute fracture. IMPRESSION: VASCULAR  Hematoma along the left femoral to popliteal bypass graft. No pseudoaneurysm or active extravasation. Patent bilateral external iliac stents. 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 Patent three-vessel runoff of the right lower extremity. Severe narrowing of the left popliteal artery. Single-vessel runoff of the left lower extremity through the posterior tibial artery. NON-VASCULAR Coarse calcifications in the pancreas compatible with sequela of chronic pancreatitis. Electronically Signed   By: TPlacido SouM.D.   On: 05/05/2022 04:03   CT ABDOMEN PELVIS WO CONTRAST  Result Date: 05/04/2022 CLINICAL DATA:  Diverticulitis. EXAM: CT ABDOMEN AND PELVIS WITHOUT CONTRAST TECHNIQUE: Multidetector CT imaging of the abdomen and pelvis was performed following the standard protocol without IV contrast. RADIATION DOSE REDUCTION: This exam was performed according to the departmental dose-optimization program which includes automated exposure control, adjustment of the mA and/or kV according to patient size and/or use of iterative reconstruction technique. COMPARISON:  CT 2012. Ultrasound kidneys 10/13/2021. MRI 2018 and older  FINDINGS: Lower chest: Breathing motion along the lung bases and upper abdomen. There is some dependent atelectasis or scarring at the lung bases. No pleural effusion. Heart is slightly enlarged. Trace pericardial fluid. Coronary artery calcifications are seen. Hepatobiliary: On this non IV contrast exam no clear space-occupying liver lesion. There is a tiny cysts suggested in segment 4 on series 2, image 12 measuring 8 mm. Gallbladder is present and nondilated. Pancreas: Extensive calcifications along the pancreas with atrophy and ductal dilatation. Please correlate for chronic calcific pancreatitis. The duct dilatation is similar to previous. On the MRI there were some areas of potential filling  defects in the dilated pancreatic duct with calcification. Spleen: Spleen is nonenlarged. Adrenals/Urinary Tract: Adrenal glands are grossly preserved and unchanged. Minimal thickening of the left adrenal gland is stable. No abnormal calcifications seen within either kidney nor along the course of either ureter. Preserved contours of the urinary bladder. Stomach/Bowel: The sigmoid colon is redundant course into the central abdomen with a very slight twist along its course. Mesenteric vessels also slightly turn, nonspecific. The large bowel overall is nondilated with scattered colonic stool. Few scattered diverticula identified diffusely along the colon. Appendix extends medial to the cecum in the right lower quadrant. Appendix is best seen on for example series 5, image 44 coronal datasets. Stomach is underdistended. Previously there was some fold thickening along the stomach which again is suggested. Please correlate with symptoms. Small bowel is nondilated. Vascular/Lymphatic: Extensive vascular calcifications are identified including along several branch vessels suggestive of significant stenosis such as renal arteries, iliac vessels. There are bilateral external iliac artery stents. Reproductive: Prostate is unremarkable. Other: Mild anasarca. If there is further concern, contrast study can be performed to further delineate for the patient's symptoms. Musculoskeletal: Curvature of the spine with scattered degenerative changes. Bridging osteophytes along the SI joints. IMPRESSION: 1. No bowel obstruction, free air or free fluid.  Normal appendix. 2. Few colonic diverticula without inflammatory changes at this time on this noncontrast exam no CT sequelae of diverticulitis. 3. Chronic calcific pancreatitis. 4. Extensive vascular calcifications including along several branch vessels suggestive of significant stenosis such as renal arteries, iliac vessels. 5. Persistent gastric fold thickening as on prior. 6.  Redundant course to the nondilated sigmoid colon with a slight twist, nonspecific. Again no dilatation. Electronically Signed   By: Jill Side M.D.   On: 05/04/2022 16:22     Assessment/Plan 1. Atherosclerosis of native artery of both lower extremities with intermittent claudication (HCC)  Recommend:  The patient has evidence of atherosclerosis of the lower extremities with claudication.  The patient does not voice lifestyle limiting changes at this point in time.  The patient notes that there seems to have been a decrease in size of this mass.  It is nontender.  Given that the bypass has been present for nearly a decade although graft infection is not impossible it certainly seems less likely.  Given the lack of associated symptoms I would like to follow this and we will see him back in 3 months to remeasure the size of the fluid collection.  I have asked that he return sooner if it continues to increase in size.  CT scan obtained recently at [facility] does not suggest clinically significant change.  It does also note occlusion of the SFA with three-vessel runoff on the right side.  The patient did mention that he would like to have his right leg fixed.  He states his inability to walk on that side  is quite disabling.  I asked that we will readdress that once we figure out his left leg situation.  No invasive studies, angiography or surgery at this time The patient should continue walking and begin a more formal exercise program.  The patient should continue antiplatelet therapy and aggressive treatment of the lipid abnormalities  No changes in the patient's medications at this time  Continued surveillance is indicated as atherosclerosis is likely to progress with time.    The patient will continue follow up with noninvasive studies as ordered.  - VAS [place] ABI WITH/WO TBI; Future - VAS [place] LOWER EXTREMITY ARTERIAL DUPLEX; Future  2. Complication associated with vascular device,  unspecified complication, sequela See #1 - VAS [place] LOWER EXTREMITY ARTERIAL DUPLEX; Future  3. Accelerated hypertension Continue antihypertensive medications as already ordered, these medications have been reviewed and there are no changes at this time.  4. Controlled type 2 diabetes mellitus with diabetic nephropathy, without long-term current use of insulin ([place]) Continue hypoglycemic medications as already ordered, these medications have been reviewed and there are no changes at this time.  Hgb A1C to be monitored as already arranged by primary service  5. Mixed hyperlipidemia Continue statin as ordered and reviewed, no changes at this time    Hortencia Pilar, MD  06/03/2022 12:45 PM

## 2022-06-04 ENCOUNTER — Encounter (INDEPENDENT_AMBULATORY_CARE_PROVIDER_SITE_OTHER): Payer: Self-pay | Admitting: Vascular Surgery

## 2022-07-12 ENCOUNTER — Ambulatory Visit: Payer: Medicare HMO | Admitting: Physician Assistant

## 2022-07-22 ENCOUNTER — Ambulatory Visit (INDEPENDENT_AMBULATORY_CARE_PROVIDER_SITE_OTHER): Payer: Medicare HMO | Admitting: Physician Assistant

## 2022-07-22 ENCOUNTER — Encounter: Payer: Self-pay | Admitting: Physician Assistant

## 2022-07-22 VITALS — BP 142/84 | HR 63 | Temp 97.8°F | Resp 16 | Ht 67.0 in | Wt 154.0 lb

## 2022-07-22 DIAGNOSIS — I739 Peripheral vascular disease, unspecified: Secondary | ICD-10-CM | POA: Diagnosis not present

## 2022-07-22 DIAGNOSIS — I10 Essential (primary) hypertension: Secondary | ICD-10-CM | POA: Diagnosis not present

## 2022-07-22 DIAGNOSIS — E1159 Type 2 diabetes mellitus with other circulatory complications: Secondary | ICD-10-CM

## 2022-07-22 LAB — POCT UA - MICROALBUMIN
Albumin/Creatinine Ratio, Urine, POC: >34
Creatinine, POC: 8.8 mg/dL
Microalbumin Ur, POC: 150 mg/L

## 2022-07-22 MED ORDER — MELOXICAM 7.5 MG PO TABS: 7.5000 mg | ORAL_TABLET | Freq: Every day | ORAL | 2 refills | Status: DC

## 2022-07-22 NOTE — Progress Notes (Signed)
[facility] [address] 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 Understands not using if not needed  Current Medication: Outpatient Encounter Medications as of 07/22/2022  Medication Sig   albuterol (VENTOLIN HFA) 108 (90 Base) MCG/ACT inhaler INHALE 1 PUFF INTO THE LUNGS EVERY 6 HOURS AS NEEDED FOR WHEEZING OR SHORTNESS OF BREATH (Patient taking differently: Inhale 1 puff into the lungs every 6 (six) hours as needed for wheezing or shortness of breath.)   Alcohol Swabs PADS Use with cleaning area to test blood sugar twice a day   allopurinol (ZYLOPRIM) 100 MG tablet Take 1 tablet (100 mg total) by mouth daily.   amLODipine (NORVASC) 10 MG tablet Take 1 tablet (10 mg total) by mouth daily.   Blood Glucose Calibration (TRUE METRIX LEVEL 1) Low SOLN Use as directed to check blood sugar twice a day TRUE METRIX LEVEL 1 CTRL SOLN   escitalopram (LEXAPRO) 10 MG tablet TAKE 1 TABLET(10 MG) BY MOUTH DAILY (Patient taking differently: Take 10 mg by mouth daily. TAKE 1 TABLET(10 MG) BY MOUTH DAILY)   fluticasone (FLONASE) 50 MCG/ACT nasal spray Place 1 spray into both nostrils as needed.   hydrALAZINE (APRESOLINE) 100 MG tablet Take 1 tablet (100 mg total) by mouth 3 (three) times daily.   levofloxacin (LEVAQUIN) 500 MG tablet Take 1 tablet (500 mg total) by mouth daily.   losartan-hydrochlorothiazide (HYZAAR) 100-25 MG tablet Take 1 tablet  by mouth daily.   meloxicam (MOBIC) 7.5 MG tablet Take 1 tablet (7.5 mg total) by mouth daily. As needed.   omeprazole (PRILOSEC) 40 MG capsule Take 40 mg by mouth daily.   rosuvastatin (CRESTOR) 5 MG tablet Take 1 tablet (5 mg total) by mouth daily.   spironolactone (ALDACTONE) 25 MG tablet Take 1 tablet (25 mg total) by mouth daily.   TRUEplus Lancets 30G MISC Use to test blood sugar twice a day   ZENPEP 10000-32000 units CPEP Take by mouth.   No facility-administered encounter medications on file as of 07/22/2022.    Surgical History: Past Surgical History:  Procedure Laterality Date   CAROTID STENT  2010   Vascular bypass graft      Medical History: Past Medical History:  Diagnosis Date   Diabetes mellitus 05/22/2015   Gout 05/22/2015   Hypertension     Family History: Family History  Problem Relation Age of Onset   Arthritis Mother     Social History   Socioeconomic History   Marital status: Married    Spouse name: Not on file   Number of children: Not on file   Years of education: Not on file   Highest education level: Not on file  Occupational History   Not on file  Tobacco Use   Smoking status: Every Day    Packs/day: 1.00    Years: 55.00    Additional pack years: 0.00  Total pack years: 55.00    Types: Cigarettes   Smokeless tobacco: Never   Tobacco comments:    1 pack daily  Substance and Sexual Activity   Alcohol use: Yes    Alcohol/week: 18.0 standard drinks of alcohol    Types: 18 Cans of beer per week   Drug use: No   Sexual activity: Not on file  Other Topics Concern   Not on file  Social History Narrative   Not on file   Social Determinants of Health   Financial Resource Strain: Not on file  Food Insecurity: No Food Insecurity (05/05/2022)   Hunger Vital Sign    Worried About Running Out of Food in the Last Year: Never true    Ran Out of Food in the Last Year: Never true  Transportation Needs: No Transportation Needs (05/05/2022)    PRAPARE - Administrator, Civil Service (Medical): No    Lack of Transportation (Non-Medical): No  Physical Activity: Not on file  Stress: Not on file  Social Connections: Not on file  Intimate Partner Violence: Not At Risk (05/05/2022)   Humiliation, Afraid, Rape, and Kick questionnaire    Fear of Current or Ex-Partner: No    Emotionally Abused: No    Physically Abused: No    Sexually Abused: No      Review of Systems  Constitutional:  Negative for chills, fatigue and unexpected weight change.  HENT:  Negative for congestion, postnasal drip, rhinorrhea, sneezing and sore throat.   Eyes:  Negative for redness.  Respiratory:  Negative for cough, chest tightness and shortness of breath.   Cardiovascular:  Negative for chest pain and palpitations.  Gastrointestinal:  Negative for abdominal pain, constipation, diarrhea, nausea and vomiting.  Genitourinary:  Negative for dysuria and frequency.  Musculoskeletal:  Negative for arthralgias, back pain, joint swelling and neck pain.  Skin:  Negative for rash.  Neurological: Negative.  Negative for tremors and numbness.  Hematological:  Negative for adenopathy. Does not bruise/bleed easily.  Psychiatric/Behavioral:  Negative for behavioral problems (Depression), sleep disturbance and suicidal ideas. The patient is nervous/anxious.     Vital Signs: BP (!) 142/84   Pulse 63   Temp 97.8 F (36.6 C)   Resp 16   Ht  (1.702 m)   Wt 154 lb (69.9 kg)   SpO2 99%   BMI 24.12 kg/m    Physical Exam Vitals and nursing note reviewed.  Constitutional:      General: He is not in acute distress.    Appearance: He is well-developed and normal weight. He is not diaphoretic.  HENT:     Head: Normocephalic and atraumatic.     Mouth/Throat:     Pharynx: No oropharyngeal exudate.  Eyes:     Pupils: Pupils are equal, round, and reactive to light.  Neck:     Thyroid: No thyromegaly.     Vascular: No JVD.     Trachea: No tracheal  deviation.  Cardiovascular:     Rate and Rhythm: Normal rate and regular rhythm.     Heart sounds: Normal heart sounds. No murmur heard.    No friction rub. No gallop.  Pulmonary:     Effort: Pulmonary effort is normal. No respiratory distress.     Breath sounds: No wheezing or rales.  Chest:     Chest wall: No tenderness.  Abdominal:     General: Bowel sounds are normal.     Palpations: Abdomen is soft.  Musculoskeletal:  General: Normal range of motion.     Cervical back: Normal range of motion and neck supple.  Lymphadenopathy:     Cervical: No cervical adenopathy.  Skin:    General: Skin is warm and dry.  Neurological:     Mental Status: He is alert and oriented to person, place, and time.     Cranial Nerves: No cranial nerve deficit.  Psychiatric:        Behavior: Behavior normal.        Thought Content: Thought content normal.        Judgment: Judgment normal.        Assessment/Plan: 1. Type 2 diabetes mellitus with other circulatory complication, without long-term current use of insulin Continue to work on diet and exercise - POCT UA - Microalbumin  2. Uncontrolled hypertension Much improved, continue current medications  3. Peripheral vascular disease, unspecified Followed by vascular   General Counseling: Mayo verbalizes understanding of the findings of todays visit and agrees with plan of treatment. I have discussed any further diagnostic evaluation that may be needed or ordered today. We also reviewed his medications today. he has been encouraged to call the office with any questions or concerns that should arise related to todays visit.    Orders Placed This Encounter  Procedures   POCT UA - Microalbumin    Meds ordered this encounter  Medications   meloxicam (MOBIC) 7.5 MG tablet    Sig: Take 1 tablet (7.5 mg total) by mouth daily. As needed.    Dispense:  30 tablet    Refill:  2    This patient was seen by Lynn Ito, PA-C in  collaboration with Dr. Beverely Risen as a part of collaborative care agreement.   Total time spent:30 Minutes Time spent includes review of chart, medications, test results, and follow up plan with the patient.      Dr Lyndon Code Internal medicine

## 2022-08-05 ENCOUNTER — Other Ambulatory Visit: Payer: Self-pay | Admitting: Physician Assistant

## 2022-08-05 DIAGNOSIS — I700 Atherosclerosis of aorta: Secondary | ICD-10-CM

## 2022-08-05 DIAGNOSIS — I10 Essential (primary) hypertension: Secondary | ICD-10-CM

## 2022-08-06 ENCOUNTER — Other Ambulatory Visit: Payer: Self-pay

## 2022-08-07 ENCOUNTER — Other Ambulatory Visit: Payer: Self-pay | Admitting: Internal Medicine

## 2022-08-07 DIAGNOSIS — F411 Generalized anxiety disorder: Secondary | ICD-10-CM

## 2022-08-27 ENCOUNTER — Other Ambulatory Visit: Payer: Self-pay | Admitting: Physician Assistant

## 2022-08-27 DIAGNOSIS — I10 Essential (primary) hypertension: Secondary | ICD-10-CM

## 2022-09-01 ENCOUNTER — Ambulatory Visit (INDEPENDENT_AMBULATORY_CARE_PROVIDER_SITE_OTHER): Payer: Medicare HMO | Admitting: Nurse Practitioner

## 2022-09-01 ENCOUNTER — Encounter (INDEPENDENT_AMBULATORY_CARE_PROVIDER_SITE_OTHER): Payer: Medicare HMO

## 2022-11-09 DIAGNOSIS — E119 Type 2 diabetes mellitus without complications: Secondary | ICD-10-CM | POA: Diagnosis not present

## 2022-11-09 DIAGNOSIS — H52223 Regular astigmatism, bilateral: Secondary | ICD-10-CM | POA: Diagnosis not present

## 2022-11-09 DIAGNOSIS — H524 Presbyopia: Secondary | ICD-10-CM | POA: Diagnosis not present

## 2022-11-09 DIAGNOSIS — H2513 Age-related nuclear cataract, bilateral: Secondary | ICD-10-CM | POA: Diagnosis not present

## 2022-11-22 ENCOUNTER — Encounter: Payer: Self-pay | Admitting: Physician Assistant

## 2022-11-22 ENCOUNTER — Ambulatory Visit: Payer: Medicare HMO | Admitting: Physician Assistant

## 2022-11-22 ENCOUNTER — Ambulatory Visit (INDEPENDENT_AMBULATORY_CARE_PROVIDER_SITE_OTHER): Payer: Medicare HMO | Admitting: Physician Assistant

## 2022-11-22 VITALS — BP 178/88 | HR 93 | Temp 98.4°F | Resp 16 | Ht 67.0 in | Wt 159.4 lb

## 2022-11-22 DIAGNOSIS — F17218 Nicotine dependence, cigarettes, with other nicotine-induced disorders: Secondary | ICD-10-CM | POA: Diagnosis not present

## 2022-11-22 DIAGNOSIS — E782 Mixed hyperlipidemia: Secondary | ICD-10-CM

## 2022-11-22 DIAGNOSIS — D649 Anemia, unspecified: Secondary | ICD-10-CM

## 2022-11-22 DIAGNOSIS — I739 Peripheral vascular disease, unspecified: Secondary | ICD-10-CM | POA: Diagnosis not present

## 2022-11-22 DIAGNOSIS — R0602 Shortness of breath: Secondary | ICD-10-CM | POA: Diagnosis not present

## 2022-11-22 DIAGNOSIS — R5383 Other fatigue: Secondary | ICD-10-CM | POA: Diagnosis not present

## 2022-11-22 DIAGNOSIS — I10 Essential (primary) hypertension: Secondary | ICD-10-CM

## 2022-11-22 DIAGNOSIS — R7989 Other specified abnormal findings of blood chemistry: Secondary | ICD-10-CM | POA: Diagnosis not present

## 2022-11-22 MED ORDER — FLUTICASONE PROPIONATE 50 MCG/ACT NA SUSP: 1.0000 | Freq: Every day | NASAL | 2 refills | Status: DC

## 2022-11-22 MED ORDER — FLUTICASONE PROPIONATE 50 MCG/ACT NA SUSP: 1.0000 | NASAL | 2 refills | Status: DC | PRN

## 2022-11-22 NOTE — Progress Notes (Signed)
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 It was 145/92 at home, fluctuates with taking his medication. Recheck In office 178/88 -taking hydralazine BID (morning and after lunch) -will have pt bring meds next visit to review, unclear if patient is taking all meds as prescribed. BP in office has been an ongoing problem, some white coat syndrome. Pt also easily agitated having to come into office which drives this up further -still smoking a little less than 1/2 ppd -due for labs and CT chest screening  Current Medication: Outpatient Encounter Medications as of 11/22/2022  Medication Sig   albuterol (VENTOLIN HFA) 108 (90 Base) MCG/ACT inhaler INHALE 1 PUFF INTO THE LUNGS EVERY 6 HOURS AS NEEDED FOR WHEEZING OR SHORTNESS OF BREATH (Patient taking differently: Inhale 1 puff into the lungs every 6 (six) hours as needed for wheezing or shortness of breath.)   Alcohol Swabs PADS Use with cleaning area to test blood sugar twice a day   allopurinol (ZYLOPRIM) 100 MG tablet Take 1 tablet (100 mg total) by mouth daily.   amLODipine (NORVASC) 5 MG tablet TAKE 1 TABLET(5 MG) BY MOUTH DAILY   Blood Glucose Calibration (TRUE METRIX LEVEL 1) Low SOLN Use as directed to check blood sugar twice a day TRUE METRIX LEVEL 1 CTRL SOLN   escitalopram (LEXAPRO) 10 MG tablet TAKE 1 TABLET BY MOUTH EVERY DAY   hydrALAZINE (APRESOLINE) 100 MG tablet Take 1 tablet (100 mg total) by mouth 3 (three) times daily.   levofloxacin (LEVAQUIN) 500 MG tablet Take 1 tablet (500 mg total) by mouth daily.   losartan-hydrochlorothiazide (HYZAAR) 100-25 MG  tablet TAKE 1 TABLET BY MOUTH DAILY   meloxicam (MOBIC) 7.5 MG tablet Take 1 tablet (7.5 mg total) by mouth daily. As needed.   omeprazole (PRILOSEC) 40 MG capsule Take 40 mg by mouth daily.   rosuvastatin (CRESTOR) 5 MG tablet TAKE 1 TABLET(5 MG) BY MOUTH DAILY   spironolactone (ALDACTONE) 25 MG tablet TAKE 1 TABLET(25 MG) BY MOUTH DAILY   TRUEplus Lancets 30G MISC Use to test blood sugar twice a day   ZENPEP 10000-32000 units CPEP Take by mouth.   [DISCONTINUED] fluticasone (FLONASE) 50 MCG/ACT nasal spray Place 1 spray into both nostrils as needed.   fluticasone (FLONASE) 50 MCG/ACT nasal spray Place 1 spray into both nostrils daily.   [DISCONTINUED] fluticasone (FLONASE) 50 MCG/ACT nasal spray Place 1 spray into both nostrils as needed.   No facility-administered encounter medications on file as of 11/22/2022.    Surgical History: Past Surgical History:  Procedure Laterality Date   CAROTID STENT  2010   Vascular bypass graft      Medical History: Past Medical History:  Diagnosis Date   Diabetes mellitus (HCC) 05/22/2015   Gout 05/22/2015   Hypertension     Family History: Family History  Problem Relation Age of Onset   Arthritis Mother     Social History   Socioeconomic History   Marital status: Married    Spouse name: Not on file   Number of children: Not on file   Years of education: Not  on file   Highest education level: Not on file  Occupational History   Not on file  Tobacco Use   Smoking status: Every Day    Current packs/day: 1.00    Average packs/day: 1 pack/day for 55.0 years (55.0 ttl pk-yrs)    Types: Cigarettes   Smokeless tobacco: Never   Tobacco comments:    1 pack daily  Substance and Sexual Activity   Alcohol use: Yes    Alcohol/week: 18.0 standard drinks of alcohol    Types: 18 Cans of beer per week   Drug use: No   Sexual activity: Not on file  Other Topics Concern   Not on file  Social History Narrative   Not on file   Social  Determinants of Health   Financial Resource Strain: Not on file  Food Insecurity: No Food Insecurity (05/05/2022)   Hunger Vital Sign    Worried About Running Out of Food in the Last Year: Never true    Ran Out of Food in the Last Year: Never true  Transportation Needs: No Transportation Needs (05/05/2022)   PRAPARE - Administrator, Civil Service (Medical): No    Lack of Transportation (Non-Medical): No  Physical Activity: Not on file  Stress: Not on file  Social Connections: Not on file  Intimate Partner Violence: Not At Risk (05/05/2022)   Humiliation, Afraid, Rape, and Kick questionnaire    Fear of Current or Ex-Partner: No    Emotionally Abused: No    Physically Abused: No    Sexually Abused: No      Review of Systems  Constitutional:  Negative for chills, fatigue and unexpected weight change.  HENT:  Negative for congestion, postnasal drip, rhinorrhea, sneezing and sore throat.   Eyes:  Negative for redness.  Respiratory:  Negative for cough, chest tightness and shortness of breath.   Cardiovascular:  Negative for chest pain and palpitations.  Gastrointestinal:  Negative for abdominal pain, constipation, diarrhea, nausea and vomiting.  Genitourinary:  Negative for dysuria and frequency.  Musculoskeletal:  Negative for arthralgias, back pain, joint swelling and neck pain.  Skin:  Negative for rash.  Neurological: Negative.  Negative for tremors and numbness.  Hematological:  Negative for adenopathy. Does not bruise/bleed easily.  Psychiatric/Behavioral:  Negative for behavioral problems (Depression), sleep disturbance and suicidal ideas. The patient is nervous/anxious.     Vital Signs: BP (!) 178/88 Comment: 190/95  Pulse 93   Temp 98.4 F (36.9 C)   Resp 16   Ht 5\' 7"  (1.702 m)   Wt 159 lb 6.4 oz (72.3 kg)   SpO2 99%   BMI 24.97 kg/m    Physical Exam Vitals and nursing note reviewed.  Constitutional:      General: He is not in acute distress.     Appearance: He is well-developed and normal weight. He is not diaphoretic.  HENT:     Head: Normocephalic and atraumatic.     Mouth/Throat:     Pharynx: No oropharyngeal exudate.  Eyes:     Pupils: Pupils are equal, round, and reactive to light.  Neck:     Thyroid: No thyromegaly.     Vascular: No JVD.     Trachea: No tracheal deviation.  Cardiovascular:     Rate and Rhythm: Normal rate and regular rhythm.     Heart sounds: Normal heart sounds. No murmur heard.    No friction rub. No gallop.  Pulmonary:     Effort: Pulmonary effort is normal.  No respiratory distress.     Breath sounds: No wheezing or rales.  Chest:     Chest wall: No tenderness.  Abdominal:     General: Bowel sounds are normal.     Palpations: Abdomen is soft.  Musculoskeletal:        General: Normal range of motion.     Cervical back: Normal range of motion and neck supple.  Lymphadenopathy:     Cervical: No cervical adenopathy.  Skin:    General: Skin is warm and dry.  Neurological:     Mental Status: He is alert and oriented to person, place, and time.     Cranial Nerves: No cranial nerve deficit.  Psychiatric:        Thought Content: Thought content normal.        Judgment: Judgment normal.        Assessment/Plan: 1. White coat syndrome with diagnosis of hypertension Always more elevated in office despite allowing pt to try to relax, better controlled at home and will continue to monitor. Pt to bring all meds next visit to review for medication adherence and instructions  2. Peripheral vascular disease, unspecified (HCC) Followed by vascular  3. Abnormal thyroid blood test - TSH + free T4  4. Low hemoglobin - CBC w/Diff/Platelet  5. Mixed hyperlipidemia - Lipid Panel With LDL/HDL Ratio  6. Cigarette nicotine dependence with other nicotine-induced disorder - CT CHEST LUNG CA SCREEN LOW DOSE W/O CM; Future  7. Shortness of breath - CT CHEST LUNG CA SCREEN LOW DOSE W/O CM; Future  8.  Other fatigue - CBC w/Diff/Platelet - Comprehensive metabolic panel - Lipid Panel With LDL/HDL Ratio - TSH + free T4   General Counseling: Bonny verbalizes understanding of the findings of todays visit and agrees with plan of treatment. I have discussed any further diagnostic evaluation that may be needed or ordered today. We also reviewed his medications today. he has been encouraged to call the office with any questions or concerns that should arise related to todays visit.    Orders Placed This Encounter  Procedures   CT CHEST LUNG CA SCREEN LOW DOSE W/O CM   CBC w/Diff/Platelet   Comprehensive metabolic panel   Lipid Panel With LDL/HDL Ratio   TSH + free T4    Meds ordered this encounter  Medications   DISCONTD: fluticasone (FLONASE) 50 MCG/ACT nasal spray    Sig: Place 1 spray into both nostrils as needed.    Dispense:  16 g    Refill:  2   fluticasone (FLONASE) 50 MCG/ACT nasal spray    Sig: Place 1 spray into both nostrils daily.    Dispense:  16 g    Refill:  2    This patient was seen by Lynn Ito, PA-C in collaboration with Dr. Beverely Risen as a part of collaborative care agreement.   Total time spent:30 Minutes Time spent includes review of chart, medications, test results, and follow up plan with the patient.      Dr Lyndon Code Internal medicine

## 2022-12-01 ENCOUNTER — Ambulatory Visit: Admission: RE | Admit: 2022-12-01 | Payer: Medicare HMO | Source: Ambulatory Visit

## 2022-12-01 DIAGNOSIS — F17210 Nicotine dependence, cigarettes, uncomplicated: Secondary | ICD-10-CM | POA: Diagnosis not present

## 2022-12-01 DIAGNOSIS — R0602 Shortness of breath: Secondary | ICD-10-CM

## 2022-12-01 DIAGNOSIS — F17218 Nicotine dependence, cigarettes, with other nicotine-induced disorders: Secondary | ICD-10-CM

## 2022-12-09 ENCOUNTER — Ambulatory Visit: Payer: Medicare HMO | Admitting: Physician Assistant

## 2022-12-09 ENCOUNTER — Telehealth: Payer: Self-pay | Admitting: Physician Assistant

## 2022-12-09 NOTE — Telephone Encounter (Signed)
Ct results are not in, pt stated to call him if there is something wrong then he will sch appt

## 2022-12-13 ENCOUNTER — Other Ambulatory Visit: Payer: Medicare HMO

## 2022-12-14 ENCOUNTER — Telehealth: Payer: Self-pay

## 2022-12-14 ENCOUNTER — Other Ambulatory Visit: Payer: Self-pay | Admitting: Physician Assistant

## 2022-12-14 DIAGNOSIS — I10 Essential (primary) hypertension: Secondary | ICD-10-CM

## 2022-12-14 DIAGNOSIS — I700 Atherosclerosis of aorta: Secondary | ICD-10-CM

## 2022-12-14 DIAGNOSIS — I2510 Atherosclerotic heart disease of native coronary artery without angina pectoris: Secondary | ICD-10-CM

## 2022-12-14 NOTE — Telephone Encounter (Signed)
Spoke with patient regarding chest CT.

## 2022-12-14 NOTE — Telephone Encounter (Signed)
-----   Message from Carlean Jews sent at 12/10/2022  1:25 PM EDT ----- Please let him know that his CT continues to show stable nodules of benign appearance, chronic calcific pancreatitis as previously known, as well as Emphysema, aortic and coronary artery atherosclerosis. The extent of involvement in arteries cannot be specified and would recommend a cardiology referral to evaluate further if he is open to this. They could also address BP further as well.

## 2022-12-15 ENCOUNTER — Telehealth: Payer: Self-pay | Admitting: Physician Assistant

## 2022-12-15 NOTE — Telephone Encounter (Signed)
Cardiology  referral sent via Epic to Dr. Adrian Blackwater with [facility]. Sent secure chat to Kathie Rhodes to make sure she received it. Notified patient. Gave pt telephone # [phone]

## 2023-01-29 ENCOUNTER — Other Ambulatory Visit: Payer: Self-pay | Admitting: Physician Assistant

## 2023-01-29 DIAGNOSIS — F411 Generalized anxiety disorder: Secondary | ICD-10-CM

## 2023-01-29 DIAGNOSIS — I700 Atherosclerosis of aorta: Secondary | ICD-10-CM

## 2023-01-29 DIAGNOSIS — I10 Essential (primary) hypertension: Secondary | ICD-10-CM

## 2023-03-17 ENCOUNTER — Other Ambulatory Visit: Payer: Self-pay

## 2023-04-28 ENCOUNTER — Encounter: Payer: Self-pay | Admitting: Physician Assistant

## 2023-04-28 ENCOUNTER — Ambulatory Visit (INDEPENDENT_AMBULATORY_CARE_PROVIDER_SITE_OTHER): Payer: Medicare HMO | Admitting: Physician Assistant

## 2023-04-28 VITALS — BP 138/90 | HR 72 | Temp 98.0°F | Resp 16 | Ht 67.0 in | Wt 165.4 lb

## 2023-04-28 DIAGNOSIS — Z1212 Encounter for screening for malignant neoplasm of rectum: Secondary | ICD-10-CM | POA: Diagnosis not present

## 2023-04-28 DIAGNOSIS — I2510 Atherosclerotic heart disease of native coronary artery without angina pectoris: Secondary | ICD-10-CM

## 2023-04-28 DIAGNOSIS — E1159 Type 2 diabetes mellitus with other circulatory complications: Secondary | ICD-10-CM

## 2023-04-28 DIAGNOSIS — Z1211 Encounter for screening for malignant neoplasm of colon: Secondary | ICD-10-CM | POA: Diagnosis not present

## 2023-04-28 DIAGNOSIS — I10 Essential (primary) hypertension: Secondary | ICD-10-CM | POA: Diagnosis not present

## 2023-04-28 DIAGNOSIS — Z0000 Encounter for general adult medical examination without abnormal findings: Secondary | ICD-10-CM | POA: Diagnosis not present

## 2023-04-28 DIAGNOSIS — J0100 Acute maxillary sinusitis, unspecified: Secondary | ICD-10-CM

## 2023-04-28 LAB — POCT GLYCOSYLATED HEMOGLOBIN (HGB A1C): Hemoglobin A1C: 6.8 % — AB (ref 4.0–5.6)

## 2023-04-28 MED ORDER — AZITHROMYCIN 250 MG PO TABS: ORAL_TABLET | ORAL | 0 refills | Status: AC

## 2023-04-28 NOTE — Progress Notes (Signed)
[facility] [address] [place], [place] [address]  Internal MEDICINE  Office Visit Note  Patient Name: Donald Scott  [id number]  [id number]  Date of Service: 05/04/2023  Chief Complaint  Patient presents with   Medicare Wellness   Diabetes   Hypertension    HPI Traevon presents for an annual well visit Well-appearing 73 y.o. male Routine CRC screening: Due for colon screening, wants to repeat cologuard Eye exam and/or foot exam: eye exam done in Aug Labs: ordered previously and will have this done now Other concerns: never heard from cardiology, misplaced # to call them and will check on this -States he decided to go 3 weeks without any meds, then started back taking again now and seems to be working better now. BP much better controlled -congestion for the last 2 weeks, OTC not helping. Sinus draining, no coughing. No pressure. Given mucinex samples     04/28/2023   11:09 AM 04/22/2022   11:07 AM 04/17/2021   10:10 AM  MMSE - Mini Mental State Exam  Orientation to time 5 5 5   Orientation to Place 5 5 5   Registration 3 3 3   Attention/ Calculation 5 5 5   Recall 3 3 3   Language- name 2 objects 2 2 2   Language- repeat 1 1 1   Language- follow 3 step command 3 3 3   Language- read & follow direction 1 1 1   Write a sentence 1 1 1   Copy design 1 1 1   Total score 30 30 30     Functional Status Survey: Is the patient deaf or have difficulty hearing?: No Does the patient have difficulty seeing, even when wearing glasses/contacts?: No Does the patient have difficulty concentrating, remembering, or making decisions?: No Does the patient have difficulty walking or climbing stairs?: No Does the patient have difficulty dressing or bathing?: No Does the patient have difficulty doing errands alone such as visiting a doctor's office or shopping?: No     01/25/2022   10:27 AM 04/22/2022   11:06 AM 07/22/2022   10:16 AM 11/22/2022    2:01 PM 04/28/2023   11:08 AM   Fall Risk  Falls in the past year? 0 0 0 0 0  Patient at Risk for Falls Due to   No Fall Risks    Fall risk Follow up   Falls evaluation completed         04/28/2023   11:09 AM  Depression screen PHQ 2/9  Decreased Interest 0  Down, Depressed, Hopeless 0  PHQ - 2 Score 0        No data to display            Current Medication: Outpatient Encounter Medications as of 04/28/2023  Medication Sig   albuterol (VENTOLIN HFA) 108 (90 Base) MCG/ACT inhaler INHALE 1 PUFF INTO THE LUNGS EVERY 6 HOURS AS NEEDED FOR WHEEZING OR SHORTNESS OF BREATH (Patient taking differently: Inhale 1 puff into the lungs every 6 (six) hours as needed for wheezing or shortness of breath.)   Alcohol Swabs PADS Use with cleaning area to test blood sugar twice a day   allopurinol (ZYLOPRIM) 100 MG tablet Take 1 tablet (100 mg total) by mouth daily.   amLODipine (NORVASC) 5 MG tablet TAKE 1 TABLET(5 MG) BY MOUTH DAILY   [EXPIRED] azithromycin (ZITHROMAX) 250 MG tablet Take 2 tablets on day 1, then 1 tablet daily on days 2 through 5   Blood Glucose Calibration (TRUE METRIX LEVEL 1) Low SOLN Use  as directed to check blood sugar twice a day TRUE METRIX LEVEL 1 CTRL SOLN   escitalopram (LEXAPRO) 10 MG tablet TAKE 1 TABLET(10 MG) BY MOUTH DAILY   fluticasone (FLONASE) 50 MCG/ACT nasal spray Place 1 spray into both nostrils daily.   hydrALAZINE (APRESOLINE) 100 MG tablet Take 1 tablet (100 mg total) by mouth 3 (three) times daily.   losartan-hydrochlorothiazide (HYZAAR) 100-25 MG tablet TAKE 1 TABLET BY MOUTH DAILY   meloxicam (MOBIC) 7.5 MG tablet Take 1 tablet (7.5 mg total) by mouth daily. As needed.   omeprazole (PRILOSEC) 40 MG capsule Take 40 mg by mouth daily.   rosuvastatin (CRESTOR) 5 MG tablet TAKE 1 TABLET(5 MG) BY MOUTH DAILY   spironolactone (ALDACTONE) 25 MG tablet TAKE 1 TABLET(25 MG) BY MOUTH DAILY   TRUEplus Lancets 30G MISC Use to test blood sugar twice a day   ZENPEP 10000-32000 units CPEP Take  by mouth.   [DISCONTINUED] levofloxacin (LEVAQUIN) 500 MG tablet Take 1 tablet (500 mg total) by mouth daily.   No facility-administered encounter medications on file as of 04/28/2023.    Surgical History: Past Surgical History:  Procedure Laterality Date   CAROTID STENT  2010   Vascular bypass graft      Medical History: Past Medical History:  Diagnosis Date   Diabetes mellitus (HCC) 05/22/2015   Gout 05/22/2015   Hypertension     Family History: Family History  Problem Relation Age of Onset   Arthritis Mother     Social History   Socioeconomic History   Marital status: Married    Spouse name: Not on file   Number of children: Not on file   Years of education: Not on file   Highest education level: Not on file  Occupational History   Not on file  Tobacco Use   Smoking status: Every Day    Current packs/day: 1.00    Average packs/day: 1 pack/day for 55.0 years (55.0 ttl pk-yrs)    Types: Cigarettes   Smokeless tobacco: Never   Tobacco comments:    1 pack daily  Substance and Sexual Activity   Alcohol use: Yes    Alcohol/week: 18.0 standard drinks of alcohol    Types: 18 Cans of beer per week   Drug use: No   Sexual activity: Not on file  Other Topics Concern   Not on file  Social History Narrative   Not on file   Social Drivers of Health   Financial Resource Strain: Not on file  Food Insecurity: No Food Insecurity (05/05/2022)   Hunger Vital Sign    Worried About Running Out of Food in the Last Year: Never true    Ran Out of Food in the Last Year: Never true  Transportation Needs: No Transportation Needs (05/05/2022)   PRAPARE - Administrator, Civil Service (Medical): No    Lack of Transportation (Non-Medical): No  Physical Activity: Not on file  Stress: Not on file  Social Connections: Not on file  Intimate Partner Violence: Not At Risk (05/05/2022)   Humiliation, Afraid, Rape, and Kick questionnaire    Fear of Current or Ex-Partner: No     Emotionally Abused: No    Physically Abused: No    Sexually Abused: No      Review of Systems  Constitutional:  Negative for chills, fatigue and unexpected weight change.  HENT:  Negative for congestion, postnasal drip, rhinorrhea, sneezing and sore throat.   Eyes:  Negative for redness.  Respiratory:  Negative for cough, chest tightness and shortness of breath.   Cardiovascular:  Negative for chest pain and palpitations.  Gastrointestinal:  Negative for abdominal pain, constipation, diarrhea, nausea and vomiting.  Genitourinary:  Negative for dysuria and frequency.  Musculoskeletal:  Negative for arthralgias, back pain, joint swelling and neck pain.  Skin:  Negative for rash.  Neurological: Negative.  Negative for tremors and numbness.  Hematological:  Negative for adenopathy. Does not bruise/bleed easily.  Psychiatric/Behavioral:  Negative for behavioral problems (Depression), sleep disturbance and suicidal ideas. The patient is nervous/anxious.     Vital Signs: BP (!) 138/90   Pulse 72   Temp 98 F (36.7 C)   Resp 16   Ht 5\' 7"  (1.702 m)   Wt 165 lb 6.4 oz (75 kg)   SpO2 99%   BMI 25.91 kg/m    Physical Exam Vitals and nursing note reviewed.  Constitutional:      Appearance: Normal appearance.  HENT:     Head: Normocephalic and atraumatic.  Eyes:     Extraocular Movements: Extraocular movements intact.  Cardiovascular:     Rate and Rhythm: Normal rate and regular rhythm.  Pulmonary:     Effort: Pulmonary effort is normal.     Breath sounds: Normal breath sounds.  Neurological:     Mental Status: He is alert.  Psychiatric:        Mood and Affect: Mood normal.        Behavior: Behavior normal.        Assessment/Plan: 1. Encounter for Medicare annual wellness exam (Primary) AWV performed, labs previously ordered, due for colon screening  2. Type 2 diabetes mellitus with other circulatory complication, without long-term current use of insulin (HCC) -  POCT HgB A1C is 6.8 which is up from 6.5 last visit. Will work on diet and exercise and continue to monitor  3. White coat syndrome with diagnosis of hypertension Improved from previously, better controlled at home. Continue current medications. Will also be establishing with cardiology  4. Screening for colorectal cancer - Cologuard  5. Acute non-recurrent maxillary sinusitis - azithromycin (ZITHROMAX) 250 MG tablet; Take 2 tablets on day 1, then 1 tablet daily on days 2 through 5  Dispense: 6 tablet; Refill: 0  6. Atherosclerosis of native coronary artery of native heart without angina pectoris Will be establishing with cardiology, continue crestor     General Counseling: Jaray verbalizes understanding of the findings of todays visit and agrees with plan of treatment. I have discussed any further diagnostic evaluation that may be needed or ordered today. We also reviewed his medications today. he has been encouraged to call the office with any questions or concerns that should arise related to todays visit.    Orders Placed This Encounter  Procedures   Cologuard   POCT HgB A1C    Meds ordered this encounter  Medications   azithromycin (ZITHROMAX) 250 MG tablet    Sig: Take 2 tablets on day 1, then 1 tablet daily on days 2 through 5    Dispense:  6 tablet    Refill:  0    Return in about 4 months (around 08/26/2023) for A1c, reprint lab slip from Aug, also needs med list printed, need cardiology ref #--he lost it.   Total time spent:35 Minutes Time spent includes review of chart, medications, test results, and follow up plan with the patient.   [place] Controlled Substance Database was reviewed by me.  This patient was seen by Lynn Ito, PA-C  in collaboration with Dr. Beverely Risen as a part of collaborative care agreement.  Lynn Ito, PA-C Internal medicine

## 2023-06-03 DIAGNOSIS — Z1212 Encounter for screening for malignant neoplasm of rectum: Secondary | ICD-10-CM | POA: Diagnosis not present

## 2023-06-03 DIAGNOSIS — Z1211 Encounter for screening for malignant neoplasm of colon: Secondary | ICD-10-CM | POA: Diagnosis not present

## 2023-06-06 LAB — COLOGUARD: COLOGUARD: NEGATIVE

## 2023-06-07 ENCOUNTER — Other Ambulatory Visit: Payer: Self-pay | Admitting: Physician Assistant

## 2023-06-08 ENCOUNTER — Telehealth: Payer: Self-pay

## 2023-06-08 NOTE — Telephone Encounter (Signed)
 Pt was notified that cologuard is negative

## 2023-06-08 NOTE — Telephone Encounter (Signed)
-----   Message from Carlean Jews sent at 06/08/2023 10:32 AM EST ----- Please let him know that his cologuard was negative

## 2023-07-12 ENCOUNTER — Telehealth: Payer: Self-pay | Admitting: Physician Assistant

## 2023-07-12 ENCOUNTER — Emergency Department
Admission: EM | Admit: 2023-07-12 | Discharge: 2023-07-12 | Disposition: A | Discharge status: Home / Self Care | Attending: Emergency Medicine | Admitting: Emergency Medicine

## 2023-07-12 ENCOUNTER — Emergency Department

## 2023-07-12 DIAGNOSIS — I10 Essential (primary) hypertension: Secondary | ICD-10-CM | POA: Diagnosis not present

## 2023-07-12 DIAGNOSIS — X509XXA Other and unspecified overexertion or strenuous movements or postures, initial encounter: Secondary | ICD-10-CM | POA: Diagnosis not present

## 2023-07-12 DIAGNOSIS — S86011A Strain of right Achilles tendon, initial encounter: Secondary | ICD-10-CM | POA: Insufficient documentation

## 2023-07-12 DIAGNOSIS — S86091A Other specified injury of right Achilles tendon, initial encounter: Secondary | ICD-10-CM | POA: Diagnosis not present

## 2023-07-12 DIAGNOSIS — E119 Type 2 diabetes mellitus without complications: Secondary | ICD-10-CM | POA: Diagnosis not present

## 2023-07-12 DIAGNOSIS — S8991XA Unspecified injury of right lower leg, initial encounter: Secondary | ICD-10-CM | POA: Diagnosis present

## 2023-07-12 DIAGNOSIS — S86001A Unspecified injury of right Achilles tendon, initial encounter: Secondary | ICD-10-CM | POA: Diagnosis not present

## 2023-07-12 DIAGNOSIS — M79661 Pain in right lower leg: Secondary | ICD-10-CM | POA: Diagnosis not present

## 2023-07-12 MED ORDER — AMLODIPINE BESYLATE 5 MG PO TABS
5.0000 mg | ORAL_TABLET | Freq: Once | ORAL | Status: AC
  Administered 2023-07-12: 5 mg via ORAL
  Filled 2023-07-12: qty 1

## 2023-07-12 NOTE — ED Triage Notes (Signed)
 Pt presents to the ED via POV from home. Pt reports that he was helping move a box spring with his son on Saturday and felt a pop in his right calf. Pain and swelling since. Pt able to ambulate. Pt reports hx of htn, but states that he has not taken his BP medication today.   BP 226/98 at time of triage. Pt states "that's about standard for me"

## 2023-07-12 NOTE — Discharge Instructions (Signed)
 Follow up with orthopedics, call for an appointment Wear the boot at all times Ice to the right ankle Return if worsening

## 2023-07-12 NOTE — Telephone Encounter (Signed)
 Contacted patient to schedule ED follow up. Patient has visit with [facility] orthopedics-Toni

## 2023-07-12 NOTE — ED Provider Notes (Signed)
 [facility] Provider Note    Event Date/Time   First MD Initiated Contact with Patient 07/12/23 1151     (approximate)   History   Leg Injury   HPI  Donald Scott is a 73 y.o. male history of diabetes, hypertension, gout presents emergency department with right leg pain.  Patient was helping his son move a box spring when he felt a large pop in the posterior right lower leg.  States now its painful to bear weight and to point his toes.  Thinks he hurt his Achilles.  This happened on Saturday.  Patient is continue to walk on the injury.  He also did not take his blood pressure medicine prior to arrival and is unsure what his medicine is.      Physical Exam   Triage Vital Signs: ED Triage Vitals  Encounter Vitals Group     BP 07/12/23 1125 (!) 226/98     Systolic BP Percentile --      Diastolic BP Percentile --      Pulse Rate 07/12/23 1125 72     Resp 07/12/23 1125 18     Temp 07/12/23 1125 98.4 F (36.9 C)     Temp Source 07/12/23 1125 Oral     SpO2 07/12/23 1125 100 %     Weight 07/12/23 1127 160 lb (72.6 kg)     Height 07/12/23 1127 5\' 7"  (1.702 m)     Head Circumference --      Peak Flow --      Pain Score 07/12/23 1127 9     Pain Loc --      Pain Education --      Exclude from Growth Chart --     Most recent vital signs: Vitals:   07/12/23 1125 07/12/23 1224  BP: (!) 226/98 (!) 186/105  Pulse: 72 74  Resp: 18 20  Temp: 98.4 F (36.9 C)   SpO2: 100% 100%     General: Awake, no distress.   CV:  Good peripheral perfusion. regular rate and  rhythm Resp:  Normal effort.  Abd:  No distention.   Other:  Right posterior leg tender at Achilles, swelling noted around the ankle bilaterally and into the mid calf, patient can point toes.  Neurovascular intact   ED Results / Procedures / Treatments   Labs (all labs ordered are listed, but only abnormal results are displayed) Labs Reviewed - No data to  display   EKG     RADIOLOGY X-ray of the right tib-fib    PROCEDURES:   Procedures Chief Complaint  Patient presents with   Leg Injury      MEDICATIONS ORDERED IN ED: Medications  amLODipine (NORVASC) tablet 5 mg (5 mg Oral Given 07/12/23 1223)     IMPRESSION / MDM / ASSESSMENT AND PLAN / ED COURSE  I reviewed the triage vital signs and the nursing notes.                              Differential diagnosis includes, but is not limited to, Achilles rupture, Achilles tear, sprained ankle  Patient's presentation is most consistent with acute illness / injury with system symptoms.   X-ray of the right tib-fib  Patient was given Norvasc 5 mg.  In review of his past medical charts looks like he does take Norvasc.  X-ray of the right tib-fib independently reviewed interpreted by me as having a  fracture.  Did explain findings patient.  Feels more of a Achilles tendon tear probably most likely partial tear.  Placed him in a long cam boot.  He is to follow-up with orthopedics.  Tylenol or ibuprofen for pain as needed.  Apply ice.  He is in agreement treatment plan.  He is to continue to take his blood pressure medication.  Discharged in stable condition.      FINAL CLINICAL IMPRESSION(S) / ED DIAGNOSES   Final diagnoses:  Achilles tendon injury, right, initial encounter     Rx / DC Orders   ED Discharge Orders     None        Note:  This document was prepared using Dragon voice recognition software and may include unintentional dictation errors.    Faythe Ghee, PA-C 07/12/23 1308    Chesley Noon, MD 07/12/23 [phone]

## 2023-07-26 DIAGNOSIS — Z5941 Food insecurity: Secondary | ICD-10-CM | POA: Diagnosis not present

## 2023-07-26 DIAGNOSIS — Z5986 Financial insecurity: Secondary | ICD-10-CM | POA: Diagnosis not present

## 2023-07-26 DIAGNOSIS — E1121 Type 2 diabetes mellitus with diabetic nephropathy: Secondary | ICD-10-CM | POA: Diagnosis not present

## 2023-07-30 ENCOUNTER — Other Ambulatory Visit: Payer: Self-pay | Admitting: Physician Assistant

## 2023-07-30 DIAGNOSIS — I700 Atherosclerosis of aorta: Secondary | ICD-10-CM

## 2023-07-30 DIAGNOSIS — I10 Essential (primary) hypertension: Secondary | ICD-10-CM

## 2023-07-31 ENCOUNTER — Other Ambulatory Visit: Payer: Self-pay | Admitting: Physician Assistant

## 2023-07-31 DIAGNOSIS — I10 Essential (primary) hypertension: Secondary | ICD-10-CM

## 2023-09-01 ENCOUNTER — Encounter: Payer: Self-pay | Admitting: Physician Assistant

## 2023-09-01 ENCOUNTER — Ambulatory Visit (INDEPENDENT_AMBULATORY_CARE_PROVIDER_SITE_OTHER): Payer: Medicare HMO | Admitting: Physician Assistant

## 2023-09-01 VITALS — BP 135/80 | HR 71 | Temp 98.3°F | Resp 16 | Ht 67.0 in | Wt 151.0 lb

## 2023-09-01 DIAGNOSIS — E1159 Type 2 diabetes mellitus with other circulatory complications: Secondary | ICD-10-CM | POA: Diagnosis not present

## 2023-09-01 DIAGNOSIS — R5383 Other fatigue: Secondary | ICD-10-CM

## 2023-09-01 DIAGNOSIS — M1A9XX0 Chronic gout, unspecified, without tophus (tophi): Secondary | ICD-10-CM | POA: Diagnosis not present

## 2023-09-01 DIAGNOSIS — F17218 Nicotine dependence, cigarettes, with other nicotine-induced disorders: Secondary | ICD-10-CM | POA: Diagnosis not present

## 2023-09-01 DIAGNOSIS — R7989 Other specified abnormal findings of blood chemistry: Secondary | ICD-10-CM | POA: Diagnosis not present

## 2023-09-01 DIAGNOSIS — I10 Essential (primary) hypertension: Secondary | ICD-10-CM

## 2023-09-01 DIAGNOSIS — E782 Mixed hyperlipidemia: Secondary | ICD-10-CM | POA: Diagnosis not present

## 2023-09-01 LAB — POCT GLYCOSYLATED HEMOGLOBIN (HGB A1C): Hemoglobin A1C: 6.9 % — AB (ref 4.0–5.6)

## 2023-09-01 MED ORDER — ALLOPURINOL 100 MG PO TABS: 100.0000 mg | ORAL_TABLET | Freq: Every day | ORAL | 1 refills | Status: AC

## 2023-09-01 NOTE — Progress Notes (Signed)
 [facility] [address] [place], [place] [address]  Internal MEDICINE  Office Visit Note  Patient Name: Donald Scott  [id number]  [id number]  Date of Service: 09/01/2023  Chief Complaint  Patient presents with   Follow-up   Diabetes   Hypertension   Medication Refill   Quality Metric Gaps   Tendonitis    Right foot, torn achilles    HPI Pt is here for routine follow up -BP stable, did not smoke cig before coming in and thinks this helps BP reading in office -Smoking 1/2ppd, not ready to quit -due for lung screening ct in August and will order -right achilles injury last month and went to ED where xray done. He was placed in CAM boot and sent to ortho. He saw podiatry on 07/26/23 and was told no achilles tear based on exam findings, but to stay in boot a few more weeks. He is supposed to follow up with podiatry as needed. He states it is still painful and may swell some and was advised to call their office for follow up.  -Thinks he may have had gout flare in knee for 1-2 days recently. States this resolved. He has not taking his allopurinol  in awhile and would like to restart -never got labs done, will reorder  Current Medication: Outpatient Encounter Medications as of 09/01/2023  Medication Sig   albuterol  (VENTOLIN  HFA) 108 (90 Base) MCG/ACT inhaler INHALE 1 PUFF INTO THE LUNGS EVERY 6 HOURS AS NEEDED FOR WHEEZING OR SHORTNESS OF BREATH (Patient taking differently: Inhale 1 puff into the lungs every 6 (six) hours as needed for wheezing or shortness of breath.)   Alcohol  Swabs  PADS Use with cleaning area to test blood sugar twice a day   amLODipine  (NORVASC ) 5 MG tablet TAKE 1 TABLET(5 MG) BY MOUTH DAILY   Blood Glucose Calibration (TRUE METRIX LEVEL 1) Low SOLN Use as directed to check blood sugar twice a day TRUE METRIX LEVEL 1 CTRL SOLN   escitalopram  (LEXAPRO ) 10 MG tablet TAKE 1 TABLET(10 MG) BY MOUTH DAILY   fluticasone  (FLONASE ) 50 MCG/ACT nasal spray  INSTILL 1 SPRAY IN EACH NOSTRIL EVERY DAY AS NEEDED   hydrALAZINE  (APRESOLINE ) 100 MG tablet Take 1 tablet (100 mg total) by mouth 3 (three) times daily.   losartan -hydrochlorothiazide  (HYZAAR) 100-25 MG tablet TAKE 1 TABLET BY MOUTH DAILY   meloxicam  (MOBIC ) 7.5 MG tablet Take 1 tablet (7.5 mg total) by mouth daily. As needed.   omeprazole  (PRILOSEC) 40 MG capsule Take 40 mg by mouth daily.   rosuvastatin  (CRESTOR ) 5 MG tablet TAKE 1 TABLET(5 MG) BY MOUTH DAILY   spironolactone  (ALDACTONE ) 25 MG tablet TAKE 1 TABLET(25 MG) BY MOUTH DAILY   TRUEplus Lancets 30G MISC Use to test blood sugar twice a day   ZENPEP  10000-32000 units CPEP Take by mouth.   [DISCONTINUED] allopurinol  (ZYLOPRIM ) 100 MG tablet Take 1 tablet (100 mg total) by mouth daily.   allopurinol  (ZYLOPRIM ) 100 MG tablet Take 1 tablet (100 mg total) by mouth daily.   No facility-administered encounter medications on file as of 09/01/2023.    Surgical History: Past Surgical History:  Procedure Laterality Date   CAROTID STENT  2010   Vascular bypass graft      Medical History: Past Medical History:  Diagnosis Date   Diabetes mellitus (HCC) 05/22/2015   Gout 05/22/2015   Hypertension     Family History: Family History  Problem Relation Age of Onset   Arthritis Mother  Social History   Socioeconomic History   Marital status: Married    Spouse name: Not on file   Number of children: Not on file   Years of education: Not on file   Highest education level: Not on file  Occupational History   Not on file  Tobacco Use   Smoking status: Every Day    Current packs/day: 1.00    Average packs/day: 1 pack/day for 55.0 years (55.0 ttl pk-yrs)    Types: Cigarettes   Smokeless tobacco: Never   Tobacco comments:    1 pack daily  Substance and Sexual Activity   Alcohol  use: Yes    Alcohol /week: 18.0 standard drinks of alcohol     Types: 18 Cans of beer per week   Drug use: No   Sexual activity: Not on file  Other  Topics Concern   Not on file  Social History Narrative   Not on file   Social Drivers of Health   Financial Resource Strain: Medium Risk (07/28/2023)   Received from [facility]   Overall Financial Resource Strain (CARDIA)    Difficulty of Paying Living Expenses: Somewhat hard  Food Insecurity: Food Insecurity Present (07/28/2023)   Received from [facility]   Hunger Vital Sign    Worried About Running Out of Food in the Last Year: Sometimes true    Ran Out of Food in the Last Year: Sometimes true  Transportation Needs: No Transportation Needs (07/28/2023)   Received from [facility] - Transportation    In the past 12 months, has lack of transportation kept you from medical appointments or from getting medications?: No    Lack of Transportation (Non-Medical): No  Physical Activity: Sufficiently Active (07/28/2023)   Received from [facility]   Exercise Vital Sign    Days of Exercise per Week: 5 days    Minutes of Exercise per Session: 30 min  Stress: No Stress Concern Present (07/28/2023)   Received from [facility] Occup[facility] - Occupational Stress Questionnaire    Feeling of Stress : Not at all  Social Connections: Socially Isolated (07/28/2023)   Received from [facility]   Social Connection and Isolation Panel [NHANES]    Frequency of Communication with Friends and Family: Once a week    Frequency of Social Gatherings with Friends and Family: Never    Attends Religious Services: Never    Database administrator or Organizations: No    Attends Banker Meetings: Never    Marital Status: Married  Catering manager Violence: Not At Risk (05/05/2022)   Humiliation, Afraid, Rape, and Kick questionnaire    Fear of Current or Ex-Partner: No    Emotionally Abused: No    Physically Abused: No    Sexually Abused: No       Review of Systems  Constitutional:  Negative for chills, fatigue and unexpected weight change.  HENT:  Positive for postnasal drip. Negative for congestion, rhinorrhea, sneezing and sore throat.   Eyes:  Negative for redness.  Respiratory:  Negative for cough, chest tightness and shortness of breath.   Cardiovascular:  Negative for chest pain and palpitations.  Gastrointestinal:  Negative for abdominal pain, constipation, diarrhea, nausea and vomiting.  Genitourinary:  Negative for dysuria and frequency.  Musculoskeletal:  Positive for arthralgias. Negative for back pain, joint swelling and neck pain.  Skin:  Negative for rash.  Neurological:  Negative.  Negative for tremors and numbness.  Hematological:  Negative for adenopathy. Does not bruise/bleed easily.  Psychiatric/Behavioral:  Negative for behavioral problems (Depression), sleep disturbance and suicidal ideas. The patient is not nervous/anxious.     Vital Signs: BP 135/80   Pulse 71   Temp 98.3 F (36.8 C)   Resp 16   Ht 5\' 7"  (1.702 m)   Wt 151 lb (68.5 kg)   SpO2 99%   BMI 23.65 kg/m    Physical Exam Vitals and nursing note reviewed.  Constitutional:      General: He is not in acute distress.    Appearance: He is well-developed and normal weight. He is not diaphoretic.  HENT:     Head: Normocephalic and atraumatic.  Neck:     Thyroid : No thyromegaly.     Vascular: No JVD.     Trachea: No tracheal deviation.  Cardiovascular:     Rate and Rhythm: Normal rate and regular rhythm.     Heart sounds: Normal heart sounds. No murmur heard.    No friction rub. No gallop.  Pulmonary:     Effort: Pulmonary effort is normal. No respiratory distress.     Breath sounds: No wheezing or rales.  Chest:     Chest wall: No tenderness.  Musculoskeletal:        General: Normal range of motion.  Skin:    General: Skin is warm and dry.  Neurological:     Mental Status: He is alert and oriented to person, place, and  time.  Psychiatric:        Thought Content: Thought content normal.        Judgment: Judgment normal.        Assessment/Plan: 1. White coat syndrome with diagnosis of hypertension (Primary) Improved today, will continue current medications  2. Type 2 diabetes mellitus with other circulatory complication, without long-term current use of insulin  (HCC) - POCT HgB A1C is 6.9 which is up from 6.8 last visit and discussed improving diet and exercise - Urine Microalbumin w/creat. ratio  3. Mixed hyperlipidemia Continue crestor  and will update labs - Lipid Panel With LDL/HDL Ratio  4. Abnormal thyroid  blood test - TSH + free T4  5. Chronic gout without tophus, unspecified cause, unspecified site - allopurinol  (ZYLOPRIM ) 100 MG tablet; Take 1 tablet (100 mg total) by mouth daily.  Dispense: 90 tablet; Refill: 1  6. Cigarette nicotine dependence with other nicotine-induced disorder Will order annual CT screening for August - CT CHEST LUNG CA SCREEN LOW DOSE W/O CM; Future  7. Other fatigue - CBC w/Diff/Platelet - Comprehensive metabolic panel with GFR - TSH + free T4 - Lipid Panel With LDL/HDL Ratio   General Counseling: Satish verbalizes understanding of the findings of todays visit and agrees with plan of treatment. I have discussed any further diagnostic evaluation that may be needed or ordered today. We also reviewed his medications today. he has been encouraged to call the office with any questions or concerns that should arise related to todays visit.    Orders Placed This Encounter  Procedures   CT CHEST LUNG CA SCREEN LOW DOSE W/O CM   Urine Microalbumin w/creat. ratio   CBC w/Diff/Platelet   Comprehensive metabolic panel with GFR   TSH + free T4   Lipid Panel With LDL/HDL Ratio   POCT HgB A1C    Meds ordered this encounter  Medications   allopurinol  (ZYLOPRIM ) 100 MG tablet    Sig: Take 1 tablet (100  mg total) by mouth daily.    Dispense:  90 tablet     Refill:  1    This patient was seen by Taylor Favia, PA-C in collaboration with Dr. Verneta Gone as a part of collaborative care agreement.   Total time spent:30 Minutes Time spent includes review of chart, medications, test results, and follow up plan with the patient.      Dr Fozia M Khan Internal medicine

## 2023-09-02 LAB — MICROALBUMIN / CREATININE URINE RATIO
Creatinine, Urine: 145.1 mg/dL
Microalb/Creat Ratio: 157 mg/g{creat} — ABNORMAL HIGH (ref 0–29)
Microalbumin, Urine: 227.8 ug/mL

## 2023-10-30 ENCOUNTER — Other Ambulatory Visit: Payer: Self-pay | Admitting: Physician Assistant

## 2023-10-30 DIAGNOSIS — F411 Generalized anxiety disorder: Secondary | ICD-10-CM

## 2023-11-01 ENCOUNTER — Other Ambulatory Visit: Payer: Self-pay | Admitting: Physician Assistant

## 2023-11-30 DIAGNOSIS — E119 Type 2 diabetes mellitus without complications: Secondary | ICD-10-CM | POA: Diagnosis not present

## 2023-12-07 ENCOUNTER — Encounter: Payer: Self-pay | Admitting: Physician Assistant

## 2023-12-12 ENCOUNTER — Ambulatory Visit
Admission: RE | Admit: 2023-12-12 | Discharge: 2023-12-12 | Disposition: A | Discharge status: Home / Self Care | Source: Ambulatory Visit | Attending: Physician Assistant | Admitting: Physician Assistant

## 2023-12-12 DIAGNOSIS — F17218 Nicotine dependence, cigarettes, with other nicotine-induced disorders: Secondary | ICD-10-CM

## 2023-12-12 DIAGNOSIS — Z122 Encounter for screening for malignant neoplasm of respiratory organs: Secondary | ICD-10-CM | POA: Diagnosis not present

## 2023-12-12 DIAGNOSIS — F17210 Nicotine dependence, cigarettes, uncomplicated: Secondary | ICD-10-CM | POA: Diagnosis not present

## 2023-12-26 ENCOUNTER — Ambulatory Visit (INDEPENDENT_AMBULATORY_CARE_PROVIDER_SITE_OTHER): Admitting: Physician Assistant

## 2023-12-26 ENCOUNTER — Encounter: Payer: Self-pay | Admitting: Physician Assistant

## 2023-12-26 VITALS — BP 148/90 | HR 74 | Temp 98.5°F | Resp 16 | Ht 67.0 in | Wt 154.0 lb

## 2023-12-26 DIAGNOSIS — E1159 Type 2 diabetes mellitus with other circulatory complications: Secondary | ICD-10-CM

## 2023-12-26 DIAGNOSIS — I10 Essential (primary) hypertension: Secondary | ICD-10-CM | POA: Diagnosis not present

## 2023-12-26 DIAGNOSIS — I2510 Atherosclerotic heart disease of native coronary artery without angina pectoris: Secondary | ICD-10-CM

## 2023-12-26 DIAGNOSIS — I700 Atherosclerosis of aorta: Secondary | ICD-10-CM

## 2023-12-26 DIAGNOSIS — Z23 Encounter for immunization: Secondary | ICD-10-CM | POA: Diagnosis not present

## 2023-12-26 DIAGNOSIS — F17218 Nicotine dependence, cigarettes, with other nicotine-induced disorders: Secondary | ICD-10-CM

## 2023-12-26 LAB — POCT GLYCOSYLATED HEMOGLOBIN (HGB A1C): Hemoglobin A1C: 6.9 % — AB (ref 4.0–5.6)

## 2023-12-26 NOTE — Progress Notes (Signed)
 [facility] [address] [place], [place] [address]  Internal MEDICINE  Office Visit Note  Patient Name: Donald Scott  [id number]  [id number]  Date of Service: 12/26/2023  Chief Complaint  Patient presents with   Follow-up    Review chest CT   Diabetes   Hypertension    HPI Pt is here for routine follow up -states [facility] provider drew blood and did urine test--microalbumin ratio elevated though lower than than last tested. It was 140. Creatinine 1.1 and GFR 65. Still needs to get other labs done ordered in May. -CT chest lung cancer screening 12/12/23: IMPRESSION: 1. Lung-RADS 2, benign appearance or behavior. Continue annual screening with low-dose chest CT without contrast in 12 months. 2. Three-vessel coronary atherosclerosis. 3. Chronic calcific pancreatitis. 4. Aortic Atherosclerosis (ICD10-I70.0) and Emphysema (ICD10-J43.9). -overall CT stable, he was referred to cardiology in the past for coronary findings but did not follow up and does not want to now either. He is taking crestor . He has not been following up with GI either for his chronic calcific pancreatitis. -only taking hydralazine  Morning and midday dose, states he wont take evening dose because he doesn't think he needs it. States he has not had any headaches/swimmy head feelings which he states alerts him to high BP. Discussed monitoring BP at home as he likely may not be symptomatic with elevated BP until very high. BP did not improve in office. -Discussed elevated microalbumin as sign of renal function decline, he is on ACEI. Declines nephrology or diabetes medications and A1c is in acceptable range overall  Current Medication: Outpatient Encounter Medications as of 12/26/2023  Medication Sig   albuterol  (VENTOLIN  HFA) 108 (90 Base) MCG/ACT inhaler INHALE 1 PUFF INTO THE LUNGS EVERY 6 HOURS AS NEEDED FOR WHEEZING OR SHORTNESS OF BREATH (Patient taking differently: Inhale 1 puff into the lungs every 6  (six) hours as needed for wheezing or shortness of breath.)   Alcohol  Swabs  PADS Use with cleaning area to test blood sugar twice a day   allopurinol  (ZYLOPRIM ) 100 MG tablet Take 1 tablet (100 mg total) by mouth daily.   amLODipine  (NORVASC ) 5 MG tablet TAKE 1 TABLET(5 MG) BY MOUTH DAILY   Blood Glucose Calibration (TRUE METRIX LEVEL 1) Low SOLN Use as directed to check blood sugar twice a day TRUE METRIX LEVEL 1 CTRL SOLN   escitalopram  (LEXAPRO ) 10 MG tablet TAKE 1 TABLET(10 MG) BY MOUTH DAILY   fluticasone  (FLONASE ) 50 MCG/ACT nasal spray INSTILL 1 SPRAY IN EACH NOSTRIL EVERY DAY AS NEEDED   hydrALAZINE  (APRESOLINE ) 100 MG tablet Take 1 tablet (100 mg total) by mouth 3 (three) times daily.   losartan -hydrochlorothiazide  (HYZAAR) 100-25 MG tablet TAKE 1 TABLET BY MOUTH DAILY   meloxicam  (MOBIC ) 7.5 MG tablet TAKE 1 TABLET(7.5 MG) BY MOUTH DAILY AS NEEDED   omeprazole  (PRILOSEC) 40 MG capsule Take 40 mg by mouth daily.   rosuvastatin  (CRESTOR ) 5 MG tablet TAKE 1 TABLET(5 MG) BY MOUTH DAILY   spironolactone  (ALDACTONE ) 25 MG tablet TAKE 1 TABLET(25 MG) BY MOUTH DAILY   TRUEplus Lancets 30G MISC Use to test blood sugar twice a day   ZENPEP  10000-32000 units CPEP Take by mouth.   No facility-administered encounter medications on file as of 12/26/2023.    Surgical History: Past Surgical History:  Procedure Laterality Date   CAROTID STENT  2010   Vascular bypass graft      Medical History: Past Medical History:  Diagnosis Date   Diabetes mellitus (  HCC) 05/22/2015   Gout 05/22/2015   Hypertension     Family History: Family History  Problem Relation Age of Onset   Arthritis Mother     Social History   Socioeconomic History   Marital status: Married    Spouse name: Not on file   Number of children: Not on file   Years of education: Not on file   Highest education level: Not on file  Occupational History   Not on file  Tobacco Use   Smoking status: Every Day    Current  packs/day: 1.00    Average packs/day: 1 pack/day for 55.0 years (55.0 ttl pk-yrs)    Types: Cigarettes   Smokeless tobacco: Never   Tobacco comments:    1 pack daily  Substance and Sexual Activity   Alcohol  use: Yes    Alcohol /week: 18.0 standard drinks of alcohol     Types: 18 Cans of beer per week   Drug use: No   Sexual activity: Not on file  Other Topics Concern   Not on file  Social History Narrative   Not on file   Social Drivers of Health   Financial Resource Strain: Medium Risk (07/28/2023)   Received from [facility]   Overall Financial Resource Strain (CARDIA)    Difficulty of Paying Living Expenses: Somewhat hard  Food Insecurity: Food Insecurity Present (07/28/2023)   Received from [facility]   Hunger Vital Sign    Within the past 12 months, you worried that your food would run out before you got the money to buy more.: Sometimes true    Within the past 12 months, the food you bought just didn't last and you didn't have money to get more.: Sometimes true  Transportation Needs: No Transportation Needs (07/28/2023)   Received from [facility] - Transportation    In the past 12 months, has lack of transportation kept you from medical appointments or from getting medications?: No    Lack of Transportation (Non-Medical): No  Physical Activity: Sufficiently Active (07/28/2023)   Received from [facility]   Exercise Vital Sign    On average, how many days per week do you engage in moderate to strenuous exercise (like a brisk walk)?: 5 days    On average, how many minutes do you engage in exercise at this level?: 30 min  Stress: No Stress Concern Present (07/28/2023)   Received from [facility] - Occupational Stress Questionnaire    Feeling of Stress : Not at all  Social Connections: Socially Isolated (07/28/2023)   Received from [facility]   Social Connection and Isolation Panel    In a typical week, how many times do you talk on the phone with family, friends, or neighbors?: Once a week    How often do you get together with friends or relatives?: Never    How often do you attend church or religious services?: Never    Do you belong to any clubs or organizations such as church groups, unions, fraternal or athletic groups, or school groups?: No    How often do you attend meetings of the clubs or organizations you belong to?: Never    Are you married, widowed, divorced, separated, never married, or living with a partner?: Married  Intimate Partner Violence: Not At Risk (05/05/2022)   Humiliation, Afraid, Rape, and Kick questionnaire    Fear of  Current or Ex-Partner: No    Emotionally Abused: No    Physically Abused: No    Sexually Abused: No      Review of Systems  Constitutional:  Negative for chills, fatigue and unexpected weight change.  HENT:  Positive for postnasal drip. Negative for congestion, rhinorrhea, sneezing and sore throat.   Eyes:  Negative for redness.  Respiratory:  Negative for cough, chest tightness and shortness of breath.   Cardiovascular:  Negative for chest pain and palpitations.  Gastrointestinal:  Negative for abdominal pain, constipation, diarrhea, nausea and vomiting.  Genitourinary:  Negative for dysuria and frequency.  Musculoskeletal:  Positive for arthralgias. Negative for back pain, joint swelling and neck pain.  Skin:  Negative for rash.  Neurological: Negative.  Negative for tremors and numbness.  Hematological:  Negative for adenopathy. Does not bruise/bleed easily.  Psychiatric/Behavioral:  Negative for behavioral problems (Depression), sleep disturbance and suicidal ideas. The patient is not nervous/anxious.     Vital Signs: BP (!) 148/90   Pulse 74   Temp 98.5 F (36.9 C)   Resp 16   Ht 5' 7 (1.702 m)   Wt 154 lb (69.9 kg)   SpO2 99%   BMI 24.12  kg/m    Physical Exam Vitals and nursing note reviewed.  Constitutional:      General: He is not in acute distress.    Appearance: He is well-developed and normal weight. He is not diaphoretic.  HENT:     Head: Normocephalic and atraumatic.  Eyes:     Extraocular Movements: Extraocular movements intact.  Neck:     Thyroid : No thyromegaly.     Vascular: No JVD.     Trachea: No tracheal deviation.  Cardiovascular:     Rate and Rhythm: Normal rate and regular rhythm.     Heart sounds: Normal heart sounds. No murmur heard.    No friction rub. No gallop.  Pulmonary:     Effort: Pulmonary effort is normal. No respiratory distress.     Breath sounds: No wheezing or rales.  Chest:     Chest wall: No tenderness.  Musculoskeletal:        General: Normal range of motion.  Skin:    General: Skin is warm and dry.  Neurological:     Mental Status: He is alert and oriented to person, place, and time.  Psychiatric:        Thought Content: Thought content normal.        Judgment: Judgment normal.        Assessment/Plan: 1. White coat syndrome with diagnosis of hypertension (Primary) Always more elevated in office, but pt has not checked BP at home and will do so now. May need to increase amlodipine  vs take third dose of hydralazine  pending BP log.   2. Type 2 diabetes mellitus with other circulatory complication, without long-term current use of insulin  (HCC) - POCT HgB A1C is 6.9 which is stable from before and overall controlled. Did discuss considering medication for additional control and benefits including for renal function, but he declines. He also declines nephrology referral for elevated microalbumin.  3. Flu vaccine need - Influenza, MDCK, trivalent, PF(Flucelvax egg-free)  4. Cigarette nicotine dependence with other nicotine-induced disorder Work on cessation  5. Aortic atherosclerosis (HCC) Continue crestor   6. Atherosclerosis of native coronary artery of native  heart without angina pectoris Continue crestor , pt declines cardiology follow up   General Counseling: Rooney verbalizes understanding of the findings of todays visit and agrees  with plan of treatment. I have discussed any further diagnostic evaluation that may be needed or ordered today. We also reviewed his medications today. he has been encouraged to call the office with any questions or concerns that should arise related to todays visit.    Orders Placed This Encounter  Procedures   Influenza, MDCK, trivalent, PF(Flucelvax egg-free)   POCT HgB A1C    No orders of the defined types were placed in this encounter.   This patient was seen by Tinnie Pro, PA-C in collaboration with Dr. Sigrid Bathe as a part of collaborative care agreement.   Total time spent:30 Minutes Time spent includes review of chart, medications, test results, and follow up plan with the patient.      Dr Fozia M Khan Internal medicine

## 2024-01-02 ENCOUNTER — Ambulatory Visit: Admitting: Physician Assistant

## 2024-01-26 ENCOUNTER — Ambulatory Visit: Admitting: Physician Assistant

## 2024-01-30 ENCOUNTER — Other Ambulatory Visit
Admission: RE | Admit: 2024-01-30 | Discharge: 2024-01-30 | Disposition: A | Discharge status: Home / Self Care | Attending: Physician Assistant | Admitting: Physician Assistant

## 2024-01-30 DIAGNOSIS — E785 Hyperlipidemia, unspecified: Secondary | ICD-10-CM | POA: Diagnosis not present

## 2024-01-30 DIAGNOSIS — R7989 Other specified abnormal findings of blood chemistry: Secondary | ICD-10-CM | POA: Insufficient documentation

## 2024-01-30 DIAGNOSIS — R5383 Other fatigue: Secondary | ICD-10-CM | POA: Diagnosis not present

## 2024-01-30 LAB — COMPREHENSIVE METABOLIC PANEL WITH GFR
ALT: 30 U/L (ref 0–44)
AST: 26 U/L (ref 15–41)
Albumin: 3.9 g/dL (ref 3.5–5.0)
Alkaline Phosphatase: 63 U/L (ref 38–126)
Anion gap: 9 (ref 5–15)
BUN: 18 mg/dL (ref 8–23)
CO2: 24 mmol/L (ref 22–32)
Calcium: 8.4 mg/dL — ABNORMAL LOW (ref 8.9–10.3)
Chloride: 104 mmol/L (ref 98–111)
Creatinine, Ser: 1.03 mg/dL (ref 0.61–1.24)
GFR, Estimated: >60 mL/min (ref >60)
Glucose, Bld: 166 mg/dL — ABNORMAL HIGH (ref 70–99)
Potassium: 3.3 mmol/L — ABNORMAL LOW (ref 3.5–5.1)
Sodium: 137 mmol/L (ref 135–145)
Total Bilirubin: 1.3 mg/dL — ABNORMAL HIGH (ref 0.0–1.2)
Total Protein: 7.5 g/dL (ref 6.5–8.1)

## 2024-01-30 LAB — CBC WITH DIFFERENTIAL/PLATELET
Abs Immature Granulocytes: 0.02 K/uL (ref 0.00–0.07)
Basophils Absolute: 0 K/uL (ref 0.0–0.1)
Basophils Relative: 0 %
Eosinophils Absolute: 0.1 K/uL (ref 0.0–0.5)
Eosinophils Relative: 2 %
HCT: 37.7 % — ABNORMAL LOW (ref 39.0–52.0)
Hemoglobin: 12.2 g/dL — ABNORMAL LOW (ref 13.0–17.0)
Immature Granulocytes: 0 %
Lymphocytes Relative: 23 %
Lymphs Abs: 1.4 K/uL (ref 0.7–4.0)
MCH: 30.9 pg (ref 26.0–34.0)
MCHC: 32.4 g/dL (ref 30.0–36.0)
MCV: 95.4 fL (ref 80.0–100.0)
Monocytes Absolute: 0.6 K/uL (ref 0.1–1.0)
Monocytes Relative: 9 %
Neutro Abs: 4 K/uL (ref 1.7–7.7)
Neutrophils Relative %: 66 %
Platelets: 111 K/uL — ABNORMAL LOW (ref 150–400)
RBC: 3.95 MIL/uL — ABNORMAL LOW (ref 4.22–5.81)
RDW: 12.4 % (ref 11.5–15.5)
WBC: 6.1 K/uL (ref 4.0–10.5)
nRBC: 0 % (ref 0.0–0.2)

## 2024-01-30 LAB — T4, FREE: Free T4: 0.91 ng/dL (ref 0.61–1.12)

## 2024-01-30 LAB — LIPID PANEL
Cholesterol: 146 mg/dL (ref 0–200)
HDL: 70 mg/dL (ref >40)
LDL Cholesterol: 63 mg/dL (ref 0–99)
Total CHOL/HDL Ratio: 2.1 ratio
Triglycerides: 63 mg/dL (ref <150)
VLDL: 13 mg/dL (ref 0–40)

## 2024-01-30 LAB — TSH: TSH: 2.108 u[IU]/mL (ref 0.350–4.500)

## 2024-02-02 ENCOUNTER — Ambulatory Visit (INDEPENDENT_AMBULATORY_CARE_PROVIDER_SITE_OTHER): Admitting: Physician Assistant

## 2024-02-02 ENCOUNTER — Other Ambulatory Visit: Payer: Self-pay

## 2024-02-02 ENCOUNTER — Encounter: Payer: Self-pay | Admitting: Physician Assistant

## 2024-02-02 VITALS — BP 160/72 | HR 80 | Temp 98.0°F | Resp 16 | Ht 67.0 in | Wt 160.2 lb

## 2024-02-02 DIAGNOSIS — E876 Hypokalemia: Secondary | ICD-10-CM | POA: Diagnosis not present

## 2024-02-02 DIAGNOSIS — I10 Essential (primary) hypertension: Secondary | ICD-10-CM

## 2024-02-02 DIAGNOSIS — K861 Other chronic pancreatitis: Secondary | ICD-10-CM

## 2024-02-02 DIAGNOSIS — D649 Anemia, unspecified: Secondary | ICD-10-CM

## 2024-02-02 DIAGNOSIS — D696 Thrombocytopenia, unspecified: Secondary | ICD-10-CM

## 2024-02-02 MED ORDER — CALCIUM 600+D PLUS MINERALS 600-400 MG-UNIT PO CHEW: 600.0000 mg | CHEWABLE_TABLET | Freq: Every day | ORAL | 1 refills | Status: AC

## 2024-02-02 MED ORDER — SPIRONOLACTONE 50 MG PO TABS: 50.0000 mg | ORAL_TABLET | Freq: Every day | ORAL | 3 refills | Status: AC

## 2024-02-02 NOTE — Progress Notes (Signed)
 [facility] [address] [place], [place] [address]  Internal MEDICINE  Office Visit Note  Patient Name: Donald Scott  [id number]  [id number]  Date of Service: 02/02/2024  Chief Complaint  Patient presents with   Follow-up    Review labs   Hypertension   Diabetes    HPI Pt is here for routine follow -Stays busy, working every day and helping take care of 73yo -sleeping ok -not checking BP at home everyday due to being busy -labs reviewed: low hemoglobin and platelets fairly chronic, denies easy bleeding or bruising. Declines hematology referral. Calcium  and potassium a little low. Was doing ensure which helped but can't afford to do this every time -pt reports hydrating well -no dark or tarry stools -hx of chronic pancreatitis, but is not seeing GI currently and does not desire follow up at this time  Current Medication: Outpatient Encounter Medications as of 02/02/2024  Medication Sig   albuterol  (VENTOLIN  HFA) 108 (90 Base) MCG/ACT inhaler INHALE 1 PUFF INTO THE LUNGS EVERY 6 HOURS AS NEEDED FOR WHEEZING OR SHORTNESS OF BREATH (Patient taking differently: Inhale 1 puff into the lungs every 6 (six) hours as needed for wheezing or shortness of breath.)   allopurinol  (ZYLOPRIM ) 100 MG tablet Take 1 tablet (100 mg total) by mouth daily.   amLODipine  (NORVASC ) 5 MG tablet TAKE 1 TABLET(5 MG) BY MOUTH DAILY   escitalopram  (LEXAPRO ) 10 MG tablet TAKE 1 TABLET(10 MG) BY MOUTH DAILY   fluticasone  (FLONASE ) 50 MCG/ACT nasal spray INSTILL 1 SPRAY IN EACH NOSTRIL EVERY DAY AS NEEDED   hydrALAZINE  (APRESOLINE ) 100 MG tablet Take 1 tablet (100 mg total) by mouth 3 (three) times daily.   losartan -hydrochlorothiazide  (HYZAAR) 100-25 MG tablet TAKE 1 TABLET BY MOUTH DAILY   meloxicam  (MOBIC ) 7.5 MG tablet TAKE 1 TABLET(7.5 MG) BY MOUTH DAILY AS NEEDED   omeprazole  (PRILOSEC) 40 MG capsule Take 40 mg by mouth daily.   rosuvastatin  (CRESTOR ) 5 MG tablet TAKE 1 TABLET(5 MG) BY  MOUTH DAILY   spironolactone  (ALDACTONE ) 50 MG tablet Take 1 tablet (50 mg total) by mouth daily.   TRUEplus Lancets 30G MISC Use to test blood sugar twice a day   [DISCONTINUED] Alcohol  Swabs  PADS Use with cleaning area to test blood sugar twice a day   [DISCONTINUED] Blood Glucose Calibration (TRUE METRIX LEVEL 1) Low SOLN Use as directed to check blood sugar twice a day TRUE METRIX LEVEL 1 CTRL SOLN   [DISCONTINUED] spironolactone  (ALDACTONE ) 25 MG tablet TAKE 1 TABLET(25 MG) BY MOUTH DAILY   [DISCONTINUED] ZENPEP  10000-32000 units CPEP Take by mouth.   No facility-administered encounter medications on file as of 02/02/2024.    Surgical History: Past Surgical History:  Procedure Laterality Date   CAROTID STENT  2010   Vascular bypass graft      Medical History: Past Medical History:  Diagnosis Date   Diabetes mellitus (HCC) 05/22/2015   Gout 05/22/2015   Hypertension     Family History: Family History  Problem Relation Age of Onset   Arthritis Mother     Social History   Socioeconomic History   Marital status: Married    Spouse name: Not on file   Number of children: Not on file   Years of education: Not on file   Highest education level: Not on file  Occupational History   Not on file  Tobacco Use   Smoking status: Every Day    Current packs/day: 1.00    Average packs/day:  1 pack/day for 55.0 years (55.0 ttl pk-yrs)    Types: Cigarettes   Smokeless tobacco: Never   Tobacco comments:    1 pack daily  Substance and Sexual Activity   Alcohol  use: Yes    Alcohol /week: 18.0 standard drinks of alcohol     Types: 18 Cans of beer per week   Drug use: No   Sexual activity: Not on file  Other Topics Concern   Not on file  Social History Narrative   Not on file   Social Drivers of Health   Financial Resource Strain: Medium Risk (07/28/2023)   Received from [facility]   Overall Financial Resource Strain (CARDIA)    Difficulty of Paying Living  Expenses: Somewhat hard  Food Insecurity: Food Insecurity Present (07/28/2023)   Received from [facility]   Hunger Vital Sign    Within the past 12 months, you worried that your food would run out before you got the money to buy more.: Sometimes true    Within the past 12 months, the food you bought just didn't last and you didn't have money to get more.: Sometimes true  Transportation Needs: No Transportation Needs (07/28/2023)   Received from [facility] - Transportation    In the past 12 months, has lack of transportation kept you from medical appointments or from getting medications?: No    Lack of Transportation (Non-Medical): No  Physical Activity: Sufficiently Active (07/28/2023)   Received from [facility]   Exercise Vital Sign    On average, how many days per week do you engage in moderate to strenuous exercise (like a brisk walk)?: 5 days    On average, how many minutes do you engage in exercise at this level?: 30 min  Stress: No Stress Concern Present (07/28/2023)   Received from [facility] of Occupational Health - Occupational Stress Questionnaire    Feeling of Stress : Not at all  Social Connections: Socially Isolated (07/28/2023)   Received from [facility]   Social Connection and Isolation Panel    In a typical week, how many times do you talk on the phone with family, friends, or neighbors?: Once a week    How often do you get together with friends or relatives?: Never    How often do you attend church or religious services?: Never    Do you belong to any clubs or organizations such as church groups, unions, fraternal or athletic groups, or school groups?: No    How often do you attend meetings of the clubs or organizations you belong to?: Never    Are you married, widowed, divorced, separated, never married, or living with a partner?: Married  Intimate  Partner Violence: Not At Risk (05/05/2022)   Humiliation, Afraid, Rape, and Kick questionnaire    Fear of Current or Ex-Partner: No    Emotionally Abused: No    Physically Abused: No    Sexually Abused: No      Review of Systems  Constitutional:  Negative for chills, fatigue and unexpected weight change.  HENT:  Positive for postnasal drip. Negative for congestion, rhinorrhea, sneezing and sore throat.   Eyes:  Negative for redness.  Respiratory:  Negative for cough, chest tightness and shortness of breath.   Cardiovascular:  Negative for chest pain and palpitations.  Gastrointestinal:  Negative for abdominal pain, constipation, diarrhea, nausea and vomiting.  Genitourinary:  Negative for  dysuria and frequency.  Musculoskeletal:  Negative for back pain, joint swelling and neck pain.  Skin:  Negative for rash.  Neurological: Negative.  Negative for tremors and numbness.  Hematological:  Negative for adenopathy. Does not bruise/bleed easily.  Psychiatric/Behavioral:  Negative for behavioral problems (Depression), sleep disturbance and suicidal ideas. The patient is not nervous/anxious.     Vital Signs: BP (!) 160/72   Pulse 80   Temp 98 F (36.7 C)   Resp 16   Ht 5' 7 (1.702 m)   Wt 160 lb 3.2 oz (72.7 kg)   SpO2 98%   BMI 25.09 kg/m    Physical Exam Vitals and nursing note reviewed.  Constitutional:      General: He is not in acute distress.    Appearance: He is well-developed and normal weight. He is not diaphoretic.  HENT:     Head: Normocephalic and atraumatic.  Eyes:     Extraocular Movements: Extraocular movements intact.  Neck:     Thyroid : No thyromegaly.     Vascular: No JVD.     Trachea: No tracheal deviation.  Cardiovascular:     Rate and Rhythm: Normal rate and regular rhythm.     Heart sounds: Normal heart sounds. No murmur heard.    No friction rub. No gallop.  Pulmonary:     Effort: Pulmonary effort is normal. No respiratory distress.     Breath  sounds: No wheezing or rales.  Chest:     Chest wall: No tenderness.  Musculoskeletal:        General: Normal range of motion.  Skin:    General: Skin is warm and dry.  Neurological:     Mental Status: He is alert and oriented to person, place, and time.  Psychiatric:        Thought Content: Thought content normal.        Judgment: Judgment normal.        Assessment/Plan: 1. Essential hypertension (Primary) Will increase spironolactone  to help BP further as well as potassium. Will monitor. Pt advised to make sure he stays hydrated - spironolactone  (ALDACTONE ) 50 MG tablet; Take 1 tablet (50 mg total) by mouth daily.  Dispense: 90 tablet; Refill: 3  2. Low serum potassium Will recheck labs with increased spironolactone  - Comprehensive metabolic panel with GFR  3. Low platelet count Chronic and patient declines hematology or further evaluation at this time. Will monitor - CBC w/Diff/Platelet  4. Low hemoglobin Fairly chronic and patient declines hematology or further evaluation at this time. Will monitor. Denies and blood in stool or dark/tarry stools - CBC w/Diff/Platelet  5. Chronic calcific pancreatitis [facility]) Seen again on recent CT chest screening. Patient declines follow up with GI at this time   General Counseling: Marsha oakland understanding of the findings of todays visit and agrees with plan of treatment. I have discussed any further diagnostic evaluation that may be needed or ordered today. We also reviewed his medications today. he has been encouraged to call the office with any questions or concerns that should arise related to todays visit.    Orders Placed This Encounter  Procedures   Comprehensive metabolic panel with GFR   CBC w/Diff/Platelet    Meds ordered this encounter  Medications   spironolactone  (ALDACTONE ) 50 MG tablet    Sig: Take 1 tablet (50 mg total) by mouth daily.    Dispense:  90 tablet    Refill:  3    This patient was seen by  Tinnie  Shubh Chiara, PA-C in collaboration with Dr. Sigrid Bathe as a part of collaborative care agreement.   Total time spent:30 Minutes Time spent includes review of chart, medications, test results, and follow up plan with the patient.      Dr Fozia M Khan Internal medicine

## 2024-02-20 ENCOUNTER — Other Ambulatory Visit: Payer: Self-pay | Admitting: Physician Assistant

## 2024-02-20 DIAGNOSIS — I10 Essential (primary) hypertension: Secondary | ICD-10-CM

## 2024-02-20 DIAGNOSIS — I700 Atherosclerosis of aorta: Secondary | ICD-10-CM

## 2024-03-15 ENCOUNTER — Ambulatory Visit: Admitting: Physician Assistant

## 2024-03-23 NOTE — Progress Notes (Signed)
" ° °  03/23/2024  Patient ID: Donald Scott, male   DOB: 1950-07-10, 73 y.o.   MRN: [id number]  Pharmacy Quality Measure Review  This patient is appearing on a report for being at risk of failing the adherence measure for cholesterol (statin) and hypertension (ACEi/ARB) medications this calendar year.   Medication: Losartan /hydrochlorothiazide  and rosuvastatin  Last fill date: 02/22/24 for 90 day supply  Insurance report was not up to date. No action needed at this time.   "

## 2024-04-30 ENCOUNTER — Ambulatory Visit: Payer: Medicare HMO | Admitting: Physician Assistant

## 2024-05-07 ENCOUNTER — Ambulatory Visit: Admitting: Physician Assistant

## 2024-06-04 ENCOUNTER — Ambulatory Visit: Admitting: Physician Assistant
